# Patient Record
Sex: Male | Born: 1955 | Race: White | Hispanic: No | Marital: Married | Smoking: Former smoker
Health system: Southern US, Community
[De-identification: ages and names within clinical notes are randomized; demographics above are authoritative.]

## PROBLEM LIST (undated history)

## (undated) DIAGNOSIS — E119 Type 2 diabetes mellitus without complications: Secondary | ICD-10-CM

## (undated) DIAGNOSIS — D638 Anemia in other chronic diseases classified elsewhere: Secondary | ICD-10-CM

## (undated) DIAGNOSIS — I482 Chronic atrial fibrillation, unspecified: Secondary | ICD-10-CM

## (undated) DIAGNOSIS — I2510 Atherosclerotic heart disease of native coronary artery without angina pectoris: Secondary | ICD-10-CM

## (undated) DIAGNOSIS — K551 Chronic vascular disorders of intestine: Secondary | ICD-10-CM

## (undated) DIAGNOSIS — G4733 Obstructive sleep apnea (adult) (pediatric): Secondary | ICD-10-CM

## (undated) DIAGNOSIS — E1159 Type 2 diabetes mellitus with other circulatory complications: Secondary | ICD-10-CM

## (undated) DIAGNOSIS — E1169 Type 2 diabetes mellitus with other specified complication: Secondary | ICD-10-CM

## (undated) DIAGNOSIS — I152 Hypertension secondary to endocrine disorders: Secondary | ICD-10-CM

---

## 2010-10-25 ENCOUNTER — Encounter: Payer: Self-pay | Admitting: Family Medicine

## 2010-10-25 ENCOUNTER — Inpatient Hospital Stay (INDEPENDENT_AMBULATORY_CARE_PROVIDER_SITE_OTHER)
Admission: RE | Admit: 2010-10-25 | Discharge: 2010-10-25 | Disposition: A | Discharge status: Home / Self Care | Payer: PRIVATE HEALTH INSURANCE | Source: Ambulatory Visit | Attending: Family Medicine | Admitting: Family Medicine

## 2010-10-25 DIAGNOSIS — Z23 Encounter for immunization: Secondary | ICD-10-CM

## 2010-10-25 DIAGNOSIS — S61209A Unspecified open wound of unspecified finger without damage to nail, initial encounter: Secondary | ICD-10-CM

## 2010-11-04 ENCOUNTER — Encounter: Payer: Self-pay | Admitting: Family Medicine

## 2010-11-04 ENCOUNTER — Inpatient Hospital Stay (INDEPENDENT_AMBULATORY_CARE_PROVIDER_SITE_OTHER)
Admission: RE | Admit: 2010-11-04 | Discharge: 2010-11-04 | Disposition: A | Discharge status: Home / Self Care | Payer: PRIVATE HEALTH INSURANCE | Source: Ambulatory Visit | Attending: Family Medicine | Admitting: Family Medicine

## 2010-11-04 DIAGNOSIS — S61209A Unspecified open wound of unspecified finger without damage to nail, initial encounter: Secondary | ICD-10-CM | POA: Insufficient documentation

## 2010-12-28 NOTE — Progress Notes (Signed)
Summary: SUTURE REMOVAL...WSE   Vital Signs:  Patient Profile:   55 Years Old Male CC:      suture removal Height:     72 inches O2 Sat:      96 % O2 treatment:    Room Air Temp:     98.2 degrees F oral Pulse rate:   84 / minute Resp:     16 per minute BP sitting:   121 / 83  (left arm) Cuff size:   large  Vitals Entered By: Lajean Saver RN (November 04, 2010 5:30 PM)                  Prior Medication List:  * COUMADIN 5MG  daily   Current Allergies: No known allergies History of Present Illness Chief Complaint: suture removal History of Present Illness: Sutuere recheck and removal.  Current Problems: LACERATION, RIGHT THUMB (ICD-883.0) LACERATION, RIGHT THUMB (ICD-883.0)   Current Meds * COUMADIN 5MG  daily  REVIEW OF SYSTEMS Constitutional Symptoms      Denies fever, chills, night sweats, weight loss, weight gain, and fatigue.  Eyes       Denies change in vision, eye pain, eye discharge, glasses, contact lenses, and eye surgery. Ear/Nose/Throat/Mouth       Denies hearing loss/aids, change in hearing, ear pain, ear discharge, dizziness, frequent runny nose, frequent nose bleeds, sinus problems, sore throat, hoarseness, and tooth pain or bleeding.  Respiratory       Denies dry cough, productive cough, wheezing, shortness of breath, asthma, bronchitis, and emphysema/COPD.  Cardiovascular       Denies murmurs, chest pain, and tires easily with exhertion.    Gastrointestinal       Denies stomach pain, nausea/vomiting, diarrhea, constipation, blood in bowel movements, and indigestion. Genitourniary       Denies painful urination, blood or discharge from penis, kidney stones, and loss of urinary control. Neurological       Denies paralysis, seizures, and fainting/blackouts. Musculoskeletal       Denies muscle pain, joint pain, joint stiffness, decreased range of motion, redness, swelling, muscle weakness, and gout.  Skin       Denies bruising, unusual mles/lumps  or sores, and hair/skin or nail changes.  Psych       Denies mood changes, temper/anger issues, anxiety/stress, speech problems, depression, and sleep problems. Other Comments: Sutures removed from right thumb   Past History:  Social History: Last updated: 10/25/2010 Current Smoker Alcohol use-no Drug use-no  Risk Factors: Smoking Status: current (10/25/2010)  Past Medical History: Reviewed history from 10/25/2010 and no changes required. A fib cvhd embolism heart stint (1)  Past Surgical History: Reviewed history from 10/25/2010 and no changes required. leg surgery for embolism  Family History: Reviewed history from 10/25/2010 and no changes required. denies  Social History: Reviewed history from 10/25/2010 and no changes required. Current Smoker Alcohol use-no Drug use-no Physical Exam General appearance: well developed, well nourished, no acute distress Skin: healing sutures in R thumb Assessment Problems:   LACERATION, RIGHT THUMB (ICD-883.0) New Problems: LACERATION, RIGHT THUMB (ICD-883.0)   Plan New Orders: No Charge Patient Arrived (NCPA0) [NCPA0] Follow Up: Follow up on an as needed basis  The patient and/or caregiver has been counseled thoroughly with regard to medications prescribed including dosage, schedule, interactions, rationale for use, and possible side effects and they verbalize understanding.  Diagnoses and expected course of recovery discussed and will return if not improved as expected or if the condition worsens. Patient and/or caregiver verbalized  understanding.   PROCEDURE:  Suture Removal Site: R thumb Sutures removed without difficulty. No drainage from incision.  Patient Instructions: 1)  Please schedule a follow-up appointment as needed. 2)  Please schedule an appointment with your primary doctor in :as needed 3)  Tobacco is very bad for your health and your loved ones! You Should stop smoking!. 4)  Stop Smoking Tips:  Choose a Quit date. Cut down before the Quit date. decide what you will do as a substitute when you feel the urge to smoke(gum,toothpick,exercise).  Orders Added: 1)  No Charge Patient Arrived (NCPA0) [NCPA0]

## 2010-12-28 NOTE — Progress Notes (Signed)
Summary: CUT ON THUMB   Vital Signs:  Patient Profile:   55 Years Old Male CC:      right thumb laceration Height:     72 inches Weight:      335 pounds O2 Sat:      95 % O2 treatment:    Room Air Temp:     97.8 degrees F oral Pulse rate:   88 / minute Resp:     18 per minute BP sitting:   144 / 81  (right arm) Cuff size:   large  Pt. in pain?   yes  Vitals Entered By: Lavell Islam RN (October 25, 2010 6:26 PM)                   Updated Prior Medication List: * COUMADIN 5MG  daily  Current Allergies: No known allergies History of Present Illness Chief Complaint: right thumb laceration History of Present Illness:  Subjective:  Patient complains of cutting his right thumb tip while peeling vegetables this afternoon.  Does not remember his last tetanus shot.  Takes Coumadin for Atrial fib.  REVIEW OF SYSTEMS Constitutional Symptoms      Denies fever, chills, night sweats, weight loss, weight gain, and fatigue.  Eyes       Denies change in vision, eye pain, eye discharge, glasses, contact lenses, and eye surgery. Ear/Nose/Throat/Mouth       Denies hearing loss/aids, change in hearing, ear pain, ear discharge, dizziness, frequent runny nose, frequent nose bleeds, sinus problems, sore throat, hoarseness, and tooth pain or bleeding.  Respiratory       Denies dry cough, productive cough, wheezing, shortness of breath, asthma, bronchitis, and emphysema/COPD.  Cardiovascular       Denies murmurs, chest pain, and tires easily with exhertion.    Gastrointestinal       Denies stomach pain, nausea/vomiting, diarrhea, constipation, blood in bowel movements, and indigestion. Genitourniary       Denies painful urination, kidney stones, and loss of urinary control. Neurological       Denies paralysis, seizures, and fainting/blackouts. Musculoskeletal       Denies muscle pain, joint pain, joint stiffness, decreased range of motion, redness, swelling, muscle weakness, and  gout.  Skin       Denies bruising, unusual mles/lumps or sores, and hair/skin or nail changes.  Psych       Denies mood changes, temper/anger issues, anxiety/stress, speech problems, depression, and sleep problems. Other Comments: lacerated right thumb with potato peeler   Past History:  Past Medical History: A fib cvhd embolism heart stint (1)  Past Surgical History: leg surgery for embolism  Family History: denies  Social History: Current Smoker Alcohol use-no Drug use-no Smoking Status:  current Drug Use:  no   Objective:  No acute distress  Right thumb tip:  1cm superficial skin flap at tip.  Wound clean without debris.  Distal neurovascular intact  Assessment New Problems: LACERATION, RIGHT THUMB (ICD-883.0)   Plan New Orders: Tdap => 67yrs IM [90715] Repair Superficial Wound(s) 2.5cm or <(scalp,neck,axillae,ext gent,trunk,extre) [12001] Admin 1st Vaccine [90471] New Patient Level III [99203] Services provided After hours-Weekends-Holidays [99051] Planning Comments:   Tdap administered Change bandage daily and apply Bacitracin ointment.  Return 10 days for suture removal. Return earlier for any signs of infection.   The patient and/or caregiver has been counseled thoroughly with regard to medications prescribed including dosage, schedule, interactions, rationale for use, and possible side effects and they verbalize understanding.  Diagnoses and  expected course of recovery discussed and will return if not improved as expected or if the condition worsens. Patient and/or caregiver verbalized understanding.   PROCEDURE:  Suture Site: Right thumb Size: 1cm flap Number of Lacerations: One Anesthesia: 0.5% bupivacaine Procedure: Procedure:  Laceration Repair Discussed benefits and risks of procedure and verbal consent obtained. Using sterile technique and digital 0.5% bupivacaine without epinephrine, cleansed wound with Betadine followed by copious lavage  with normal saline.  Wound carefully inspected for debris and foreign bodies; none found.  Wound closed with #5 , 5-0 interrupted nylon sutures.  Bacitracin and non-stick sterile compression dressing applied.  Wound precautions explained to patient.    Disposition: Home  Orders Added: 1)  Tdap => 71yrs IM [90715] 2)  Repair Superficial Wound(s) 2.5cm or <(scalp,neck,axillae,ext gent,trunk,extre) [12001] 3)  Admin 1st Vaccine [90471] 4)  New Patient Level III [40981] 5)  Services provided After hours-Weekends-Holidays [99051]   Immunizations Administered:  Tetanus Vaccine:    Vaccine Type: Tdap    Site: left deltoid    Mfr: boostrix    Dose: 0.5 ml    Route: IM    Given by: Lavell Islam RN    Exp. Date: 12/19/2011    Lot #: [id number]    VIS given: 12/13/07 version given October 25, 2010.   Immunizations Administered:  Tetanus Vaccine:    Vaccine Type: Tdap    Site: left deltoid    Mfr: boostrix    Dose: 0.5 ml    Route: IM    Given by: Lavell Islam RN    Exp. Date: 12/19/2011    Lot #: [id number]    VIS given: 12/13/07 version given October 25, 2010.

## 2011-01-05 DIAGNOSIS — G4730 Sleep apnea, unspecified: Secondary | ICD-10-CM | POA: Insufficient documentation

## 2011-01-05 DIAGNOSIS — G4733 Obstructive sleep apnea (adult) (pediatric): Secondary | ICD-10-CM | POA: Insufficient documentation

## 2011-11-02 DIAGNOSIS — I2510 Atherosclerotic heart disease of native coronary artery without angina pectoris: Secondary | ICD-10-CM | POA: Diagnosis present

## 2011-11-02 DIAGNOSIS — I10 Essential (primary) hypertension: Secondary | ICD-10-CM | POA: Diagnosis present

## 2011-11-02 DIAGNOSIS — I4891 Unspecified atrial fibrillation: Secondary | ICD-10-CM | POA: Diagnosis present

## 2019-04-16 DIAGNOSIS — K8500 Idiopathic acute pancreatitis without necrosis or infection: Secondary | ICD-10-CM | POA: Insufficient documentation

## 2019-06-10 DIAGNOSIS — E785 Hyperlipidemia, unspecified: Secondary | ICD-10-CM | POA: Diagnosis present

## 2019-06-10 DIAGNOSIS — F419 Anxiety disorder, unspecified: Secondary | ICD-10-CM | POA: Diagnosis present

## 2019-06-10 DIAGNOSIS — D638 Anemia in other chronic diseases classified elsewhere: Secondary | ICD-10-CM | POA: Diagnosis present

## 2019-06-10 DIAGNOSIS — E1169 Type 2 diabetes mellitus with other specified complication: Secondary | ICD-10-CM | POA: Diagnosis present

## 2020-03-07 DIAGNOSIS — Z8719 Personal history of other diseases of the digestive system: Secondary | ICD-10-CM | POA: Insufficient documentation

## 2020-04-23 DIAGNOSIS — K551 Chronic vascular disorders of intestine: Secondary | ICD-10-CM | POA: Diagnosis present

## 2020-05-08 DIAGNOSIS — K651 Peritoneal abscess: Secondary | ICD-10-CM | POA: Insufficient documentation

## 2020-06-29 DIAGNOSIS — R269 Unspecified abnormalities of gait and mobility: Secondary | ICD-10-CM | POA: Insufficient documentation

## 2020-07-14 DIAGNOSIS — I728 Aneurysm of other specified arteries: Secondary | ICD-10-CM | POA: Insufficient documentation

## 2020-07-25 ENCOUNTER — Other Ambulatory Visit: Payer: Self-pay | Admitting: Interventional Radiology

## 2020-07-25 ENCOUNTER — Ambulatory Visit
Admission: RE | Admit: 2020-07-25 | Discharge: 2020-07-25 | Disposition: A | Discharge status: Home / Self Care | Payer: PRIVATE HEALTH INSURANCE | Source: Ambulatory Visit | Attending: Anesthesiology | Admitting: Anesthesiology

## 2020-07-25 ENCOUNTER — Other Ambulatory Visit: Payer: Self-pay

## 2020-07-25 ENCOUNTER — Other Ambulatory Visit: Payer: Self-pay | Admitting: Anesthesiology

## 2020-07-25 DIAGNOSIS — I728 Aneurysm of other specified arteries: Secondary | ICD-10-CM

## 2020-07-25 HISTORY — PX: IR RADIOLOGIST EVAL & MGMT: IMG5224

## 2020-07-25 NOTE — Consult Note (Addendum)
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    He presents via telephone visit, accompanied by his daughter, Emogene Morgan.  Referring Physician(s): Sharma,Rachana  Patient Status: [facility] - Out-pt  History of Present Illness: Benjamin Fox is a 65 y.o. male with pertinent past medical history of mesenteric ischemia which prompted celiac and SMA stent placements on April 01, 2020 at [facility] by Vascular Surgery (Dr. Renard Matter).  His pain temporarily improved, then worsened soon after.  Further imaging demonstrated development of acute pancreatitis and thrombosis of both stents.  His pancreatitis was complicated by acute pancreatic fluid collections which evolved into walled-off necrosis.  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  Angiogram was attempted by [facility] from radial and femoral approaches without success identifying supply to the pseudoaneurysm from IMA/Arc of Riolan catheterization nor cannulation of the indwelling occluded celiac and SMA stents.  Given walled off necrosis, Gastroenterology is hesitant to intervene or drain endoscopically due to presence of pseudoaneurysm.  Given history of atrial fibrillation, he has been on anticoagulation for years, formerly coumadin, more recently Xarelto.  This was discontinued at recent hospital discharge due to development of the pseudoaneurysm, and he is now on 325 mg aspirin daily.    He states that over the past week he has been feeling gradually better.  He has persistent upper abdominal pain, but this is significantly less than while hospitalized.  His appetite is returning and he states he's able to tolerate all of the food he's tried thus far.  No nausea, vomiting, chest pain, cough, loss of  consciousness.  Additional past medical history: Atrial fibrillation OSA Leg compartment syndrome, residual drop foot (remote) CAD s/p PCI 2008 HTN - controlled with medications Hx of smoking, stopped several years ago    No past medical history on file.  Allergies: Patient has no allergy information on record.  Medications: Prior to Admission medications   Not on File     No family history on file.  Social History   Socioeconomic History   Marital status: Married    Spouse name: Not on file   Number of children: Not on file   Years of education: Not on file   Highest education level: Not on file  Occupational History   Not on file  Tobacco Use   Smoking status: Not on file   Smokeless tobacco: Not on file  Substance and Sexual Activity   Alcohol use: Not on file   Drug use: Not on file   Sexual activity: Not on file  Other Topics Concern   Not on file  Social History Narrative   Not on file   Social Determinants of Health   Financial Resource Strain: Not on file  Food Insecurity: Not on file  Transportation Needs: Not on file  Physical Activity: Not on file  Stress: Not on file  Social Connections: Not on file     Review of Systems: A 12 point ROS discussed and pertinent positives are indicated in the HPI above.  All other systems are negative.   Vital Signs: There were no vitals taken for this visit.  No physical examination performed in lieu of telephone visit.  Imaging: CTA 07/10/20     Labs:  CBC: No results for input(s): WBC, HGB, HCT, PLT in the last 8760 hours.  COAGS: No  results for input(s): INR, APTT in the last 8760 hours.  BMP: No results for input(s): NA, K, CL, CO2, GLUCOSE, BUN, CALCIUM, CREATININE, GFRNONAA, GFRAA in the last 8760 hours.  Invalid input(s): CMP  LIVER FUNCTION TESTS: No results for input(s): BILITOT, AST, ALT, ALKPHOS, PROT, ALBUMIN in the last 8760 hours.  TUMOR MARKERS: No results for input(s):  AFPTM, CEA, CA199, CHROMGRNA in the last 8760 hours.  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  Prior attempts at recanalizing the occluded stents as well as accessing the pseudoaneurysm via the Arc of Riolan were unsuccessful.    Given clinical improvement, I would like to get another CTA to evaluate any change in the pseudoaneurysm and pancreatic necrosis.  If spontaneous occlusion of the pseudoaneurysm has occurred, no intervention will be necessary.  If the pseudoaneurysm persists or has enlarged, attempt at angiogram and embolization could be pursued.  The patient and his daughter are in agreement with this plan.  Once the CTA is completed, I will review images and contact the patient to discuss the next best steps.    ADDENDUM (07/29/20): CTA done - there is complete resolution of pseudoaneurysm.  Will plan to follow up in clinic in 1 month.  If there is persistent or worsening abdominal pain, will entertain stent recan/relining.  Thank you for this interesting consult.  I greatly enjoyed meeting Benjamin Fox and look forward to participating in their care.  A copy of this report was sent to the requesting provider on this date.  Electronically Signed: Suzette Battiest, MD 07/25/2020, 10:44 AM   I spent a total of  80 Minutes  in telephone clinical consultation, greater than 50% of which was counseling/coordinating care for pancreatic pseudoaneurysm.

## 2020-07-29 ENCOUNTER — Ambulatory Visit (INDEPENDENT_AMBULATORY_CARE_PROVIDER_SITE_OTHER): Payer: BC Managed Care – PPO

## 2020-07-29 ENCOUNTER — Other Ambulatory Visit: Payer: Self-pay

## 2020-07-29 DIAGNOSIS — I728 Aneurysm of other specified arteries: Secondary | ICD-10-CM | POA: Diagnosis not present

## 2020-07-29 MED ORDER — IOHEXOL 350 MG/ML SOLN
100.0000 mL | Freq: Once | INTRAVENOUS | Status: AC | PRN
  Administered 2020-07-29: 100 mL via INTRAVENOUS

## 2020-08-04 ENCOUNTER — Other Ambulatory Visit: Payer: Self-pay | Admitting: Interventional Radiology

## 2020-08-04 DIAGNOSIS — K863 Pseudocyst of pancreas: Secondary | ICD-10-CM

## 2020-08-04 DIAGNOSIS — K551 Chronic vascular disorders of intestine: Secondary | ICD-10-CM

## 2020-08-28 ENCOUNTER — Encounter: Payer: Self-pay | Admitting: *Deleted

## 2020-08-28 ENCOUNTER — Other Ambulatory Visit: Payer: Self-pay

## 2020-08-28 ENCOUNTER — Ambulatory Visit
Admission: RE | Admit: 2020-08-28 | Discharge: 2020-08-28 | Disposition: A | Discharge status: Home / Self Care | Payer: BC Managed Care – PPO | Source: Ambulatory Visit | Attending: Interventional Radiology | Admitting: Interventional Radiology

## 2020-08-28 DIAGNOSIS — K863 Pseudocyst of pancreas: Secondary | ICD-10-CM

## 2020-08-28 DIAGNOSIS — K551 Chronic vascular disorders of intestine: Secondary | ICD-10-CM

## 2020-08-28 HISTORY — PX: IR RADIOLOGIST EVAL & MGMT: IMG5224

## 2020-08-28 NOTE — Progress Notes (Signed)
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  Prior attempts at recanalizing the occluded stents as well as accessing the pseudoaneurysm via the Arc of Riolan were unsuccessful.    Repeat CTA was performed on 07/29/20 which demonstrated resolution of pseudoaneurysm, improvement in walled-off necrosis and pancreatitis, with persistent occlusion of recently placed indwelling celiac and SMA stents.  Decision was made at that time to see how his abdominal pain and food tolerance progressed over the next month to help direct attempt at recanalization of the indwelling stents versus continued watchful waiting.  He presents today via virtual telephone clinic visit accompanied by his daughter, Benjamin Fox.    He endorses persistent stomach pain, not as bad as initial pain, which occurs approximately 80% of the time after eating, and always after eating fried foods.  He rates the pain as a 2-4/10.  He has not had to take any pain medication.  The pain resolves approximately 45 minutes to 1 hour after eating.  He has been able to tolerate food and has gained back about 15 lbs which he is happy with.  He states the pain is bearable, but annoying.  He is wary of it getting worse.  He has not had follow up with Gastroenterology or Vascular Surgery since his discharge from the hospital.  He continues to take aspirin, 81 mg daily.       No past medical history on file.   Allergies: Patient has no allergy information on record.  Medications: Prior to Admission medications   Not on File     No family history on file.  Social History    Socioeconomic History   Marital status: Married    Spouse name: Not on file   Number of children: Not on file   Years of education: Not on file   Highest education level: Not on file  Occupational History   Not on file  Tobacco Use   Smoking status: Not on file   Smokeless tobacco: Not on file  Substance and Sexual Activity   Alcohol use: Not on file   Drug use: Not on file   Sexual activity: Not on file  Other Topics Concern   Not on file  Social History Narrative   Not on file   Social Determinants of Health   Financial Resource Strain: Not on file  Food Insecurity: Not on file  Transportation Needs: Not on file  Physical Activity: Not on file  Stress: Not on file  Social Connections: Not on file     Vital Signs: There were no vitals taken for this visit.  No physical examination performed in lieu of telephone visit.  Imaging:  Celiac  = Express 6x27 mm SMA = Express 7x37 mm   Labs:  CBC: No results for input(s): WBC, HGB, HCT, PLT in the last 8760 hours.  COAGS: No results for input(s): INR, APTT in the last 8760 hours.  BMP: No results for input(s): NA, K, CL, CO2, GLUCOSE, BUN, CALCIUM, CREATININE, GFRNONAA, GFRAA in the last 8760 hours.  Invalid input(s): CMP  LIVER FUNCTION TESTS: No results for input(s): BILITOT, AST, ALT, ALKPHOS, PROT, ALBUMIN in the last 8760 hours.  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  Prior attempts at recanalizing the occluded stents as well as accessing the pseudoaneurysm via the Arc of Riolan were unsuccessful.  Follow up CTA demonstrated spontaneous resolution of the pseudoaneurysm and improvement of pancreatitis complications.    He has had improvement  in abdominal pain his initial presentation, but continues to have consistent post-prandial pain.  Given stenotic put patent IMA supplying the entire viscera, I am concerned his prognosis for sustained patency is grim.  We discussed options including continued watchful waiting in addition to attempt at recanalization of the SMA stent.  He is hopeful to stay ahead of worsening ischemia, and is amenable to stent recanlization.  Given anatomy of patent SMA just beyond distal aspect of stent, and favorable angulation with minimal aortic overhang, I think it is reasonable to attempt stent recanalization.  This may require advanced techniques including laser atherectomy to obtain a patent channel.  If able to recanalize, will then plan to place covered stent.    Plan for mesenteric angiogram with SMA stent recanalization at [facility].  Plan for moderate sedation and overnight observation.  Will need to obtain CTA abdomen and pelvis approximately 1 week prior to procedure.     Electronically Signed: Suzette Fox 08/28/2020, 8:15 AM   I spent a total of 40 Minutes in telephone clinical consultation, greater than 50% of which was counseling/coordinating care for chronic mesenteric ischemia.

## 2020-08-29 ENCOUNTER — Other Ambulatory Visit: Payer: Self-pay | Admitting: Physician Assistant

## 2020-09-03 ENCOUNTER — Other Ambulatory Visit: Payer: Self-pay | Admitting: Interventional Radiology

## 2020-09-03 DIAGNOSIS — K551 Chronic vascular disorders of intestine: Secondary | ICD-10-CM

## 2020-09-08 ENCOUNTER — Other Ambulatory Visit: Payer: Self-pay

## 2020-09-08 ENCOUNTER — Ambulatory Visit (INDEPENDENT_AMBULATORY_CARE_PROVIDER_SITE_OTHER): Payer: BC Managed Care – PPO

## 2020-09-08 DIAGNOSIS — K551 Chronic vascular disorders of intestine: Secondary | ICD-10-CM

## 2020-09-08 LAB — I-STAT CREATININE (MANUAL ENTRY): Creatinine, Ser: 0.7 (ref 0.50–1.10)

## 2020-09-08 MED ORDER — IOHEXOL 300 MG/ML  SOLN
100.0000 mL | Freq: Once | INTRAMUSCULAR | Status: AC | PRN
  Administered 2020-09-08: 100 mL via INTRAVENOUS

## 2020-09-09 ENCOUNTER — Other Ambulatory Visit (HOSPITAL_COMMUNITY): Payer: Self-pay | Admitting: Interventional Radiology

## 2020-09-09 DIAGNOSIS — K559 Vascular disorder of intestine, unspecified: Secondary | ICD-10-CM

## 2020-09-10 ENCOUNTER — Inpatient Hospital Stay (HOSPITAL_COMMUNITY)
Admission: EM | Admit: 2020-09-10 | Discharge: 2020-09-12 | DRG: 372 | Disposition: A | Discharge status: Home / Self Care | Payer: BC Managed Care – PPO | Attending: Internal Medicine | Admitting: Internal Medicine

## 2020-09-10 ENCOUNTER — Encounter (HOSPITAL_COMMUNITY): Payer: Self-pay

## 2020-09-10 ENCOUNTER — Other Ambulatory Visit: Payer: Self-pay

## 2020-09-10 ENCOUNTER — Emergency Department (HOSPITAL_COMMUNITY): Payer: BC Managed Care – PPO

## 2020-09-10 ENCOUNTER — Emergency Department: Payer: Self-pay

## 2020-09-10 ENCOUNTER — Other Ambulatory Visit: Payer: Self-pay | Admitting: Surgery

## 2020-09-10 DIAGNOSIS — I152 Hypertension secondary to endocrine disorders: Secondary | ICD-10-CM | POA: Diagnosis present

## 2020-09-10 DIAGNOSIS — Z7982 Long term (current) use of aspirin: Secondary | ICD-10-CM

## 2020-09-10 DIAGNOSIS — I2510 Atherosclerotic heart disease of native coronary artery without angina pectoris: Secondary | ICD-10-CM | POA: Diagnosis present

## 2020-09-10 DIAGNOSIS — E785 Hyperlipidemia, unspecified: Secondary | ICD-10-CM | POA: Diagnosis present

## 2020-09-10 DIAGNOSIS — G4733 Obstructive sleep apnea (adult) (pediatric): Secondary | ICD-10-CM | POA: Diagnosis present

## 2020-09-10 DIAGNOSIS — E1169 Type 2 diabetes mellitus with other specified complication: Secondary | ICD-10-CM | POA: Diagnosis present

## 2020-09-10 DIAGNOSIS — K863 Pseudocyst of pancreas: Secondary | ICD-10-CM | POA: Diagnosis present

## 2020-09-10 DIAGNOSIS — Z7984 Long term (current) use of oral hypoglycemic drugs: Secondary | ICD-10-CM

## 2020-09-10 DIAGNOSIS — E119 Type 2 diabetes mellitus without complications: Secondary | ICD-10-CM | POA: Diagnosis present

## 2020-09-10 DIAGNOSIS — I10 Essential (primary) hypertension: Secondary | ICD-10-CM | POA: Diagnosis present

## 2020-09-10 DIAGNOSIS — E876 Hypokalemia: Secondary | ICD-10-CM | POA: Diagnosis present

## 2020-09-10 DIAGNOSIS — R7303 Prediabetes: Secondary | ICD-10-CM | POA: Diagnosis present

## 2020-09-10 DIAGNOSIS — K353 Acute appendicitis with localized peritonitis, without perforation or gangrene: Secondary | ICD-10-CM | POA: Diagnosis not present

## 2020-09-10 DIAGNOSIS — Z955 Presence of coronary angioplasty implant and graft: Secondary | ICD-10-CM

## 2020-09-10 DIAGNOSIS — I119 Hypertensive heart disease without heart failure: Secondary | ICD-10-CM | POA: Diagnosis present

## 2020-09-10 DIAGNOSIS — D638 Anemia in other chronic diseases classified elsewhere: Secondary | ICD-10-CM | POA: Diagnosis present

## 2020-09-10 DIAGNOSIS — Z6841 Body Mass Index (BMI) 40.0 and over, adult: Secondary | ICD-10-CM

## 2020-09-10 DIAGNOSIS — F419 Anxiety disorder, unspecified: Secondary | ICD-10-CM | POA: Diagnosis present

## 2020-09-10 DIAGNOSIS — K3580 Unspecified acute appendicitis: Secondary | ICD-10-CM

## 2020-09-10 DIAGNOSIS — I482 Chronic atrial fibrillation, unspecified: Secondary | ICD-10-CM | POA: Diagnosis present

## 2020-09-10 DIAGNOSIS — I771 Stricture of artery: Secondary | ICD-10-CM | POA: Diagnosis present

## 2020-09-10 DIAGNOSIS — Z79899 Other long term (current) drug therapy: Secondary | ICD-10-CM

## 2020-09-10 DIAGNOSIS — I4891 Unspecified atrial fibrillation: Secondary | ICD-10-CM | POA: Diagnosis present

## 2020-09-10 DIAGNOSIS — K3533 Acute appendicitis with perforation and localized peritonitis, with abscess: Secondary | ICD-10-CM

## 2020-09-10 DIAGNOSIS — K551 Chronic vascular disorders of intestine: Secondary | ICD-10-CM | POA: Diagnosis present

## 2020-09-10 DIAGNOSIS — Z20822 Contact with and (suspected) exposure to covid-19: Secondary | ICD-10-CM | POA: Diagnosis present

## 2020-09-10 LAB — COMPREHENSIVE METABOLIC PANEL
ALT: 10 U/L (ref 0–44)
AST: 12 U/L — ABNORMAL LOW (ref 15–41)
Albumin: 2.9 g/dL — ABNORMAL LOW (ref 3.5–5.0)
Alkaline Phosphatase: 175 U/L — ABNORMAL HIGH (ref 38–126)
Anion gap: 10 (ref 5–15)
BUN: 12 mg/dL (ref 8–23)
CO2: 26 mmol/L (ref 22–32)
Calcium: 8.8 mg/dL — ABNORMAL LOW (ref 8.9–10.3)
Chloride: 101 mmol/L (ref 98–111)
Creatinine, Ser: 0.58 mg/dL — ABNORMAL LOW (ref 0.61–1.24)
GFR, Estimated: >60 mL/min (ref >60)
Glucose, Bld: 87 mg/dL (ref 70–99)
Potassium: 3.2 mmol/L — ABNORMAL LOW (ref 3.5–5.1)
Sodium: 137 mmol/L (ref 135–145)
Total Bilirubin: 0.6 mg/dL (ref 0.3–1.2)
Total Protein: 6.5 g/dL (ref 6.5–8.1)

## 2020-09-10 LAB — CBC WITH DIFFERENTIAL/PLATELET
Abs Immature Granulocytes: 0.03 10*3/uL (ref 0.00–0.07)
Basophils Absolute: 0 10*3/uL (ref 0.0–0.1)
Basophils Relative: 0 %
Eosinophils Absolute: 0.1 10*3/uL (ref 0.0–0.5)
Eosinophils Relative: 1 %
HCT: 28.8 % — ABNORMAL LOW (ref 39.0–52.0)
Hemoglobin: 8.8 g/dL — ABNORMAL LOW (ref 13.0–17.0)
Immature Granulocytes: 0 %
Lymphocytes Relative: 24 %
Lymphs Abs: 1.7 10*3/uL (ref 0.7–4.0)
MCH: 28.3 pg (ref 26.0–34.0)
MCHC: 30.6 g/dL (ref 30.0–36.0)
MCV: 92.6 fL (ref 80.0–100.0)
Monocytes Absolute: 0.5 10*3/uL (ref 0.1–1.0)
Monocytes Relative: 8 %
Neutro Abs: 4.5 10*3/uL (ref 1.7–7.7)
Neutrophils Relative %: 67 %
Platelets: 258 10*3/uL (ref 150–400)
RBC: 3.11 MIL/uL — ABNORMAL LOW (ref 4.22–5.81)
RDW: 15.5 % (ref 11.5–15.5)
WBC: 6.8 10*3/uL (ref 4.0–10.5)
nRBC: 0 % (ref 0.0–0.2)

## 2020-09-10 LAB — MAGNESIUM: Magnesium: 1.6 mg/dL — ABNORMAL LOW (ref 1.7–2.4)

## 2020-09-10 LAB — RESP PANEL BY RT-PCR (FLU A&B, COVID) ARPGX2
Influenza A by PCR: NEGATIVE
Influenza B by PCR: NEGATIVE
SARS Coronavirus 2 by RT PCR: NEGATIVE

## 2020-09-10 MED ORDER — ATORVASTATIN CALCIUM 10 MG PO TABS
5.0000 mg | ORAL_TABLET | Freq: Every day | ORAL | Status: DC
  Administered 2020-09-11 – 2020-09-12 (×2): 5 mg via ORAL
  Filled 2020-09-10 (×2): qty 1

## 2020-09-10 MED ORDER — SODIUM CHLORIDE 0.9% FLUSH
3.0000 mL | Freq: Two times a day (BID) | INTRAVENOUS | Status: DC
  Administered 2020-09-10 – 2020-09-11 (×2): 3 mL via INTRAVENOUS

## 2020-09-10 MED ORDER — ADULT MULTIVITAMIN W/MINERALS CH
1.0000 | ORAL_TABLET | Freq: Every day | ORAL | Status: DC
  Administered 2020-09-11 – 2020-09-12 (×2): 1 via ORAL
  Filled 2020-09-10 (×2): qty 1

## 2020-09-10 MED ORDER — ACETAMINOPHEN 650 MG RE SUPP: 650.0000 mg | Freq: Four times a day (QID) | RECTAL | Status: DC | PRN

## 2020-09-10 MED ORDER — ACETAMINOPHEN 325 MG PO TABS: 650.0000 mg | ORAL_TABLET | Freq: Four times a day (QID) | ORAL | Status: DC | PRN

## 2020-09-10 MED ORDER — DILTIAZEM HCL ER COATED BEADS 120 MG PO CP24
240.0000 mg | ORAL_CAPSULE | Freq: Every day | ORAL | Status: DC
  Administered 2020-09-11 – 2020-09-12 (×2): 240 mg via ORAL
  Filled 2020-09-10: qty 1
  Filled 2020-09-10 (×2): qty 2

## 2020-09-10 MED ORDER — FENTANYL CITRATE PF 50 MCG/ML IJ SOSY
50.0000 ug | PREFILLED_SYRINGE | Freq: Once | INTRAMUSCULAR | Status: AC
  Administered 2020-09-10: 50 ug via INTRAVENOUS
  Filled 2020-09-10: qty 1

## 2020-09-10 MED ORDER — CARVEDILOL 12.5 MG PO TABS
12.5000 mg | ORAL_TABLET | Freq: Two times a day (BID) | ORAL | Status: DC
  Administered 2020-09-10 – 2020-09-12 (×4): 12.5 mg via ORAL
  Filled 2020-09-10: qty 4
  Filled 2020-09-10 (×3): qty 1

## 2020-09-10 MED ORDER — SODIUM CHLORIDE 0.9% FLUSH: 3.0000 mL | INTRAVENOUS | Status: DC | PRN

## 2020-09-10 MED ORDER — SODIUM CHLORIDE 0.9 % IV SOLN: 250.0000 mL | INTRAVENOUS | Status: DC | PRN

## 2020-09-10 MED ORDER — SERTRALINE HCL 100 MG PO TABS
100.0000 mg | ORAL_TABLET | Freq: Every day | ORAL | Status: DC
  Administered 2020-09-11 – 2020-09-12 (×2): 100 mg via ORAL
  Filled 2020-09-10 (×2): qty 1

## 2020-09-10 MED ORDER — PANTOPRAZOLE SODIUM 40 MG PO TBEC
40.0000 mg | DELAYED_RELEASE_TABLET | Freq: Two times a day (BID) | ORAL | Status: DC
  Administered 2020-09-10 – 2020-09-12 (×4): 40 mg via ORAL
  Filled 2020-09-10 (×4): qty 1

## 2020-09-10 MED ORDER — POTASSIUM CHLORIDE CRYS ER 20 MEQ PO TBCR
20.0000 meq | EXTENDED_RELEASE_TABLET | Freq: Once | ORAL | Status: AC
  Administered 2020-09-10: 20 meq via ORAL
  Filled 2020-09-10: qty 1

## 2020-09-10 MED ORDER — ASPIRIN EC 81 MG PO TBEC
81.0000 mg | DELAYED_RELEASE_TABLET | Freq: Every day | ORAL | Status: DC
  Administered 2020-09-11 – 2020-09-12 (×2): 81 mg via ORAL
  Filled 2020-09-10 (×2): qty 1

## 2020-09-10 MED ORDER — OXYCODONE HCL 5 MG PO TABS
5.0000 mg | ORAL_TABLET | ORAL | Status: DC | PRN
  Administered 2020-09-10: 10 mg via ORAL
  Administered 2020-09-10: 5 mg via ORAL
  Administered 2020-09-12: 10 mg via ORAL
  Filled 2020-09-10: qty 1
  Filled 2020-09-10 (×2): qty 2

## 2020-09-10 MED ORDER — MORPHINE SULFATE (PF) 2 MG/ML IV SOLN
1.0000 mg | INTRAVENOUS | Status: DC | PRN
Start: 2020-09-10 — End: 2020-09-12
  Administered 2020-09-10 – 2020-09-11 (×3): 1 mg via INTRAVENOUS
  Filled 2020-09-10 (×3): qty 1

## 2020-09-10 MED ORDER — PIPERACILLIN-TAZOBACTAM 3.375 G IVPB 30 MIN
3.3750 g | Freq: Once | INTRAVENOUS | Status: AC
  Administered 2020-09-10: 3.375 g via INTRAVENOUS
  Filled 2020-09-10: qty 50

## 2020-09-10 MED ORDER — MULTI-VITAMIN/MINERALS PO TABS: 1.0000 | ORAL_TABLET | Freq: Every day | ORAL | Status: DC

## 2020-09-10 NOTE — ED Provider Notes (Signed)
[facility] EMERGENCY DEPARTMENT Provider Note   CSN: [id number] Arrival date & time: 09/10/20  1256     History Chief Complaint  Patient presents with   Abdominal Pain    Benjamin Fox is a 65 y.o. male.  HPI 65 year old male presents today with reports of appendicitis.  Patient had CT scan done on Monday after fall which showed appendicitis.  Patient states he has been having abdominal trouble since the end of February.  At that time was found to have pancreatitis and developed a pseudocyst.  He also had a pancreatic artery aneurysm.  He reports multiple surgeries.  He has not noted any new or increasing pain over the past days to weeks.  He states that he had a reversal of a lap band surgery and has been gaining weight.  He reports being seen by Dr. Thermon Leyland today. I reviewed Dr. Robby Sermon note.  He advises that the patient is to be admitted to their service.  He is Discussed care with Dr. Rosendo Gros.  He advises IR consultation. Patient has a history of atrial fibrillation and reports that he was taken off of his anticoagulation medications over a month ago and has not been restarted. He denies any headache, head injury, neck pain, chest pain, dyspnea, cough, vomiting, or diarrhea Patient was seen initially at triage and had CBC, c-Met, COVID test, and Zosyn ordered.    History reviewed. No pertinent past medical history.  Patient Active Problem List   Diagnosis Date Noted   LACERATION, RIGHT THUMB 11/04/2010    Past Surgical History:  Procedure Laterality Date   IR RADIOLOGIST EVAL & MGMT  07/25/2020   IR RADIOLOGIST EVAL & MGMT  08/28/2020       No family history on file.     Home Medications Prior to Admission medications   Medication Sig Start Date End Date Taking? Authorizing Provider  aspirin EC 81 MG tablet Take 81 mg by mouth daily. Swallow whole.    [provider]  atorvastatin (LIPITOR) 20 MG tablet Take 5 mg by mouth  daily. 06/24/20   [provider]  carvedilol (COREG) 12.5 MG tablet Take 12.5 mg by mouth 2 (two) times daily. 06/09/20   [provider]  Cyanocobalamin 5000 MCG SUBL Place 5,000 mcg under the tongue daily. 10/28/15   [provider]  diltiazem (CARDIZEM CD) 240 MG 24 hr capsule Take 240 mg by mouth daily. 07/20/20   [provider]  furosemide (LASIX) 20 MG tablet Take 10 mg by mouth daily. 08/18/15   [provider]  lisinopril (ZESTRIL) 30 MG tablet Take 30 mg by mouth 2 (two) times daily. 07/16/20   [provider]  metFORMIN (GLUCOPHAGE) 1000 MG tablet Take 250 mg by mouth 2 (two) times daily. 06/09/20   [provider]  Multiple Vitamins-Minerals (MULTIVITAMIN WITH MINERALS) tablet Take 1 tablet by mouth daily.    [provider]  pantoprazole (PROTONIX) 40 MG tablet Take 40 mg by mouth 2 (two) times daily. 04/12/20   [provider]  sertraline (ZOLOFT) 100 MG tablet Take 100 mg by mouth daily. 08/05/20   [provider]    Allergies    Patient has no known allergies.  Review of Systems   Review of Systems  All other systems reviewed and are negative.  Physical Exam Updated Vital Signs BP (!) 136/98 (BP Location: Right Arm)   Pulse 86   Temp 98.5 F (36.9 C) (Oral)   Resp 18  SpO2 99%   Physical Exam Vitals reviewed.  Constitutional:      Appearance: He is well-developed.  HENT:     Head: Normocephalic.     Mouth/Throat:     Mouth: Mucous membranes are moist.  Eyes:     Extraocular Movements: Extraocular movements intact.  Cardiovascular:     Rate and Rhythm: Normal rate. Rhythm irregular.  Pulmonary:     Effort: Pulmonary effort is normal.     Breath sounds: Normal breath sounds.  Abdominal:     General: Abdomen is flat. Bowel sounds are normal.     Palpations: Abdomen is soft.     Tenderness: There is abdominal tenderness in the right lower quadrant.  Skin:    General: Skin is  warm and dry.     Capillary Refill: Capillary refill takes less than 2 seconds.  Neurological:     General: No focal deficit present.     Mental Status: He is alert.  Psychiatric:        Mood and Affect: Mood normal.        Behavior: Behavior normal.    ED Results / Procedures / Treatments   Labs (all labs ordered are listed, but only abnormal results are displayed) Labs Reviewed  RESP PANEL BY RT-PCR (FLU A&B, COVID) [id number]  COMPREHENSIVE METABOLIC PANEL  CBC WITH DIFFERENTIAL/PLATELET    EKG EKG Interpretation  Date/Time:  Wednesday September 10 2020 14:36:01 EDT Ventricular Rate:  85 PR Interval:    QRS Duration: 104 QT Interval:  423 QTC Calculation: 503 R Axis:   -41 Text Interpretation: Atrial fibrillation Left axis deviation Prolonged QT interval Confirmed by [name] [phone]) on 09/10/2020 2:38:32 PM  Radiology CT Angio Abd/Pel w/ and/or w/o  Result Date: 09/08/2020 CLINICAL DATA:  Chronic mesenteric ischemia, left-sided abdominal pain EXAM: CTA ABDOMEN AND PELVIS WITH CONTRAST TECHNIQUE: Multidetector CT imaging of the abdomen and pelvis was performed using the standard protocol during bolus administration of intravenous contrast. Multiplanar reconstructed images and MIPs were obtained and reviewed to evaluate the vascular anatomy. CONTRAST:  198m OMNIPAQUE IOHEXOL 300 MG/ML  SOLN COMPARISON:  07/29/2020 FINDINGS: VASCULAR Aorta: Moderate calcified plaque. No aneurysm, dissection, or stenosis. Celiac: Occluded stent across the origin, protrudes approximately 1 cm into the lumen of the aorta. Collateral reconstitution of the trifurcation vessels. SMA: Occluded stent across the origin, protrudes approximately 0.5 cm into the lumen of the aorta. There is angulation of the native vessel just distal to the stent with short-segment occlusion. Collateral reconstitution of the distal SMA which is moderately atheromatous. Renals: Single left, with calcified ostial plaque, no  significant stenosis. Duplicated right, superior dominant, both with proximal short-segment stenoses of at least mild hemodynamic significance, patent distally. IMA: Mild short-segment origin stenosis related to aortic wall plaque, dilated distally with prominent arc of Riolan collateral supply to SMA distribution. Inflow: Moderate calcified plaque, ectatic. No dissection or stenosis. Proximal Outflow: Ectatic, atheromatous, patent. Veins: Tapered narrowing of the splenic vein to the level of the pancreatic tail. Patent IMV, SMV, and portal vein. Patent bilateral renal veins. Review of the MIP images confirms the above findings. NON-VASCULAR Lower chest: No pleural or pericardial effusion. Coronary calcifications. Calcified granuloma in the posterior basal segment right lower lobe. Hepatobiliary: No focal liver abnormality is seen. No gallstones, gallbladder wall thickening, or biliary dilatation. Pancreas: Scattered coarse calcifications in the pancreatic body and head. No discrete mass or ductal dilatation. Spleen: Normal in size.  Small calcified granulomas. Adrenals/Urinary Tract: Adrenal glands unremarkable.  Stable bilateral renal cysts. No hydronephrosis. Urinary bladder incompletely distended. Stomach/Bowel: Stomach is decompressed. Small bowel is nondistended. 3.5 cm poorly marginated phlegmonous process adjacent to the appendix, with mild regional inflammatory/edematous change, new since previous. No discrete abscess. The colon is nondilated, unremarkable. Lymphatic: No abdominal or pelvic adenopathy. Reproductive: Mild prostate enlargement. Other: Bilateral pelvic phleboliths.  No ascites.  No free air. Musculoskeletal: Mild multilevel lumbar spondylitic change. No fracture or worrisome bone lesion. IMPRESSION: 1. Periappendiceal 3.5 cm phlegmonous process with surrounding inflammatory change, new since 07/29/2020, suggesting smoldering appendicitis. No abscess. 2. 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 3. Coronary and Aortic Atherosclerosis (ICD10-170.0). Electronically Signed   By: Lucrezia Europe M.D.   On: 09/08/2020 16:41    Procedures Procedures   Medications Ordered in ED Medications  piperacillin-tazobactam (ZOSYN) IVPB 3.375 g (has no administration in time range)    ED Course  I have reviewed the triage vital signs and the nursing notes.  Pertinent labs & imaging results that were available during my care of the patient were reviewed by me and considered in my medical decision making (see chart for details).  65 year old man who was found to have an appendicitis on CT scan obtained 2 days ago.  He was seen by Dr. Thermon Leyland, advised patient come to the ED for admission.  Here he has a anemia that has been chronic.  Hemoglobin here is 8.  He is in A. fib which again is chronic.  Care has been discussed with general surgery.  They will see in consultation.  His images are being imported.  They have consulted IR who will also see the patient in consultation.  They request that medicine be consulted for admission.  Patient has received his previous care at [facility] and [place] [facility]. Care discussed with general surgery and request that medicine be consulted to admit and manage medial problems   MDM Rules/Calculators/A&P                            Final Clinical Impression(s) / ED Diagnoses Final diagnoses:  Acute appendicitis, unspecified acute appendicitis type    Rx / DC Orders ED Discharge Orders     None        [name], MD 09/15/20 1257

## 2020-09-10 NOTE — Progress Notes (Signed)
Patient evaluated in our clinic by Dr. Thermon Leyland today for Acute appendicitis with localized peritonitis, without perforation or abscess, unspecified whether gangrene present - admit for IV abx and possible IR percutaneous drainage of abscess - radiology read from [facility] with "new complex 4 x 2.5 x 2.5 cm heterogeneous fluid collection with surrounding soft tissue induration and tiny pockets of gas adjacent to the tip of the appendix. This is concerning for a ruptured appendix with abscess formation" - Patient discussed with IR who will evaluate. Have reached out to [facility] and they will powershare patient's CT images from his ED visit there yesterday  FEN: NPO ID: [email or web address] given in the ED VTE: recommend hold chemical prophylaxis for now while awaiting IR evaluation  Recommend admission to medicine service and we will follow

## 2020-09-10 NOTE — ED Triage Notes (Signed)
Pt sent by surgeon for admission for a perforated appendix. Pt seen outpt for appendicitis earlier this week but pt then had a fall yesterday, was seen at [facility] and Ct scans showed a perforated appendix. Pt c.o RLQ pain and right ribcage pain.

## 2020-09-10 NOTE — ED Provider Notes (Signed)
Emergency Medicine Provider Triage Evaluation Note  Benjamin Fox , a 66 y.o. male  was evaluated in triage.  Pt complains of abdominal pain.  Right lower quadrant pain.  Had a fall yesterday, evaluated with CT scans and was told he had acute appendicitis with perforation.  Saw surgery this morning and was sent to the ER to be admitted, IV antibiotics ordered and possible IR drainage.  Last meal was 8 AM this morning and has had water since then.  Review of Systems  Positive: Abdominal pain Negative: Vomiting  Physical Exam  BP (!) 136/98 (BP Location: Right Arm)   Pulse 86   Temp 98.5 F (36.9 C) (Oral)   Resp 18   SpO2 99%  Gen:   Awake, no distress   Resp:  Normal effort  MSK:   Moves extremities without difficulty  Other:  Right lower quadrant tenderness  Medical Decision Making  Medically screening exam initiated at 1:24 PM.  Appropriate orders placed.  Benjamin Fox was informed that the remainder of the evaluation will be completed by another provider, this initial triage assessment does not replace that evaluation, and the importance of remaining in the ED until their evaluation is complete.  Lab work and COVID screen ordered.  Antibiotics ordered. Rooming asap   Benjamin Fox, Benjamin Fox 09/10/20 1328    Benjamin Boss, MD 09/10/20 1348

## 2020-09-10 NOTE — ED Notes (Signed)
Pt AxO x4. Introduced myself to patient. Pt pain 5/10. GCS 15. Pt said he'll call if he needs pain medication. Denies further needs.

## 2020-09-10 NOTE — Progress Notes (Signed)
Request to IR for possible intra-abdominal abscess drain placement.  Patient underwent CTA on 09/08/20 as part of work up for possible recanalization of SMA and celiac stents by Dr. Serafina Royals in IR. CTA incidentally noted a periappendiceal 3.5 cm phlegmonous process with surrounding inflammatory changes suggesting smoldering appendicitis without definite abscess. He was referred to general surgery for further evaluation and seen by their service today. He reported to Dr. Thermon Leyland today that he presented to [facility] health yesterday following a fall. Review of the CT abd/pelvis performed at that facility noted 4 x 2.5 x 2.5 cm heterogenous fluid collection consistent with perforated appendicitis. He was directed to the ED for further evaluation.  CT cervical/thoracic/lumbar spine w/o IV contrast as well as CT chest/abd/pelvis w/IV contrast uploaded today and reviewed by interventional radiologist, Dr. Vernard Gambles, who notes the collection to be mostly phlegmon. He recommends continuing IV antibiotics and holding on percutaneous drain placement at this time. Should the patient clinically worsen percutaneous drain placement can be revisited.  Above was discussed with CCS by Dr. Vernard Gambles. No procedure planned in IR at this time, please place a new consult order if this patient would benefit from further review.  Candiss Norse, PA-C

## 2020-09-10 NOTE — H&P (Signed)
History and Physical    Benjamin Fox [id number] DOB: 05/28/1955 DOA: 09/10/2020  PCP: Martinique, Julie M, NP Consultants:  Dr. Serafina Royals: IR, cardiologist: at [facility], Dr. Metta Clines.  Patient coming from:  Home - lives with wife   Chief Complaint: left sided abdominal pain   HPI: Benjamin Fox is a 65 y.o. 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 He had a pre-op CT on Monday which prompted the appointment with the general surgeon today.  He then fell yesterday afternoon. He was walking on an uneven sidewalk and tripped. He had another CT at [facility] yesterday. He denies any fever, chills, N/V/D.  He has had some abdominal pain that started last Thursday. Pain started on left lower side and then over the weekend migrated over to the right lower quadrant. Pain has been constant at a 2/10, but intensity varies. When it gets bad, pain is rated as a 5/10 and is sharp in nature. No radiation. Movement seems to make it worse.   He was scheduled for a stent replacement for his mesenteric ischemia on Monday.     ED Course: vitals: afebrile, bp: 136/98, HR: 86, RR: 18 and oxygen: 99% on room air.  Pertinent labs: no elevated WBC, hgb: 8.8, potassium: 3.2, CXR with cardiomegaly. Ctangio pelvis from yesterday: periappendiceal 3.5cm phlegmonous process with surrounding inflammatory changes. No abscess. Given zosyn and made NPO. General surgery and IR consulted. WE were asked to admit.   Review of Systems: As per HPI; otherwise review of systems reviewed and negative.   Ambulatory Status:  Ambulates without assistance   History reviewed. No pertinent past medical history.  Past Surgical History:  Procedure Laterality Date   IR RADIOLOGIST EVAL & MGMT  07/25/2020   IR RADIOLOGIST EVAL & MGMT  08/28/2020     Social History   Socioeconomic History   Marital status: Married    Spouse name: Not on file   Number of children: Not on file   Years of education: Not on file   Highest education level: Not on file  Occupational History   Not on file  Tobacco Use   Smoking status: Not on file   Smokeless tobacco: Not on file  Substance and Sexual Activity   Alcohol use: Not on file   Drug use: Not on file   Sexual activity: Not on file  Other Topics Concern   Not on file  Social History Narrative   Not on file   Social Determinants of Health   Financial Resource Strain: Not on file  Food Insecurity: Not on file  Transportation Needs: Not on file  Physical Activity: Not on file  Stress: Not on file  Social Connections: Not on file  Intimate Partner Violence: Not on file    No Known Allergies  Family history  Mother: cancer Father: heart issues, unsure what he had  Prior to Admission medications   Medication Sig Start Date End Date Taking? Authorizing Provider  aspirin EC 81 MG tablet Take 81 mg by mouth daily. Swallow whole.    [provider]  atorvastatin (LIPITOR) 20 MG tablet Take 5 mg by mouth daily. 06/24/20   [provider]  carvedilol (COREG) 12.5 MG tablet Take 12.5 mg by mouth 2 (two) times daily. 06/09/20   [provider]  Cyanocobalamin 5000 MCG SUBL Place 5,000  mcg under the tongue daily. 10/28/15   [provider]  diltiazem (CARDIZEM CD) 240 MG 24 hr capsule Take 240 mg by mouth daily. 07/20/20   [provider]  furosemide (LASIX) 20 MG tablet Take 10 mg by mouth daily. 08/18/15   [provider]  lisinopril (ZESTRIL) 30 MG tablet Take 30 mg by mouth 2 (two) times daily. 07/16/20   [provider]  metFORMIN (GLUCOPHAGE) 1000 MG tablet Take 250 mg by mouth 2 (two) times daily. 06/09/20   [provider]  Multiple Vitamins-Minerals (MULTIVITAMIN WITH MINERALS) tablet Take 1 tablet by mouth daily.     [provider]  pantoprazole (PROTONIX) 40 MG tablet Take 40 mg by mouth 2 (two) times daily. 04/12/20   [provider]  sertraline (ZOLOFT) 100 MG tablet Take 100 mg by mouth daily. 08/05/20   [provider]    Physical Exam: Vitals:   09/10/20 1306 09/10/20 1500 09/10/20 1550  BP: (!) 136/98 (!) 166/94   Pulse: 86 96   Resp: 18 20   Temp: 98.5 F (36.9 C)    TempSrc: Oral    SpO2: 99% 99%   Weight:   (!) 152 kg  Height:   6' (1.829 m)     General:  Appears calm and comfortable and is in NAD Eyes:  PERRL, EOMI, normal lids, iris ENT:  grossly normal hearing, lips & tongue, mmm; appropriate dentition Neck:  no LAD, masses or thyromegaly; no carotid bruits Cardiovascular:  irregularly irregular, no m/r/g. No LE edema.  Respiratory:   CTA bilaterally with no wheezes/rales/rhonchi.  Normal respiratory effort. Abdomen:  soft, TTP in RLQ. +rvosing sign, +psoas sign. +heel tap. No rebound or guarding   Back:   normal alignment, no CVAT Skin:  no rash or induration seen on limited exam. Large scar on anterior left lower leg  Musculoskeletal:  grossly normal tone BUE/BLE, good ROM, no bony abnormality Lower extremity: Limited foot exam with no ulcerations.  2+ distal pulses. Pectus caveteum. TTP over right lateral rib cage  Psychiatric:  grossly normal mood and affect, speech fluent and appropriate, AOx3 Neurologic:  CN 2-12 grossly intact, moves all extremities in coordinated fashion, sensation intact    Radiological Exams on Admission: Independently reviewed - see discussion in A/P where applicable  DG Chest Port 1 View  Result Date: 09/10/2020 CLINICAL DATA:  Preop EXAM: PORTABLE CHEST 1 VIEW COMPARISON:  None. FINDINGS: The heart is enlarged. The mediastinal contours are within normal limits for there is calcified atherosclerotic plaque of the aortic arch. There is no focal consolidation or pulmonary edema. There is no pleural effusion or  pneumothorax. The bones are unremarkable. IMPRESSION: Cardiomegaly. Otherwise, no radiographic evidence of acute cardiopulmonary process. Electronically Signed   By: Valetta Mole M.D.   On: 09/10/2020 14:46    EKG: Independently reviewed.  Atrial fibrillation with rate 85; nonspecific ST changes with no evidence of acute ischemia. Prolonged QT.    Labs on Admission: I have personally reviewed the available labs and imaging studies at the time of the admission.  Pertinent labs:  NO elevated WBC,  hgb: 8.8,  potassium: 3.2,   Assessment/Plan Principal Problem:   Acute appendicitis with localized peritonitis -General surgery following -IR also consulted and reviewed films who noted the collection to be mostly phlegmon. -Recommended continuing IV antibiotics and holding on percutaneous drain placement at this time, but to consult them again if clinically warranted -We will continue IV Zosyn at this time.  -  Continue to follow general surgery recommendation -pain control tylenol, oxycodone and morphine for severe pain.   Active Problems: Hypokalemia -check mag -replete x 1 with 73mq -follow in AM    Atrial fibrillation ([place] Rate controlled. Continue cardizem and coreg  Telemetry Continue home medications Anticoagulation has been held due to aneurysm above pancreas, however, this has resolved. Interested in starting [place] can post procedures  Prolonged QT -replacing potassium, checking magnesium -on telemetry -avoid QT prolonging drugs    CAD (coronary artery disease) Stable. Heart cath in 2008 s/p stent  Continue lipitor    Mesenteric ischemia due to arterial insufficiency ([place] Followed by Dr. SSerafina Royalswith plans for surgery on Monday for a stent replacement -May need to postpone this depending on his hospital stay and course    Hypertension Continue home medication cardizem '240mg'$  and coreg: 12.'5mg'$  BID, also on lisinopril but will need to verify dosage.      Anemia of chronic disease Hgb of 8.8 Has ranged from 7.3-9.0.  Continue to monitor     Anxiety Stable, continue zoloft     Hyperlipidemia Last lipid panel 6/22 with LDL of 33 Continue home statin   Prediabetes A1c of 5.8 in 06/2020 Hold metformin while in hospital   Body mass index is 45.45 kg/m.    Level of care: Telemetry Medical DVT prophylaxis:  SCDs with possible surgery  Code Status:  Full - confirmed with patient Family Communication: wife at bedside: KNoberto Retortand daughter by phone: Amy Craver:[phone] Plan:  The patient is from: home  Anticipated d/c is to: home  Requires inpatient hospitalization and is at significant risk of worsening, requires constant monitoring, assessment and MDM with specialists.   Patient is currently: acutely ill Consults called: general surgery and IR   Admission status:  observation    AOrma FlamingMD [facility]   How to contact the TSouth Florida Baptist HospitalAttending or Consulting provider 7[place] covering provider during after hours [place] for this patient?  Check the care team in CP[facility] Centerand look for a) attending/consulting TRH provider listed and b) the TNortheast Baptist Hospitalteam listed Log into [email or web address] and use [place]'s universal password to access. If you do not have the password, please contact the hospital operator. Locate the TCoordinated Health Orthopedic Hospitalprovider you are looking for under [facility] and page to a number that you can be directly reached. If you still have difficulty reaching the provider, please page the D[facility](Director on Call) for the Hospitalists listed on amion for assistance.   09/10/2020, 5:17 PM

## 2020-09-10 NOTE — ED Notes (Signed)
Dr. Rosendo Gros at bedside

## 2020-09-10 NOTE — ED Notes (Signed)
Pt given a urinal.

## 2020-09-10 NOTE — ED Notes (Signed)
Pt ambulatory to restroom. Says his pain is worse after getting up. I offered him morphine and he said he doesn't want it now. I told him if he gets comfortable in the bed and is still hurting to let me know. Denies other needs.

## 2020-09-10 NOTE — Progress Notes (Signed)
Progress Note     Subjective: CC: right lower quadrant abdominal pain He states pain began Thursday and has been intermittently severe with acute worsening after falling yesterday. He has been eating and drinking without issues and denies nausea or emesis.  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  Objective: Vital signs in last 24 hours: Temp:  [98.5 F (36.9 C)] 98.5 F (36.9 C) (08/17 1306) Pulse Rate:  [86-96] 96 (08/17 1500) Resp:  [18-20] 20 (08/17 1500) BP: (136-166)/(94-98) 166/94 (08/17 1500) SpO2:  [99 %] 99 % (08/17 1500) Weight:  [152 kg] 152 kg (08/17 1550)    Intake/Output from previous day: No intake/output data recorded. Intake/Output this shift: Total I/O In: 34 [IV Piggyback:50] Out: -   PE: General: pleasant, WD, male who is laying in bed in NAD HEENT: head is normocephalic, atraumatic.  Sclera are noninjected. Pupils equal and round. Ears and nose without any masses or lesions.  Mouth is pink and moist Heart: irregular rhythm. Palpable radial and pedal pulses bilaterally Lungs: Respiratory effort nonlabored on room air Abd: soft, ND, abdominal TTP focally in RLQ. No rebound or guarding. Several well healed surgical abdominal scar MSK: all 4 extremities are symmetrical with no cyanosis, clubbing, or edema. Skin: warm and dry with no masses, lesions, or rashes Psych: A&Ox3 with an appropriate affect.    Lab Results:  Recent Labs    09/10/20 1324  WBC 6.8  HGB 8.8*  HCT 28.8*  PLT 258   BMET Recent Labs    09/08/20 1541 09/10/20 1324  NA  --  137  K  --  3.2*  CL  --  101  CO2  --  26  GLUCOSE  --  87  BUN  --  12  CREATININE 0.70 0.58*  CALCIUM  --  8.8*   PT/INR No results for input(s): LABPROT, INR in the last 72 hours. CMP     Component Value Date/Time   NA 137 09/10/2020 1324   K 3.2 (L) 09/10/2020 1324   CL 101 09/10/2020  1324   CO2 26 09/10/2020 1324   GLUCOSE 87 09/10/2020 1324   BUN 12 09/10/2020 1324   CREATININE 0.58 (L) 09/10/2020 1324   CALCIUM 8.8 (L) 09/10/2020 1324   PROT 6.5 09/10/2020 1324   ALBUMIN 2.9 (L) 09/10/2020 1324   AST 12 (L) 09/10/2020 1324   ALT 10 09/10/2020 1324   ALKPHOS 175 (H) 09/10/2020 1324   BILITOT 0.6 09/10/2020 1324   GFRNONAA >60 09/10/2020 1324   Lipase  No results found for: LIPASE     Studies/Results: DG Chest Port 1 View  Result Date: 09/10/2020 CLINICAL DATA:  Preop EXAM: PORTABLE CHEST 1 VIEW COMPARISON:  None. FINDINGS: The heart is enlarged. The mediastinal contours are within normal limits for there is calcified atherosclerotic plaque of the aortic arch. There is no focal consolidation or pulmonary edema. There is no pleural effusion or pneumothorax. The bones are unremarkable. IMPRESSION: Cardiomegaly. Otherwise, no radiographic evidence of acute cardiopulmonary process. Electronically Signed   By: Valetta Mole M.D.   On: 09/10/2020 14:46    Anti-infectives: Anti-infectives (From admission, onward)    Start     Dose/Rate Route Frequency Ordered Stop   09/10/20 1330  piperacillin-tazobactam (ZOSYN) IVPB 3.375 g        3.375 g 100 mL/hr over 30 Minutes Intravenous  Once 09/10/20 1323 09/10/20 1529  Assessment/Plan  Patient evaluated in our clinic by Dr. Thermon Leyland today for Acute appendicitis with localized peritonitis, without perforation or abscess, unspecified whether gangrene present - admit for IV abx - discussed with IR who do not think a drain would be beneficial at this time as fluid collection consistent with phlegmon - will continue to monitor and consider repeat imaging, possible drain, possible surgical management should he fail to improve clinically - He had been planned for surgery with Dr. Serafina Royals on Monday and this will likely need to be postponed   FEN: clear liquids ID: continue zosyn VTE: okay for chemical prophylaxis  from our standpoint   Chronic mesenteric ischemia (CMS-HCC) Pancreatic pseudocyst HLD HTN Chronic atrial fibrillation - not on anticoagulation currently Depression/anxiety DM   LOS: 0 days    Winferd Humphrey, [facility] 09/10/2020, 4:36 PM Please see Amion for pager number during day hours 7:00am-4:30pm

## 2020-09-10 NOTE — ED Notes (Signed)
Dr. Wolfe at bedside.

## 2020-09-10 NOTE — Consult Note (Signed)
REFERRING PHYSICIAN:  Verner Mould, MD  PROVIDER:  Vonita Moss Amylah Will, MD  MRN: [id number] DOB: [birth year] DATE OF ENCOUNTER: 09/10/2020  Subjective   Chief Complaint: Abdominal pain     History of Present Illness: Benjamin Fox is a 65 y.o. male who is seen today as an office consultation at the request of Benjamin Fox for evaluation of abdominal pain.    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  He has had stents placed in his SMA and celiac which have occluded.  Benjamin Fox was evaluating for recannulization of these stents with a preoperative CT scan which demonstrated appendicitis so he was sent to the surgery office for evaluation.  The patient states he has been having some diffuse abdominal pain which started last Thursday.  It originally was vague and around the umbilicus and then started to focus on the right side.  He has been eating and drinking fine with normal appetite and having normal bowel function.  He has been gaining weight and having less postprandial abdominal pain recently which was encouraging as far as the mesenteric stenosis goes.  This preoperative CT scan happened to be scheduled for 09/08/20, and demonstrated findings consistent with appendicitis.    To complicate things, he also had a fall yesterday.  He was seen at the [facility] emergency department and underwent CT chest abdomen pelvis, thoracic and lumbar spine, head, and C-spine as well as x-rays of the right knee right wrist left knee and chest.  These radiology reports are available and reviewed in care everywhere.  Notably the CT of the abdomen pelvis demonstrated a 4 x 2.5 x 2.5 cm heterogenous fluid collection consistent with perforated appendicitis.     Review of Systems: A complete review of systems was obtained from the patient.  I have reviewed this information and discussed as appropriate with the patient.  See HPI as well for other ROS.  ROS     Medical History: Past Medical History:  Diagnosis Date   Aneurysm (CMS-HCC)    Anxiety    Arrhythmia    DVT (deep venous thrombosis) (CMS-HCC)    Hyperlipidemia    Hypertension     There is no problem list on file for this patient.   History reviewed. No pertinent surgical history.   Not on File  Current Outpatient Medications on File Prior to Visit  Medication Sig Dispense Refill   atorvastatin (LIPITOR) 20 MG tablet Take 1 tablet by mouth once daily     carvediloL (COREG) 12.5 MG tablet Take by mouth     diltiazem (TIAZAC) 240 MG ER capsule Take 1 capsule by mouth once daily     fenofibrate nanocrystallized (TRICOR) 145 MG tablet Take 1 tablet by mouth once daily     FUROsemide (LASIX) 20 MG tablet Take by mouth     HYDROcodone-acetaminophen (NORCO) 5-325 mg tablet Take by mouth every 6 (six) hours as needed     lisinopriL (ZESTRIL) 30 MG tablet Take 1.5 tablets by mouth once daily     metFORMIN (GLUCOPHAGE) 1000 MG tablet Take 1 tablet by mouth 2 (two) times daily     ondansetron (ZOFRAN-ODT) 4 MG disintegrating tablet Take by mouth     pancrelipase (CREON) 36,000-114,000-180,000 unit DR capsule Take 2 with each meal and then 1 with a snack     pantoprazole (PROTONIX) 40 MG DR tablet Take by mouth     sertraline (ZOLOFT) 100 MG tablet Take 1 tablet by mouth once  daily     co-enzyme Q-10, ubiquinone, 100 mg capsule Take 100 mg by mouth once daily     famotidine (PEPCID) 10 MG tablet Take by mouth at bedtime as needed     No current facility-administered medications on file prior to visit.    Family History  Problem Relation Age of Onset   Breast cancer Mother    High blood pressure (Hypertension) Father      Social History   Tobacco Use  Smoking Status Never Smoker  Smokeless Tobacco Never Used     Social History   Socioeconomic History   Marital status: Married  Tobacco Use   Smoking status: Never Smoker   Smokeless tobacco: Never Used  Brewing technologist Use: Never used  Substance and Sexual Activity   Alcohol use: Never   Drug use: Never    Objective:    There were no vitals filed for this visit.  There is no height or weight on file to calculate BMI.  Physical Exam   General appearance - Consistent with stated age. Normal posture. Voice Normal. Mental status - alert and oriented Integumentary - No rash or lesion on limited exam Head - Normocephalic, atraumatic Face - Strength and tone intact Eyes - extraocular movement intact, sclera anicteric Chest - quiet, even and easy respiratory effort with no use of accessory muscles Neurological - able to articulate well with normal speech/language, rate, volume and coherence. Mood/affect - normal Judgement and insight -  insight is appropriate concerning matters relevant to self and the patient displays appropriate judgment regarding every day activities. Thought Processes/Cognitive Function - aware of current events. Musculoskeletal - strength symmetrical throughout, no deformity Abdomen -soft, severe tenderness with guarding of the right abdomen up onto the right rib cage.   Labs, Imaging and Diagnostic Testing: CT Scans 09/09/20 at [facility] (radiology report reviewed, not able to view images) CT CHEST  1.  Negative for acute injury identified.  2.  No acute pulmonary infiltrate or pleural effusion identified.   CT ABDOMEN PELVIS  1.  New complex 4 x 2.5 x 2.5 cm heterogeneous fluid collection with surrounding soft tissue induration and tiny pockets of gas adjacent to the tip of the appendix.  This is concerning for a ruptured appendix with abscess formation.  This was not thought to represent a mesenteric hematoma.  2.  No definite evidence for acute injury.   CT THORACIC LUMBAR SPINE  No acute fracture, traumatic malalignment, or paraspinal hematoma identified.   CT HEAD  1.  No acute intracranial abnormality identified.  2.  No calvarial fracture identified.   CT  CERVICAL SPINE  1.  No acute fracture or acute traumatic malalignment identified.  2.  No paraspinal hematoma identified.   CT angio abdomen/pelvis 09/08/20 1. Periappendiceal 3.5 cm phlegmonous process with surrounding inflammatory change, new since 07/29/2020, suggesting smoldering appendicitis. No abscess. 2. Occluded celiac and superior mesenteric artery ostial stents, with collateral arterial supply from patent inferior mesenteric artery. 3. Coronary and Aortic Atherosclerosis (ICD10-170.0).   Assessment and Plan:  Diagnoses and all orders for this visit:  Acute appendicitis with localized peritonitis, without perforation or abscess, unspecified whether gangrene present  Chronic mesenteric ischemia (CMS-HCC)  Pancreatic pseudocyst    Mr. Lasota has acute perforated appendicitis with an abscess according to CT scan.  Unfortunately, I believe his symptoms have been confused with his chronic mesenteric ischemia and pancreatic pseudocyst symptoms.  His symptoms also were confused with a fall at work  yesterday.  I recommend he present to the [facility] emergency department where he can be evaluated and treated in the hospital.  I discussed the case with my partner Dr. Rosendo Gros who is running the emergency general surgery service today.  I would recommend starting antibiotics, and discussing percutaneous drainage with interventional radiology.  We Fox need to obtain the CT from [facility] for the radiologist to review the abscess seen on that CT which was not seen on the CT in the [facility] system a few days prior.  Due to his medical comorbidities, he should be admitted to the medical service.  The surgery team Fox continue to follow the patient closely as he is admitted.  Gregg Holster JEFFREY Evangelos Paulino, MD

## 2020-09-11 ENCOUNTER — Other Ambulatory Visit: Payer: Self-pay | Admitting: Radiology

## 2020-09-11 ENCOUNTER — Observation Stay (HOSPITAL_COMMUNITY): Payer: BC Managed Care – PPO

## 2020-09-11 DIAGNOSIS — K353 Acute appendicitis with localized peritonitis, without perforation or gangrene: Secondary | ICD-10-CM | POA: Diagnosis present

## 2020-09-11 DIAGNOSIS — K863 Pseudocyst of pancreas: Secondary | ICD-10-CM | POA: Diagnosis present

## 2020-09-11 DIAGNOSIS — Z6841 Body Mass Index (BMI) 40.0 and over, adult: Secondary | ICD-10-CM | POA: Diagnosis not present

## 2020-09-11 DIAGNOSIS — K3533 Acute appendicitis with perforation and localized peritonitis, with abscess: Secondary | ICD-10-CM | POA: Diagnosis present

## 2020-09-11 DIAGNOSIS — Z79899 Other long term (current) drug therapy: Secondary | ICD-10-CM | POA: Diagnosis not present

## 2020-09-11 DIAGNOSIS — K551 Chronic vascular disorders of intestine: Secondary | ICD-10-CM | POA: Diagnosis present

## 2020-09-11 DIAGNOSIS — I771 Stricture of artery: Secondary | ICD-10-CM | POA: Diagnosis present

## 2020-09-11 DIAGNOSIS — E785 Hyperlipidemia, unspecified: Secondary | ICD-10-CM | POA: Diagnosis present

## 2020-09-11 DIAGNOSIS — I2510 Atherosclerotic heart disease of native coronary artery without angina pectoris: Secondary | ICD-10-CM | POA: Diagnosis present

## 2020-09-11 DIAGNOSIS — Z955 Presence of coronary angioplasty implant and graft: Secondary | ICD-10-CM | POA: Diagnosis not present

## 2020-09-11 DIAGNOSIS — K3532 Acute appendicitis with perforation and localized peritonitis, without abscess: Secondary | ICD-10-CM | POA: Diagnosis not present

## 2020-09-11 DIAGNOSIS — Z20822 Contact with and (suspected) exposure to covid-19: Secondary | ICD-10-CM | POA: Diagnosis present

## 2020-09-11 DIAGNOSIS — F419 Anxiety disorder, unspecified: Secondary | ICD-10-CM | POA: Diagnosis present

## 2020-09-11 DIAGNOSIS — I119 Hypertensive heart disease without heart failure: Secondary | ICD-10-CM | POA: Diagnosis present

## 2020-09-11 DIAGNOSIS — E119 Type 2 diabetes mellitus without complications: Secondary | ICD-10-CM | POA: Diagnosis present

## 2020-09-11 DIAGNOSIS — G4733 Obstructive sleep apnea (adult) (pediatric): Secondary | ICD-10-CM | POA: Diagnosis present

## 2020-09-11 DIAGNOSIS — E876 Hypokalemia: Secondary | ICD-10-CM | POA: Diagnosis present

## 2020-09-11 DIAGNOSIS — K3580 Unspecified acute appendicitis: Secondary | ICD-10-CM | POA: Diagnosis present

## 2020-09-11 DIAGNOSIS — I482 Chronic atrial fibrillation, unspecified: Secondary | ICD-10-CM | POA: Diagnosis present

## 2020-09-11 DIAGNOSIS — Z7984 Long term (current) use of oral hypoglycemic drugs: Secondary | ICD-10-CM | POA: Diagnosis not present

## 2020-09-11 DIAGNOSIS — D638 Anemia in other chronic diseases classified elsewhere: Secondary | ICD-10-CM | POA: Diagnosis present

## 2020-09-11 DIAGNOSIS — Z7982 Long term (current) use of aspirin: Secondary | ICD-10-CM | POA: Diagnosis not present

## 2020-09-11 LAB — BASIC METABOLIC PANEL
Anion gap: 10 (ref 5–15)
BUN: 9 mg/dL (ref 8–23)
CO2: 26 mmol/L (ref 22–32)
Calcium: 9 mg/dL (ref 8.9–10.3)
Chloride: 102 mmol/L (ref 98–111)
Creatinine, Ser: 0.6 mg/dL — ABNORMAL LOW (ref 0.61–1.24)
GFR, Estimated: >60 mL/min (ref >60)
Glucose, Bld: 81 mg/dL (ref 70–99)
Potassium: 3.5 mmol/L (ref 3.5–5.1)
Sodium: 138 mmol/L (ref 135–145)

## 2020-09-11 LAB — CBC
HCT: 27.1 % — ABNORMAL LOW (ref 39.0–52.0)
Hemoglobin: 8.4 g/dL — ABNORMAL LOW (ref 13.0–17.0)
MCH: 28.5 pg (ref 26.0–34.0)
MCHC: 31 g/dL (ref 30.0–36.0)
MCV: 91.9 fL (ref 80.0–100.0)
Platelets: 231 10*3/uL (ref 150–400)
RBC: 2.95 MIL/uL — ABNORMAL LOW (ref 4.22–5.81)
RDW: 15.4 % (ref 11.5–15.5)
WBC: 6.3 10*3/uL (ref 4.0–10.5)
nRBC: 0 % (ref 0.0–0.2)

## 2020-09-11 LAB — HIV ANTIBODY (ROUTINE TESTING W REFLEX): HIV Screen 4th Generation wRfx: NONREACTIVE

## 2020-09-11 MED ORDER — PIPERACILLIN-TAZOBACTAM 3.375 G IVPB
3.3750 g | Freq: Three times a day (TID) | INTRAVENOUS | Status: DC
  Administered 2020-09-11 – 2020-09-12 (×4): 3.375 g via INTRAVENOUS
  Filled 2020-09-11 (×4): qty 50

## 2020-09-11 NOTE — Progress Notes (Signed)
Pharmacy Antibiotic Note  Benjamin Fox is a 65 y.o. male admitted on 09/10/2020 with intra-abdominal infection.  Pharmacy has been consulted for Zosyn dosing. SCr stable WNL.   Plan: Zosyn 3.375g IV q8h (4hr infusion) Monitor clinical progress, c/s, renal function F/u de-escalation plan   Height: 6' (182.9 cm) Weight: (!) 152 kg (335 lb 1.6 oz) IBW/kg (Calculated) : 77.6  Temp (24hrs), Avg:98.1 F (36.7 C), Min:97.9 F (36.6 C), Max:98.5 F (36.9 C)  Recent Labs  Lab 09/08/20 1541 09/10/20 1324 09/11/20 0336  WBC  --  6.8 6.3  CREATININE 0.70 0.58* 0.60*    Estimated Creatinine Clearance: 136.1 mL/min (A) (by C-G formula based on SCr of 0.6 mg/dL (L)).    No Known Allergies   Arturo Morton, PharmD, BCPS Please check AMION for all [place] contact numbers Clinical Pharmacist 09/11/2020 7:43 AM

## 2020-09-11 NOTE — Plan of Care (Signed)

## 2020-09-11 NOTE — Progress Notes (Signed)
Patient planned for SMA stent recanalization in IR with Dr. Serafina Royals 09/15/20 at 8:30 am - patient admitted 8/17 for perforated appendicitis. He is currently receiving IV antibiotics with improved pain control per CCS.  Discussed patient with Dr. Serafina Royals today, he plans to proceed with SMA stent recanalization on Monday 8/22 as planned. If he is to be discharged before that time he should return to IR on Monday at the assigned time for pre-procedure workup/consult/consent. If he remains admitted on Monday we will plan to bring patient down at 0800 for procedure.   Above was discussed with CCS who is in agreement.  Candiss Norse, PA-C

## 2020-09-11 NOTE — Progress Notes (Signed)
PROGRESS NOTE    Benjamin Fox  [id number] DOB: Mar 29, 1955 DOA: 09/10/2020 PCP: [place], Julie M, NP   Chief Complaint  Patient presents with   Abdominal Pain    Brief Narrative: 65 year old male with history of A. 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  Patient had preop CT scan done in preparation for his mesenteric stent on Monday that showed 3.5 centimeters phlegmonous process surrounding the inflammatory changes no abscess and sent to the ED.  He had abdominal pain last Thursday on left lower side and then over the weekend migrated to the right lower quadrant. Patient was seen by surgery, IR and admitted  Subjective: C/O LLQ pain now moving to RLQ, no nausea.or vomiting On Hissop during night -uses cpap at home   No new complaints  Assessment & Plan:  Acute appendicitis with localized peritonitis, w/o abscess or gangrene likely perforated with phlegmon: Seen by IR, general surgery advised to continue IV antibiotics, now immediate indication for drain is, once improves likely will need interval appendicectomy.  Pain is more localized in the right lower quadrant no fever no leukocytosis.  Continue to monitor clinically, serial CBC.  I will resume Zosyn.  On clear liquid diet, continue plan of care as per surgery who are on board.    Hypokalemia/hypomagnesemia: Repleted.  Repeat labs  Chronic A. Fib: rate controlled, cont on Coreg 12.5 mg, Cardizem 240 mg, patient reports he had lto going on with pancreatic pesudoanyerusym and cyst - and his GI and surgeon at [facility]  felt too high risk for xarelto- and has been held since.Patient was seen by Dr  Delrae Alfred and Dr Carlis Abbott at [facility] and IR Dr Serafina Royals. Hopefully can resume anticoagulation post procedure.    Prolonged QTC on EKG avoid QT prolonging medication.  Monitor in telemetry monitor electrolytes Recent pancreatic pseudoaneurysm CT abd now improved.  HLD cont Lipitor  HTN stable on coreg, Cardizem  lisinopril  Anxiety on Zoloft  CAD stent in 2008: No chest pain.  Continue aspirin statin beta-blocker  Chronic mesenteric ischemia followed by Dr. Serafina Royals with plans for surgery on Monday for stent replacement may need to postpone, he will follow-up with his surgery.  CT Angion OP 8/15: occluded celiac and superior mesenteric artery ostial stents,with collateral arterial supply from patent inferior mesenteric artery.  Type 2 diabetes, hold his metformin continue sliding scale insulin. No results found for: HGBA1C  No results for input(s): GLUCAP in the last 168 hours.   Anemia of chronic disease Baseline 7.3-9. Monitor Recent Labs  Lab 09/10/20 1324 09/11/20 0336  HGB 8.8* 8.4*  HCT 28.8* 27.1*    Morbid obesity with BMI 45: Benefit with weight loss, PCP follow-up  OSA resume CPAP at bedtime  Diet Order             Diet clear liquid Room service appropriate? Yes; Fluid consistency: Thin  Diet effective now                   Patient's Body mass index is 45.45 kg/m. DVT prophylaxis: SCDs Start: 09/10/20 1720 Code Status:   Code Status: Full Code  Family Communication: plan of care discussed with patient at bedside.  Status is: admitted as Observation Remains hospitalized for ongoing IV antibiotics for management of acute appendicitis will  need at least 2 midnight stay Dispo: The patient is from: Home              Anticipated d/c is to: Home              Patient currently is not medically stable to d/c.   Difficult to place patient No Unresulted Labs (From admission, onward)    None       Medications reviewed:  Scheduled Meds:  aspirin EC  81 mg Oral Daily   atorvastatin  5 mg Oral Daily   carvedilol  12.5 mg Oral BID   diltiazem  240 mg Oral Daily    multivitamin with minerals  1 tablet Oral Daily   pantoprazole  40 mg Oral BID   sertraline  100 mg Oral Daily   sodium chloride flush  3 mL Intravenous Q12H   Continuous Infusions:  sodium chloride     Consultants:see note  Procedures:see note Antimicrobials: Anti-infectives (From admission, onward)    Start     Dose/Rate Route Frequency Ordered Stop   09/10/20 1330  piperacillin-tazobactam (ZOSYN) IVPB 3.375 g        3.375 g 100 mL/hr over 30 Minutes Intravenous  Once 09/10/20 1323 09/10/20 1529      Culture/Microbiology No results found for: SDES, SPECREQUEST, CULT, REPTSTATUS  Other culture-see note  Objective: Vitals: Today's Vitals   09/11/20 0345 09/11/20 0440 09/11/20 0448 09/11/20 0518  BP:  (!) 144/98    Pulse: 69 85    Resp: 19 18    Temp:  97.9 F (36.6 C)    TempSrc:  Oral    SpO2: 98% 91%    Weight:      Height:      PainSc:   8  Asleep    Intake/Output Summary (Last 24 hours) at 09/11/2020 0718 Last data filed at 09/10/2020 1529 Gross per 24 hour  Intake 50 ml  Output --  Net 50 ml   Filed Weights   09/10/20 1550  Weight: (!) 152 kg   Weight change:   Intake/Output from previous day: 08/17 0701 - 08/18 0700 In: 50 [IV Piggyback:50] Out: -  Intake/Output this shift: No intake/output data recorded. Filed Weights   09/10/20 1550  Weight: (!) 152 kg   Examination: General exam: AAO x3, obese, pleasant HEENT:Oral mucosa moist, Ear/Nose WNL grossly,dentition normal. Respiratory system: bilaterally diminished, no use of accessory muscle, non tender. Cardiovascular system: S1 & S2 +,No JVD. Gastrointestinal system: Abdomen soft, generalized tenderness present but more localized in the right lower quadrant ,ND, BS+. Nervous System:Alert, awake, moving extremities Extremities: No edema, distal peripheral pulses palpable.  Skin: No rashes,no icterus. MSK: Normal muscle bulk,tone, power Data Reviewed: I have personally reviewed following labs  and imaging studies CBC: Recent Labs  Lab 09/10/20 1324 09/11/20 0336  WBC 6.8 6.3  NEUTROABS 4.5  --   HGB 8.8* 8.4*  HCT 28.8* 27.1*  MCV 92.6 91.9  PLT 258 [id number]   Basic Metabolic Panel: Recent Labs  Lab 09/08/20 1541 09/10/20 1324 09/10/20 2219 09/11/20 0336  NA  --  137  --  138  K  --  3.2*  --  3.5  CL  --  101  --  102  CO2  --  26  --  26  GLUCOSE  --  87  --  81  BUN  --  12  --  9  CREATININE 0.70 0.58*  --  0.60*  CALCIUM  --  8.8*  --  9.0  MG  --   --  1.6*  --    GFR: Estimated Creatinine Clearance: 136.1 mL/min (A) (by C-G formula based on SCr of 0.6 mg/dL (L)). Liver Function Tests: Recent Labs  Lab 09/10/20 1324  AST 12*  ALT 10  ALKPHOS 175*  BILITOT 0.6  PROT 6.5  ALBUMIN 2.9*   No results for input(s): LIPASE, AMYLASE in the last 168 hours. No results for input(s): AMMONIA in the last 168 hours. Coagulation Profile: No results for input(s): INR, PROTIME in the last 168 hours. Cardiac Enzymes: No results for input(s): CKTOTAL, CKMB, CKMBINDEX, TROPONINI in the last 168 hours. BNP (last 3 results) No results for input(s): PROBNP in the last 8760 hours. HbA1C: No results for input(s): HGBA1C in the last 72 hours. CBG: No results for input(s): GLUCAP in the last 168 hours. Lipid Profile: No results for input(s): CHOL, HDL, LDLCALC, TRIG, CHOLHDL, LDLDIRECT in the last 72 hours. Thyroid Function Tests: No results for input(s): TSH, T4TOTAL, FREET4, T3FREE, THYROIDAB in the last 72 hours. Anemia Panel: No results for input(s): VITAMINB12, FOLATE, FERRITIN, TIBC, IRON, RETICCTPCT in the last 72 hours. Sepsis Labs: No results for input(s): PROCALCITON, LATICACIDVEN in the last 168 hours.  Recent Results (from the past 240 hour(s))  Resp Panel by RT-PCR (Flu A&B, Covid) Nasopharyngeal Swab     Status: None   Collection Time: 09/10/20  2:45 PM   Specimen: Nasopharyngeal Swab; Nasopharyngeal(NP) swabs in vial transport medium  Result Value  Ref Range Status   SARS Coronavirus 2 by RT PCR NEGATIVE NEGATIVE Final    Comment: (NOTE) SARS-CoV-2 target nucleic acids are NOT DETECTED.  The SARS-CoV-2 RNA is generally detectable in upper respiratory specimens during the acute phase of infection. The lowest concentration of SARS-CoV-2 viral copies this assay can detect is 138 copies/mL. A negative result does not preclude SARS-Cov-2 infection and should not be used as the sole basis for treatment or other patient management decisions. A negative result may occur with  improper specimen collection/handling, submission of specimen other than nasopharyngeal swab, presence of viral mutation(s) within the areas targeted by this assay, and inadequate number of viral copies(<138 copies/mL). A negative result must be combined with clinical observations, patient history, and epidemiological information. The expected result is Negative.  Fact Sheet for Patients:  EntrepreneurPulse.com.au  Fact Sheet for Healthcare Providers:  IncredibleEmployment.be  This test is no t yet approved or cleared by the [place] FDA and  has been authorized for detection and/or diagnosis of SARS-CoV-2 by FDA under an Emergency Use Authorization (EUA). This EUA will remain  in effect (meaning this test can be used) for the duration of the COVID-19 declaration under Section 564(b)(1) of the Act, 21 U.S.C.section 360bbb-3(b)(1), unless the authorization is terminated  or revoked sooner.       Influenza A by PCR NEGATIVE NEGATIVE Final   Influenza B by PCR NEGATIVE NEGATIVE Final    Comment: (NOTE) The Xpert Xpress SARS-CoV-2/FLU/RSV plus assay is intended as an aid in the diagnosis of influenza from Nasopharyngeal swab specimens and should not be used as a sole basis for treatment. Nasal washings and aspirates are unacceptable for Xpert Xpress SARS-CoV-2/FLU/RSV testing.  Fact Sheet for  Patients: EntrepreneurPulse.com.au  Fact Sheet for Healthcare Providers: IncredibleEmployment.be  This test is not yet approved or cleared by the [place] FDA and has been authorized for detection and/or diagnosis of SARS-CoV-2 by FDA under an Emergency Use Authorization (EUA). This EUA will remain in effect (  meaning this test can be used) for the duration of the COVID-19 declaration under Section 564(b)(1) of the Act, 21 U.S.C. section 360bbb-3(b)(1), unless the authorization is terminated or revoked.  Performed at [facility], [place] [address], [place], [place] [address]      Radiology Studies: DG Chest Port 1 View  Result Date: 09/10/2020 CLINICAL DATA:  Preop EXAM: PORTABLE CHEST 1 VIEW COMPARISON:  None. FINDINGS: The heart is enlarged. The mediastinal contours are within normal limits for there is calcified atherosclerotic plaque of the aortic arch. There is no focal consolidation or pulmonary edema. There is no pleural effusion or pneumothorax. The bones are unremarkable. IMPRESSION: Cardiomegaly. Otherwise, no radiographic evidence of acute cardiopulmonary process. Electronically Signed   By: Valetta Mole M.D.   On: 09/10/2020 14:46     LOS: 0 days   Antonieta Pert, MD [facility]  09/11/2020, 7:18 AM

## 2020-09-11 NOTE — ED Notes (Signed)
Pt appears to be asleep. NAD noted. 

## 2020-09-11 NOTE — Progress Notes (Signed)
Subjective: CC: Reports that last Friday started having LLQ abdominal pain that radiated to the RLQ. Now having 2/10 RLQ abdominal pain that is slightly improved from yesterday. No n/v and tolerating cld. Last BM yesterday.   Objective: Vital signs in last 24 hours: Temp:  [97.7 F (36.5 C)-98.5 F (36.9 C)] 97.7 F (36.5 C) (08/18 0900) Pulse Rate:  [69-110] 85 (08/18 0440) Resp:  [16-21] 18 (08/18 0440) BP: (123-166)/(79-128) 144/98 (08/18 0440) SpO2:  [88 %-100 %] 91 % (08/18 0440) Weight:  [152 kg] 152 kg (08/17 1550)    Intake/Output from previous day: 08/17 0701 - 08/18 0700 In: 70 [IV Piggyback:50] Out: -  Intake/Output this shift: Total I/O In: -  Out: 350 [Urine:350]  PE: Gen:  Alert, NAD, pleasant Pulm: Normal rate and effort  Abd: Soft, mild distension, tenderness of the lower abdomen greatest in the RLQ with deep palpation. No peritonitis. +BS.  Psych: A&Ox3  Skin: no rashes noted, warm and dry  Lab Results:  Recent Labs    09/10/20 1324 09/11/20 0336  WBC 6.8 6.3  HGB 8.8* 8.4*  HCT 28.8* 27.1*  PLT 258 231   BMET Recent Labs    09/10/20 1324 09/11/20 0336  NA 137 138  K 3.2* 3.5  CL 101 102  CO2 26 26  GLUCOSE 87 81  BUN 12 9  CREATININE 0.58* 0.60*  CALCIUM 8.8* 9.0   PT/INR No results for input(s): LABPROT, INR in the last 72 hours. CMP     Component Value Date/Time   NA 138 09/11/2020 0336   K 3.5 09/11/2020 0336   CL 102 09/11/2020 0336   CO2 26 09/11/2020 0336   GLUCOSE 81 09/11/2020 0336   BUN 9 09/11/2020 0336   CREATININE 0.60 (L) 09/11/2020 0336   CALCIUM 9.0 09/11/2020 0336   PROT 6.5 09/10/2020 1324   ALBUMIN 2.9 (L) 09/10/2020 1324   AST 12 (L) 09/10/2020 1324   ALT 10 09/10/2020 1324   ALKPHOS 175 (H) 09/10/2020 1324   BILITOT 0.6 09/10/2020 1324   GFRNONAA >60 09/11/2020 0336   Lipase  No results found for: LIPASE  Studies/Results: DG Chest Port 1 View  Result Date: 09/10/2020 CLINICAL DATA:   Preop EXAM: PORTABLE CHEST 1 VIEW COMPARISON:  None. FINDINGS: The heart is enlarged. The mediastinal contours are within normal limits for there is calcified atherosclerotic plaque of the aortic arch. There is no focal consolidation or pulmonary edema. There is no pleural effusion or pneumothorax. The bones are unremarkable. IMPRESSION: Cardiomegaly. Otherwise, no radiographic evidence of acute cardiopulmonary process. Electronically Signed   By: Valetta Mole M.D.   On: 09/10/2020 14:46    Anti-infectives: Anti-infectives (From admission, onward)    Start     Dose/Rate Route Frequency Ordered Stop   09/11/20 0830  piperacillin-tazobactam (ZOSYN) IVPB 3.375 g        3.375 g 12.5 mL/hr over 240 Minutes Intravenous Every 8 hours 09/11/20 0741     09/10/20 1330  piperacillin-tazobactam (ZOSYN) IVPB 3.375 g        3.375 g 100 mL/hr over 30 Minutes Intravenous  Once 09/10/20 1323 09/10/20 1529        Assessment/Plan Perforated appendicitis w/ phlegmon vs abscess - Our team discussed with IR yesterday who do not think a drain would be beneficial at this time as fluid collection consistent with phlegmon - WBC wnl. Cont abx - Adv to FLD - Will continue to monitor and consider repeat  imaging, possible drain, possible surgical management should he fail to improve clinically - He had been planned for surgery with Dr. Serafina Royals on Monday and this will likely need to be postponed   FEN: FLD ID: continue zosyn VTE: okay for chemical prophylaxis from our standpoint   Chronic mesenteric ischemia (CMS-HCC) - Hx of prior SMA and Celiac stent 5/22. CTA 8/15 showed Occluded celiac and superior mesenteric artery ostial stents, with collateral arterial supply from patent inferior mesenteric artery. Was planned for surgery with Dr. Serafina Royals on Monday. Will reach out to IR to ensure that he is aware patient is in the hospital.   Pancreatic pseudocyst HLD HTN Chronic atrial fibrillation - not on anticoagulation  currently. Okay for heparin gtt if needed Depression/anxiety DM   LOS: 0 days    Jillyn Ledger , [facility] Surgery 09/11/2020, 9:36 AM Please see Amion for pager number during day hours 7:00am-4:30pm

## 2020-09-11 NOTE — Progress Notes (Addendum)
Agree with assessments/charting this shift as documented by orientee, Victorino Dike, RN.

## 2020-09-11 NOTE — ED Notes (Signed)
Pt fell asleep after morphine and oxygen sats started to drop into the 70s. I placed pt on 2L [place] and O2 now 96%. Pt said morphine helped his pain a lot.

## 2020-09-12 ENCOUNTER — Other Ambulatory Visit: Payer: Self-pay | Admitting: Radiology

## 2020-09-12 ENCOUNTER — Other Ambulatory Visit (HOSPITAL_COMMUNITY): Payer: Self-pay | Admitting: *Deleted

## 2020-09-12 DIAGNOSIS — K551 Chronic vascular disorders of intestine: Secondary | ICD-10-CM

## 2020-09-12 DIAGNOSIS — K3532 Acute appendicitis with perforation and localized peritonitis, without abscess: Secondary | ICD-10-CM

## 2020-09-12 MED ORDER — OXYCODONE HCL 5 MG PO TABS: 5.0000 mg | ORAL_TABLET | ORAL | 0 refills | Status: DC | PRN

## 2020-09-12 MED ORDER — MAGNESIUM OXIDE -MG SUPPLEMENT 400 (240 MG) MG PO TABS
400.0000 mg | ORAL_TABLET | Freq: Two times a day (BID) | ORAL | Status: DC
  Administered 2020-09-12: 400 mg via ORAL
  Filled 2020-09-12: qty 1

## 2020-09-12 MED ORDER — AMOXICILLIN-POT CLAVULANATE 875-125 MG PO TABS: 1.0000 | ORAL_TABLET | Freq: Two times a day (BID) | ORAL | 0 refills | Status: AC

## 2020-09-12 MED ORDER — ACETAMINOPHEN 500 MG PO TABS: 1000.0000 mg | ORAL_TABLET | Freq: Four times a day (QID) | ORAL | Status: AC | PRN

## 2020-09-12 NOTE — Discharge Summary (Signed)
Physician Discharge Summary  Benjamin Fox [id number] DOB: Oct 29, 1955 DOA: 09/10/2020  PCP: Martin[place], Julie M, NP  Admit date: 09/10/2020 Discharge date: 09/12/2020  Admitted From: home Disposition:  home  Recommendations for Outpatient Follow-up:  Follow up with PCP in 1-2 weeks IR Dr. Danny Lawless for planned procedure Monday and with surgery in 3 weeks [place] with your cardiology and your primary team regarding Eliquis   Home Health:no  Equipment/Devices: none  Discharge Condition: Stable Code Status:   Code Status: Full Code Diet recommendation:  Diet Order             DIET SOFT Room service appropriate? Yes; Fluid consistency: Thin  Diet effective now                   Brief/Interim Summary: 65 year old male with history of A. 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  Patient had preop CT scan done in preparation for his mesenteric stent on Monday that showed 3.5 centimeters phlegmonous process surrounding the inflammatory changes no abscess and sent to the ED.  He had abdominal pain last Thursday on left lower side and then over the weekend migrated to the right lower quadrant. Patient was seen by surgery, IR and admitted. Patient is being managed conservatively with IV antibiotics Is tolerating diet no abdominal pain, diet advance.  Surgery okay to discharge home with follow-up with IR for stent Monday and with Dr. Rosendo Gros in 3 wks for appendicectomy in the prescribed Augmentin sent to pharmacy.  Can go home if tolerates diet today.  Discharge Diagnoses:  Acute appendicitis with localized peritonitis, w/o abscess or gangrene likely perforated with phlegmon: Seen by IR, general surgery- managed w/ IV Zosyn,IV fluids.conservative management  with pain control and interval appendicectomy.  Appreciate surgery IR input-on full liquid diet, advance as tolerated per surgery.   Hypokalemia/hypomagnesemia: Resolved.  Chronic A. Fib: rate controlled, continues Coreg 12.5 mg, Cardizem 240 mg.  Patient reports his anticoagulation has been held as he was felt to be high risk given recent issues with his pancreas, stents plan , falls. Patient was seen by Dr  Delrae Alfred and Dr Carlis Abbott at [facility] and IR Dr Serafina Royals. Hopefully can resume anticoagulation post procedure.  I have instructed him to touch base with his cardiology team as well as the Mds who has held his eliquis.   Prolonged QTC on EKG avoid QT prolonging medication.  Monitor in telemetry monitor electrolytes Recent pancreatic pseudoaneurysm CT abd now improved. It seems there was plan for EUS/ERCP at [facility] and he has not scheduled it.  HLD cont Lipitor  Essential HTN well-controlled on coreg, Cardizem  lisinopril  Anxiety : Mood is stable on Zoloft  CAD stent in 2008: No chest pain.  Continue aspirin statin and coreg  Chronic mesenteric ischemia followed by Dr. Serafina Royals with plans for stent replacement Monday.CT Angion OP 8/15: occluded celiac and superior mesenteric artery ostial stents,with collateral arterial supply from patent inferior mesenteric artery.  Type 2 diabetes, hold his metformin.  Blood sugar this morning 81.  Monitor  Anemia of chronic disease Baseline 7.3-9.  Appears stable monitor Recent Labs  Lab 09/10/20 1324 09/11/20 0336  HGB 8.8* 8.4*  HCT 28.8* 27.1*    Morbid obesity with BMI 45: he will benefit with weight loss, PCP follow-up  OSA: uses CPAP at bedtime Consults: General surgery, IR  Subjective: Alert awake oriented, resting comfortably.  No complaints.  Discharge Exam: Vitals:   09/12/20 0315 09/12/20 0924  BP: 121/71 123/78  Pulse: 65 76  Resp: 18 18  Temp: 98 F (36.7 C)   SpO2: 94% 92%   General: Pt is alert, awake, not in acute  distress Cardiovascular: RRR, S1/S2 +, no rubs, no gallops Respiratory: CTA bilaterally, no wheezing, no rhonchi Abdominal: Soft, NT, ND, bowel sounds + Extremities: no edema, no cyanosis  Discharge Instructions  Discharge Instructions     Discharge instructions   Complete by: As directed    Please discuss with your MD regarding your Eliquis resumption.  Follow-up with IR for a stent placed on Monday.  Please discuss with your and follow-up with their GI doctor at [facility]  Please call call MD or return to ER for similar or worsening recurring problem that brought you to hospital or if any fever,nausea/vomiting,abdominal pain, uncontrolled pain, chest pain,  shortness of breath or any other alarming symptoms.  Please follow-up your doctor as instructed in a week time and call the office for appointment.  Please avoid alcohol, smoking, or any other illicit substance and maintain healthy habits including taking your regular medications as prescribed.  You were cared for by a hospitalist during your hospital stay. If you have any questions about your discharge medications or the care you received while you were in the hospital after you are discharged, you can call the unit and ask to speak with the hospitalist on call if the hospitalist that took care of you is not available.  Once you are discharged, your primary care physician will handle any further medical issues. Please note that NO REFILLS for any discharge medications will be authorized once you are discharged, as it is imperative that you return to your primary care physician (or establish a relationship with a primary care physician if you do not have one) for your aftercare needs so that they can reassess your need for medications and monitor your lab values   Increase activity slowly   Complete by: As directed       Allergies as of 09/12/2020   No Known Allergies      Medication List     STOP taking these medications     acetaminophen 650 MG CR tablet Commonly known as: TYLENOL Replaced by: acetaminophen 500 MG tablet       TAKE these medications    acetaminophen 500 MG tablet Commonly known as: TYLENOL Take 2 tablets (1,000 mg total) by mouth every 6 (six) hours as needed for mild pain (or Fever >/= 101). Replaces: acetaminophen 650 MG CR tablet   amoxicillin-clavulanate 875-125 MG tablet Commonly known as: Augmentin Take 1 tablet by mouth 2 (two) times daily for 9 days.   aspirin EC 81 MG tablet Take 81 mg by mouth daily. Swallow whole.   atorvastatin 20 MG tablet Commonly known as: LIPITOR Take 10 mg by mouth daily.   carvedilol 12.5 MG tablet Commonly known as: COREG Take 12.5 mg by mouth 2 (two) times daily.   Coenzyme Q10 100 MG capsule Take 100 mg by mouth daily.   Cyanocobalamin 5000 MCG Subl Place 5,000 mcg under the tongue daily.   diltiazem 240 MG 24 hr capsule Commonly known as: CARDIZEM CD Take 240 mg by mouth daily.   furosemide 20 MG tablet Commonly known as: LASIX Take 10 mg by mouth daily.   HYDROcodone-acetaminophen  5-325 MG tablet Commonly known as: NORCO/VICODIN Take 1 tablet by mouth every 6 (six) hours as needed for severe pain.   lisinopril 30 MG tablet Commonly known as: ZESTRIL Take 30 mg by mouth 2 (two) times daily.   metFORMIN 1000 MG tablet Commonly known as: GLUCOPHAGE Take 500 mg by mouth 2 (two) times daily.   multivitamin with minerals tablet Take 1 tablet by mouth daily.   oxyCODONE 5 MG immediate release tablet Commonly known as: Oxy IR/ROXICODONE Take 1 tablet (5 mg total) by mouth every 4 (four) hours as needed for moderate pain. What changed: reasons to take this   pantoprazole 40 MG tablet Commonly known as: PROTONIX Take 40 mg by mouth 2 (two) times daily.   sertraline 100 MG tablet Commonly known as: ZOLOFT Take 100 mg by mouth daily.        Follow-up Information     Ralene Ok, MD Follow up on 10/03/2020.    Specialty: General Surgery Why: 10:00am arrive at 9:30am for paperwork and check in process Contact information: [place] [address] [phone]                No Known Allergies  The results of significant diagnostics from this hospitalization (including imaging, microbiology, ancillary and laboratory) are listed below for reference.    Microbiology: Recent Results (from the past 240 hour(s))  Resp Panel by RT-PCR (Flu A&B, Covid) Nasopharyngeal Swab     Status: None   Collection Time: 09/10/20  2:45 PM   Specimen: Nasopharyngeal Swab; Nasopharyngeal(NP) swabs in vial transport medium  Result Value Ref Range Status   SARS Coronavirus 2 by RT PCR NEGATIVE NEGATIVE Final    Comment: (NOTE) SARS-CoV-2 target nucleic acids are NOT DETECTED.  The SARS-CoV-2 RNA is generally detectable in upper respiratory specimens during the acute phase of infection. The lowest concentration of SARS-CoV-2 viral copies this assay can detect is 138 copies/mL. A negative result does not preclude SARS-Cov-2 infection and should not be used as the sole basis for treatment or other patient management decisions. A negative result may occur with  improper specimen collection/handling, submission of specimen other than nasopharyngeal swab, presence of viral mutation(s) within the areas targeted by this assay, and inadequate number of viral copies(<138 copies/mL). A negative result must be combined with clinical observations, patient history, and epidemiological information. The expected result is Negative.  Fact Sheet for Patients:  EntrepreneurPulse.com.au  Fact Sheet for Healthcare Providers:  IncredibleEmployment.be  This test is no t yet approved or cleared by the [place] FDA and  has been authorized for detection and/or diagnosis of SARS-CoV-2 by FDA under an Emergency Use Authorization (EUA). This EUA will remain  in  effect (meaning this test can be used) for the duration of the COVID-19 declaration under Section 564(b)(1) of the Act, 21 U.S.C.section 360bbb-3(b)(1), unless the authorization is terminated  or revoked sooner.       Influenza A by PCR NEGATIVE NEGATIVE Final   Influenza B by PCR NEGATIVE NEGATIVE Final    Comment: (NOTE) The Xpert Xpress SARS-CoV-2/FLU/RSV plus assay is intended as an aid in the diagnosis of influenza from Nasopharyngeal swab specimens and should not be used as a sole basis for treatment. Nasal washings and aspirates are unacceptable for Xpert Xpress SARS-CoV-2/FLU/RSV testing.  Fact Sheet for Patients: EntrepreneurPulse.com.au  Fact Sheet for Healthcare Providers: IncredibleEmployment.be  This test is not yet approved or cleared by the [place] FDA and has been  authorized for detection and/or diagnosis of SARS-CoV-2 by FDA under an Emergency Use Authorization (EUA). This EUA will remain in effect (meaning this test can be used) for the duration of the COVID-19 declaration under Section 564(b)(1) of the Act, 21 U.S.C. section 360bbb-3(b)(1), unless the authorization is terminated or revoked.  Performed at [facility], [place] [address], [place], [place] [address]     Procedures/Studies: DG Chest Port 1 View  Result Date: 09/10/2020 CLINICAL DATA:  Preop EXAM: PORTABLE CHEST 1 VIEW COMPARISON:  None. FINDINGS: The heart is enlarged. The mediastinal contours are within normal limits for there is calcified atherosclerotic plaque of the aortic arch. There is no focal consolidation or pulmonary edema. There is no pleural effusion or pneumothorax. The bones are unremarkable. IMPRESSION: Cardiomegaly. Otherwise, no radiographic evidence of acute cardiopulmonary process. Electronically Signed   By: Valetta Mole M.D.   On: 09/10/2020 14:46   IR Radiologist Eval & Mgmt  Result Date: 08/28/2020 Please refer to notes tab for  details about interventional procedure. (Op Note)  CT Angio Abd/Pel w/ and/or w/o  Result Date: 09/08/2020 CLINICAL DATA:  Chronic mesenteric ischemia, left-sided abdominal pain EXAM: CTA ABDOMEN AND PELVIS WITH CONTRAST TECHNIQUE: Multidetector CT imaging of the abdomen and pelvis was performed using the standard protocol during bolus administration of intravenous contrast. Multiplanar reconstructed images and MIPs were obtained and reviewed to evaluate the vascular anatomy. CONTRAST:  147m OMNIPAQUE IOHEXOL 300 MG/ML  SOLN COMPARISON:  07/29/2020 FINDINGS: VASCULAR Aorta: Moderate calcified plaque. No aneurysm, dissection, or stenosis. Celiac: Occluded stent across the origin, protrudes approximately 1 cm into the lumen of the aorta. Collateral reconstitution of the trifurcation vessels. SMA: Occluded stent across the origin, protrudes approximately 0.5 cm into the lumen of the aorta. There is angulation of the native vessel just distal to the stent with short-segment occlusion. Collateral reconstitution of the distal SMA which is moderately atheromatous. Renals: Single left, with calcified ostial plaque, no significant stenosis. Duplicated right, superior dominant, both with proximal short-segment stenoses of at least mild hemodynamic significance, patent distally. IMA: Mild short-segment origin stenosis related to aortic wall plaque, dilated distally with prominent arc of [place] collateral supply to SMA distribution. Inflow: Moderate calcified plaque, ectatic. No dissection or stenosis. Proximal Outflow: Ectatic, atheromatous, patent. Veins: Tapered narrowing of the splenic vein to the level of the pancreatic tail. Patent IMV, SMV, and portal vein. Patent bilateral renal veins. Review of the MIP images confirms the above findings. NON-VASCULAR Lower chest: No pleural or pericardial effusion. Coronary calcifications. Calcified granuloma in the posterior basal segment right lower lobe. Hepatobiliary: No focal  liver abnormality is seen. No gallstones, gallbladder wall thickening, or biliary dilatation. Pancreas: Scattered coarse calcifications in the pancreatic body and head. No discrete mass or ductal dilatation. Spleen: Normal in size.  Small calcified granulomas. Adrenals/Urinary Tract: Adrenal glands unremarkable. Stable bilateral renal cysts. No hydronephrosis. Urinary bladder incompletely distended. Stomach/Bowel: Stomach is decompressed. Small bowel is nondistended. 3.5 cm poorly marginated phlegmonous process adjacent to the appendix, with mild regional inflammatory/edematous change, new since previous. No discrete abscess. The colon is nondilated, unremarkable. Lymphatic: No abdominal or pelvic adenopathy. Reproductive: Mild prostate enlargement. Other: Bilateral pelvic phleboliths.  No ascites.  No free air. Musculoskeletal: Mild multilevel lumbar spondylitic change. No fracture or worrisome bone lesion. IMPRESSION: 1. Periappendiceal 3.5 cm phlegmonous process with surrounding inflammatory change, new since 07/29/2020, suggesting smoldering appendicitis. No abscess. 2. Occluded celiac and superior mesenteric artery ostial stents, with collateral arterial supply from patent  inferior mesenteric artery. 3. Coronary and Aortic Atherosclerosis (ICD10-170.0). Electronically Signed   By: Lucrezia Europe M.D.   On: 09/08/2020 16:41    Labs: BNP (last 3 results) No results for input(s): BNP in the last 8760 hours. Basic Metabolic Panel: Recent Labs  Lab 09/08/20 1541 09/10/20 1324 09/10/20 2219 09/11/20 0336  NA  --  137  --  138  K  --  3.2*  --  3.5  CL  --  101  --  102  CO2  --  26  --  26  GLUCOSE  --  87  --  81  BUN  --  12  --  9  CREATININE 0.70 0.58*  --  0.60*  CALCIUM  --  8.8*  --  9.0  MG  --   --  1.6*  --    Liver Function Tests: Recent Labs  Lab 09/10/20 1324  AST 12*  ALT 10  ALKPHOS 175*  BILITOT 0.6  PROT 6.5  ALBUMIN 2.9*   No results for input(s): LIPASE, AMYLASE in the  last 168 hours. No results for input(s): AMMONIA in the last 168 hours. CBC: Recent Labs  Lab 09/10/20 1324 09/11/20 0336  WBC 6.8 6.3  NEUTROABS 4.5  --   HGB 8.8* 8.4*  HCT 28.8* 27.1*  MCV 92.6 91.9  PLT 258 231   Cardiac Enzymes: No results for input(s): CKTOTAL, CKMB, CKMBINDEX, TROPONINI in the last 168 hours. BNP: Invalid input(s): POCBNP CBG: No results for input(s): GLUCAP in the last 168 hours. D-Dimer No results for input(s): DDIMER in the last 72 hours. Hgb A1c No results for input(s): HGBA1C in the last 72 hours. Lipid Profile No results for input(s): CHOL, HDL, LDLCALC, TRIG, CHOLHDL, LDLDIRECT in the last 72 hours. Thyroid function studies No results for input(s): TSH, T4TOTAL, T3FREE, THYROIDAB in the last 72 hours.  Invalid input(s): FREET3 Anemia work up No results for input(s): VITAMINB12, FOLATE, FERRITIN, TIBC, IRON, RETICCTPCT in the last 72 hours. Urinalysis No results found for: COLORURINE, APPEARANCEUR, [place], [place], GLUCOSEU, [place], [place], [place], PROTEINUR, UROBILINOGEN, NITRITE, LEUKOCYTESUR Sepsis Labs Invalid input(s): PROCALCITONIN,  WBC,  LACTICIDVEN Microbiology Recent Results (from the past 240 hour(s))  Resp Panel by RT-PCR (Flu A&B, Covid) Nasopharyngeal Swab     Status: None   Collection Time: 09/10/20  2:45 PM   Specimen: Nasopharyngeal Swab; Nasopharyngeal(NP) swabs in vial transport medium  Result Value Ref Range Status   SARS Coronavirus 2 by RT PCR NEGATIVE NEGATIVE Final    Comment: (NOTE) SARS-CoV-2 target nucleic acids are NOT DETECTED.  The SARS-CoV-2 RNA is generally detectable in upper respiratory specimens during the acute phase of infection. The lowest concentration of SARS-CoV-2 viral copies this assay can detect is 138 copies/mL. A negative result does not preclude SARS-Cov-2 infection and should not be used as the sole basis for treatment or other patient management decisions. A negative result may  occur with  improper specimen collection/handling, submission of specimen other than nasopharyngeal swab, presence of viral mutation(s) within the areas targeted by this assay, and inadequate number of viral copies(<138 copies/mL). A negative result must be combined with clinical observations, patient history, and epidemiological information. The expected result is Negative.  Fact Sheet for Patients:  EntrepreneurPulse.com.au  Fact Sheet for Healthcare Providers:  IncredibleEmployment.be  This test is no t yet approved or cleared by the [place] FDA and  has been authorized for detection and/or diagnosis of SARS-CoV-2 by FDA under an Emergency Use Authorization (  EUA). This EUA will remain  in effect (meaning this test can be used) for the duration of the COVID-19 declaration under Section 564(b)(1) of the Act, 21 U.S.C.section 360bbb-3(b)(1), unless the authorization is terminated  or revoked sooner.       Influenza A by PCR NEGATIVE NEGATIVE Final   Influenza B by PCR NEGATIVE NEGATIVE Final    Comment: (NOTE) The Xpert Xpress SARS-CoV-2/FLU/RSV plus assay is intended as an aid in the diagnosis of influenza from Nasopharyngeal swab specimens and should not be used as a sole basis for treatment. Nasal washings and aspirates are unacceptable for Xpert Xpress SARS-CoV-2/FLU/RSV testing.  Fact Sheet for Patients: EntrepreneurPulse.com.au  Fact Sheet for Healthcare Providers: IncredibleEmployment.be  This test is not yet approved or cleared by the [place] FDA and has been authorized for detection and/or diagnosis of SARS-CoV-2 by FDA under an Emergency Use Authorization (EUA). This EUA will remain in effect (meaning this test can be used) for the duration of the COVID-19 declaration under Section 564(b)(1) of the Act, 21 U.S.C. section 360bbb-3(b)(1), unless the authorization is terminated  or revoked.  Performed at [facility], [place] [address] [place], [place] [address]    Time coordinating discharge: 25 minutes  SIGNED: Antonieta Pert, MD  [facility] 09/12/2020, 10:52 AM  If 7PM-7AM, please contact night-coverage [email or web address]

## 2020-09-12 NOTE — Progress Notes (Signed)
Subjective: States his pain is better in his RLQ.  Still having pain from his ribs.  Tolerating full liquids with no issues.  Had a BM yesterday  Objective: Vital signs in last 24 hours: Temp:  [97.6 F (36.4 C)-98 F (36.7 C)] 98 F (36.7 C) (08/19 0315) Pulse Rate:  [62-72] 65 (08/19 0315) Resp:  [14-18] 18 (08/19 0315) BP: (121-143)/(70-91) 121/71 (08/19 0315) SpO2:  [92 %-100 %] 94 % (08/19 0315) Last BM Date: 09/10/20  Intake/Output from previous day: 08/18 0701 - 08/19 0700 In: 310 [P.O.:120; I.V.:40; IV Piggyback:150] Out: 1550 [Urine:1550] Intake/Output this shift: No intake/output data recorded.  PE: Gen:  Alert, NAD, pleasant Abd: Soft, ND, RLQ tenderness improved, still present but mild. +BS.  Psych: A&Ox3    Lab Results:  Recent Labs    09/10/20 1324 09/11/20 0336  WBC 6.8 6.3  HGB 8.8* 8.4*  HCT 28.8* 27.1*  PLT 258 231   BMET Recent Labs    09/10/20 1324 09/11/20 0336  NA 137 138  K 3.2* 3.5  CL 101 102  CO2 26 26  GLUCOSE 87 81  BUN 12 9  CREATININE 0.58* 0.60*  CALCIUM 8.8* 9.0   PT/INR No results for input(s): LABPROT, INR in the last 72 hours. CMP     Component Value Date/Time   NA 138 09/11/2020 0336   K 3.5 09/11/2020 0336   CL 102 09/11/2020 0336   CO2 26 09/11/2020 0336   GLUCOSE 81 09/11/2020 0336   BUN 9 09/11/2020 0336   CREATININE 0.60 (L) 09/11/2020 0336   CALCIUM 9.0 09/11/2020 0336   PROT 6.5 09/10/2020 1324   ALBUMIN 2.9 (L) 09/10/2020 1324   AST 12 (L) 09/10/2020 1324   ALT 10 09/10/2020 1324   ALKPHOS 175 (H) 09/10/2020 1324   BILITOT 0.6 09/10/2020 1324   GFRNONAA >60 09/11/2020 0336   Lipase  No results found for: LIPASE  Studies/Results: DG Chest Port 1 View  Result Date: 09/10/2020 CLINICAL DATA:  Preop EXAM: PORTABLE CHEST 1 VIEW COMPARISON:  None. FINDINGS: The heart is enlarged. The mediastinal contours are within normal limits for there is calcified atherosclerotic plaque of the aortic  arch. There is no focal consolidation or pulmonary edema. There is no pleural effusion or pneumothorax. The bones are unremarkable. IMPRESSION: Cardiomegaly. Otherwise, no radiographic evidence of acute cardiopulmonary process. Electronically Signed   By: Valetta Mole M.D.   On: 09/10/2020 14:46    Anti-infectives: Anti-infectives (From admission, onward)    Start     Dose/Rate Route Frequency Ordered Stop   09/11/20 0830  piperacillin-tazobactam (ZOSYN) IVPB 3.375 g        3.375 g 12.5 mL/hr over 240 Minutes Intravenous Every 8 hours 09/11/20 0741     09/10/20 1330  piperacillin-tazobactam (ZOSYN) IVPB 3.375 g        3.375 g 100 mL/hr over 30 Minutes Intravenous  Once 09/10/20 1323 09/10/20 1529        Assessment/Plan Perforated appendicitis w/ phlegmon vs abscess - phlegmon present and not amendable to IR drainage - WBC wnl. Cont abx for 10 days total. - Adv to soft diet - improving.  Ok for DC home from our standpoint with follow up with Dr. Rosendo Gros in 3 weeks - He had been planned for surgery with Dr. Serafina Royals on Monday    FEN: soft ID: [email or web address], will send augmentin to pharmacy for 10 days total VTE: okay for chemical prophylaxis from our standpoint  Chronic mesenteric ischemia (CMS-HCC) - Hx of prior SMA and Celiac stent 5/22. CTA 8/15 showed Occluded celiac and superior mesenteric artery ostial stents, with collateral arterial supply from patent inferior mesenteric artery. Was planned for surgery with Dr. Serafina Royals on Monday.  Pancreatic pseudocyst HLD HTN Chronic atrial fibrillation - not on anticoagulation currently. Okay for heparin gtt if needed Depression/anxiety DM   LOS: 1 day    Henreitta Cea , [facility] 09/12/2020, 9:18 AM Please see Amion for pager number during day hours 7:00am-4:30pm

## 2020-09-15 ENCOUNTER — Observation Stay (HOSPITAL_COMMUNITY): Payer: BC Managed Care – PPO

## 2020-09-15 ENCOUNTER — Other Ambulatory Visit: Payer: Self-pay

## 2020-09-15 ENCOUNTER — Observation Stay (HOSPITAL_COMMUNITY): Admission: RE | Admit: 2020-09-15 | Payer: BC Managed Care – PPO | Source: Ambulatory Visit

## 2020-09-15 ENCOUNTER — Observation Stay (HOSPITAL_COMMUNITY)
Admission: RE | Admit: 2020-09-15 | Discharge: 2020-09-16 | Disposition: A | Discharge status: Home / Self Care | Payer: BC Managed Care – PPO | Source: Ambulatory Visit | Attending: Interventional Radiology | Admitting: Interventional Radiology

## 2020-09-15 ENCOUNTER — Encounter (HOSPITAL_COMMUNITY): Payer: Self-pay

## 2020-09-15 ENCOUNTER — Other Ambulatory Visit (HOSPITAL_COMMUNITY): Payer: Self-pay

## 2020-09-15 VITALS — BP 115/72 | HR 64 | Temp 98.2°F | Resp 18 | Ht 73.0 in | Wt 230.0 lb

## 2020-09-15 DIAGNOSIS — Z87891 Personal history of nicotine dependence: Secondary | ICD-10-CM | POA: Insufficient documentation

## 2020-09-15 DIAGNOSIS — I2510 Atherosclerotic heart disease of native coronary artery without angina pectoris: Secondary | ICD-10-CM | POA: Diagnosis not present

## 2020-09-15 DIAGNOSIS — Z7984 Long term (current) use of oral hypoglycemic drugs: Secondary | ICD-10-CM | POA: Insufficient documentation

## 2020-09-15 DIAGNOSIS — R109 Unspecified abdominal pain: Secondary | ICD-10-CM | POA: Insufficient documentation

## 2020-09-15 DIAGNOSIS — G4733 Obstructive sleep apnea (adult) (pediatric): Secondary | ICD-10-CM | POA: Insufficient documentation

## 2020-09-15 DIAGNOSIS — K551 Chronic vascular disorders of intestine: Secondary | ICD-10-CM | POA: Diagnosis present

## 2020-09-15 DIAGNOSIS — I4891 Unspecified atrial fibrillation: Secondary | ICD-10-CM | POA: Diagnosis not present

## 2020-09-15 DIAGNOSIS — K55059 Acute (reversible) ischemia of intestine, part and extent unspecified: Secondary | ICD-10-CM | POA: Diagnosis not present

## 2020-09-15 DIAGNOSIS — Z7982 Long term (current) use of aspirin: Secondary | ICD-10-CM | POA: Insufficient documentation

## 2020-09-15 DIAGNOSIS — Z79899 Other long term (current) drug therapy: Secondary | ICD-10-CM | POA: Insufficient documentation

## 2020-09-15 DIAGNOSIS — K559 Vascular disorder of intestine, unspecified: Secondary | ICD-10-CM | POA: Diagnosis present

## 2020-09-15 DIAGNOSIS — I10 Essential (primary) hypertension: Secondary | ICD-10-CM | POA: Insufficient documentation

## 2020-09-15 HISTORY — PX: IR US GUIDE VASC ACCESS RIGHT: IMG2390

## 2020-09-15 HISTORY — PX: IR TRANSCATH PLC STENT 1ST ART NOT LE CV CAR VERT CAR: IMG5443

## 2020-09-15 HISTORY — PX: IR THROMBECT PRIM MECH INIT (INCLU) MOD SED: IMG2297

## 2020-09-15 HISTORY — PX: IR AORTAGRAM ABDOMINAL SERIALOGRAM: IMG636

## 2020-09-15 LAB — COMPREHENSIVE METABOLIC PANEL
ALT: 13 U/L (ref 0–44)
AST: 17 U/L (ref 15–41)
Albumin: 3 g/dL — ABNORMAL LOW (ref 3.5–5.0)
Alkaline Phosphatase: 205 U/L — ABNORMAL HIGH (ref 38–126)
Anion gap: 7 (ref 5–15)
BUN: 13 mg/dL (ref 8–23)
CO2: 27 mmol/L (ref 22–32)
Calcium: 9 mg/dL (ref 8.9–10.3)
Chloride: 107 mmol/L (ref 98–111)
Creatinine, Ser: 0.66 mg/dL (ref 0.61–1.24)
GFR, Estimated: >60 mL/min (ref >60)
Glucose, Bld: 102 mg/dL — ABNORMAL HIGH (ref 70–99)
Potassium: 3.5 mmol/L (ref 3.5–5.1)
Sodium: 141 mmol/L (ref 135–145)
Total Bilirubin: 0.5 mg/dL (ref 0.3–1.2)
Total Protein: 7 g/dL (ref 6.5–8.1)

## 2020-09-15 LAB — CBC WITH DIFFERENTIAL/PLATELET
Abs Immature Granulocytes: 0.04 10*3/uL (ref 0.00–0.07)
Basophils Absolute: 0 10*3/uL (ref 0.0–0.1)
Basophils Relative: 0 %
Eosinophils Absolute: 0.1 10*3/uL (ref 0.0–0.5)
Eosinophils Relative: 1 %
HCT: 31.7 % — ABNORMAL LOW (ref 39.0–52.0)
Hemoglobin: 9.8 g/dL — ABNORMAL LOW (ref 13.0–17.0)
Immature Granulocytes: 1 %
Lymphocytes Relative: 14 %
Lymphs Abs: 1.2 10*3/uL (ref 0.7–4.0)
MCH: 28.6 pg (ref 26.0–34.0)
MCHC: 30.9 g/dL (ref 30.0–36.0)
MCV: 92.4 fL (ref 80.0–100.0)
Monocytes Absolute: 0.5 10*3/uL (ref 0.1–1.0)
Monocytes Relative: 5 %
Neutro Abs: 6.6 10*3/uL (ref 1.7–7.7)
Neutrophils Relative %: 79 %
Platelets: 260 10*3/uL (ref 150–400)
RBC: 3.43 MIL/uL — ABNORMAL LOW (ref 4.22–5.81)
RDW: 15.5 % (ref 11.5–15.5)
WBC: 8.4 10*3/uL (ref 4.0–10.5)
nRBC: 0 % (ref 0.0–0.2)

## 2020-09-15 LAB — LACTIC ACID, PLASMA: Lactic Acid, Venous: 0.9 mmol/L (ref 0.5–1.9)

## 2020-09-15 LAB — CBC
HCT: 30.6 % — ABNORMAL LOW (ref 39.0–52.0)
Hemoglobin: 9.2 g/dL — ABNORMAL LOW (ref 13.0–17.0)
MCH: 28.3 pg (ref 26.0–34.0)
MCHC: 30.1 g/dL (ref 30.0–36.0)
MCV: 94.2 fL (ref 80.0–100.0)
Platelets: 306 10*3/uL (ref 150–400)
RBC: 3.25 MIL/uL — ABNORMAL LOW (ref 4.22–5.81)
RDW: 15.6 % — ABNORMAL HIGH (ref 11.5–15.5)
WBC: 7.7 10*3/uL (ref 4.0–10.5)
nRBC: 0 % (ref 0.0–0.2)

## 2020-09-15 LAB — PROTIME-INR
INR: 1 (ref 0.8–1.2)
Prothrombin Time: 13.5 seconds (ref 11.4–15.2)

## 2020-09-15 LAB — POCT ACTIVATED CLOTTING TIME
Activated Clotting Time: 127 seconds
Activated Clotting Time: 184 seconds
Activated Clotting Time: 190 seconds

## 2020-09-15 LAB — LIPASE, BLOOD: Lipase: 21 U/L (ref 11–51)

## 2020-09-15 MED ORDER — ASPIRIN EC 81 MG PO TBEC
81.0000 mg | DELAYED_RELEASE_TABLET | Freq: Every day | ORAL | Status: DC
  Administered 2020-09-16: 81 mg via ORAL
  Filled 2020-09-15: qty 1

## 2020-09-15 MED ORDER — HEPARIN SODIUM (PORCINE) 1000 UNIT/ML IJ SOLN
INTRAMUSCULAR | Status: AC
  Filled 2020-09-15: qty 1

## 2020-09-15 MED ORDER — IOHEXOL 240 MG/ML SOLN
50.0000 mL | Freq: Once | INTRAMUSCULAR | Status: AC | PRN
  Administered 2020-09-15: 50 mL via INTRA_ARTERIAL

## 2020-09-15 MED ORDER — FUROSEMIDE 20 MG PO TABS
10.0000 mg | ORAL_TABLET | Freq: Every day | ORAL | Status: DC
  Administered 2020-09-15: 10 mg via ORAL
  Filled 2020-09-15: qty 1

## 2020-09-15 MED ORDER — ACETAMINOPHEN 500 MG PO TABS
1000.0000 mg | ORAL_TABLET | Freq: Four times a day (QID) | ORAL | Status: DC
  Administered 2020-09-15 – 2020-09-16 (×3): 1000 mg via ORAL
  Filled 2020-09-15 (×3): qty 2

## 2020-09-15 MED ORDER — ADULT MULTIVITAMIN W/MINERALS CH
1.0000 | ORAL_TABLET | Freq: Every day | ORAL | Status: DC
  Administered 2020-09-15 – 2020-09-16 (×2): 1 via ORAL
  Filled 2020-09-15 (×2): qty 1

## 2020-09-15 MED ORDER — SODIUM CHLORIDE 0.9 % IV BOLUS
1000.0000 mL | Freq: Once | INTRAVENOUS | Status: AC
  Administered 2020-09-15: 1000 mL via INTRAVENOUS

## 2020-09-15 MED ORDER — LISINOPRIL 20 MG PO TABS
30.0000 mg | ORAL_TABLET | Freq: Two times a day (BID) | ORAL | Status: DC
  Administered 2020-09-15 – 2020-09-16 (×2): 30 mg via ORAL
  Filled 2020-09-15 (×2): qty 1

## 2020-09-15 MED ORDER — FENTANYL CITRATE (PF) 100 MCG/2ML IJ SOLN
INTRAMUSCULAR | Status: AC
  Filled 2020-09-15: qty 2

## 2020-09-15 MED ORDER — ONDANSETRON HCL 4 MG/2ML IJ SOLN: 4.0000 mg | Freq: Four times a day (QID) | INTRAMUSCULAR | Status: DC | PRN

## 2020-09-15 MED ORDER — OXYCODONE HCL 5 MG PO TABS
10.0000 mg | ORAL_TABLET | ORAL | Status: DC | PRN
  Administered 2020-09-15 (×2): 10 mg via ORAL
  Filled 2020-09-15 (×2): qty 2

## 2020-09-15 MED ORDER — IOHEXOL 350 MG/ML SOLN
100.0000 mL | Freq: Once | INTRAVENOUS | Status: AC | PRN
  Administered 2020-09-15: 100 mL via INTRAVENOUS

## 2020-09-15 MED ORDER — FENTANYL CITRATE (PF) 100 MCG/2ML IJ SOLN
INTRAMUSCULAR | Status: AC | PRN
  Administered 2020-09-15 (×4): 25 ug via INTRAVENOUS
  Administered 2020-09-15: 50 ug via INTRAVENOUS
  Administered 2020-09-15: 25 ug via INTRAVENOUS

## 2020-09-15 MED ORDER — MIDAZOLAM HCL 2 MG/2ML IJ SOLN
INTRAMUSCULAR | Status: AC | PRN
  Administered 2020-09-15: 1 mg via INTRAVENOUS
  Administered 2020-09-15 (×5): 0.5 mg via INTRAVENOUS

## 2020-09-15 MED ORDER — MIDAZOLAM HCL 2 MG/2ML IJ SOLN
INTRAMUSCULAR | Status: AC
  Filled 2020-09-15: qty 2

## 2020-09-15 MED ORDER — IOHEXOL 240 MG/ML SOLN
50.0000 mL | Freq: Once | INTRAMUSCULAR | Status: AC | PRN
  Administered 2020-09-15: 35 mL via INTRA_ARTERIAL

## 2020-09-15 MED ORDER — ATORVASTATIN CALCIUM 10 MG PO TABS
10.0000 mg | ORAL_TABLET | Freq: Every day | ORAL | Status: DC
  Administered 2020-09-16: 10 mg via ORAL
  Filled 2020-09-15 (×2): qty 1

## 2020-09-15 MED ORDER — SERTRALINE HCL 100 MG PO TABS
100.0000 mg | ORAL_TABLET | Freq: Every day | ORAL | Status: DC
  Administered 2020-09-16: 100 mg via ORAL
  Filled 2020-09-15: qty 1

## 2020-09-15 MED ORDER — HEPARIN (PORCINE) 25000 UT/250ML-% IV SOLN
1600.0000 [IU]/h | INTRAVENOUS | Status: DC
  Administered 2020-09-15: 1400 [IU]/h via INTRAVENOUS
  Filled 2020-09-15: qty 250

## 2020-09-15 MED ORDER — PANTOPRAZOLE SODIUM 40 MG PO TBEC
40.0000 mg | DELAYED_RELEASE_TABLET | Freq: Two times a day (BID) | ORAL | Status: DC
  Administered 2020-09-15 – 2020-09-16 (×2): 40 mg via ORAL
  Filled 2020-09-15 (×2): qty 1

## 2020-09-15 MED ORDER — LACTATED RINGERS IV SOLN: INTRAVENOUS | Status: DC

## 2020-09-15 MED ORDER — CARVEDILOL 12.5 MG PO TABS
12.5000 mg | ORAL_TABLET | Freq: Two times a day (BID) | ORAL | Status: DC
  Administered 2020-09-15 – 2020-09-16 (×2): 12.5 mg via ORAL
  Filled 2020-09-15 (×2): qty 1

## 2020-09-15 MED ORDER — HEPARIN SODIUM (PORCINE) 1000 UNIT/ML IJ SOLN
INTRAMUSCULAR | Status: AC | PRN
  Administered 2020-09-15: 3000 [IU] via INTRAVENOUS
  Administered 2020-09-15: 5000 [IU] via INTRAVENOUS
  Administered 2020-09-15: 3000 [IU] via INTRAVENOUS

## 2020-09-15 MED ORDER — LIDOCAINE HCL 1 % IJ SOLN
INTRAMUSCULAR | Status: AC
  Filled 2020-09-15: qty 20

## 2020-09-15 MED ORDER — DILTIAZEM HCL ER COATED BEADS 240 MG PO CP24
240.0000 mg | ORAL_CAPSULE | Freq: Every day | ORAL | Status: DC
  Administered 2020-09-16: 240 mg via ORAL
  Filled 2020-09-15: qty 1

## 2020-09-15 MED ORDER — CEFAZOLIN SODIUM-DEXTROSE 2-4 GM/100ML-% IV SOLN
INTRAVENOUS | Status: AC | PRN
  Administered 2020-09-15: 2 g via INTRAVENOUS

## 2020-09-15 MED ORDER — PIPERACILLIN-TAZOBACTAM IN DEX 2-0.25 GM/50ML IV SOLN
2.2500 g | Freq: Once | INTRAVENOUS | Status: AC
  Administered 2020-09-15: 2.25 g via INTRAVENOUS
  Filled 2020-09-15: qty 50

## 2020-09-15 MED ORDER — LIDOCAINE HCL (PF) 1 % IJ SOLN
INTRAMUSCULAR | Status: AC | PRN
Start: 2020-09-15 — End: 2020-09-15
  Administered 2020-09-15: 5 mL

## 2020-09-15 MED ORDER — CEFAZOLIN SODIUM-DEXTROSE 2-4 GM/100ML-% IV SOLN
INTRAVENOUS | Status: AC
  Filled 2020-09-15: qty 100

## 2020-09-15 MED ORDER — SODIUM CHLORIDE 0.9 % IV SOLN: INTRAVENOUS | Status: DC

## 2020-09-15 MED ORDER — AMOXICILLIN-POT CLAVULANATE 875-125 MG PO TABS
1.0000 | ORAL_TABLET | Freq: Two times a day (BID) | ORAL | Status: DC
  Administered 2020-09-15 – 2020-09-16 (×2): 1 via ORAL
  Filled 2020-09-15 (×2): qty 1

## 2020-09-15 MED ORDER — OXYCODONE HCL 5 MG PO TABS: 5.0000 mg | ORAL_TABLET | ORAL | Status: DC | PRN

## 2020-09-15 MED ORDER — SODIUM CHLORIDE 0.9 % IV SOLN
INTRAVENOUS | Status: AC | PRN
  Administered 2020-09-15: 10 mL/h via INTRAVENOUS

## 2020-09-15 MED ORDER — HYDRALAZINE HCL 20 MG/ML IJ SOLN: 10.0000 mg | Freq: Four times a day (QID) | INTRAMUSCULAR | Status: DC | PRN

## 2020-09-15 MED ORDER — VITAMIN B-12 1000 MCG PO TABS
5000.0000 ug | ORAL_TABLET | Freq: Every day | ORAL | Status: DC
  Administered 2020-09-16: 5000 ug via ORAL
  Filled 2020-09-15: qty 5

## 2020-09-15 MED ORDER — CLOPIDOGREL BISULFATE 75 MG PO TABS
75.0000 mg | ORAL_TABLET | Freq: Every day | ORAL | Status: DC
  Administered 2020-09-16: 75 mg via ORAL
  Filled 2020-09-15: qty 1

## 2020-09-15 MED ORDER — CLOPIDOGREL BISULFATE 75 MG PO TABS
300.0000 mg | ORAL_TABLET | Freq: Once | ORAL | Status: AC
  Administered 2020-09-15: 300 mg via ORAL
  Filled 2020-09-15: qty 4

## 2020-09-15 MED ORDER — COENZYME Q10 100 MG PO CAPS: 100.0000 mg | ORAL_CAPSULE | Freq: Every day | ORAL | Status: DC

## 2020-09-15 NOTE — Sedation Documentation (Signed)
Right groin sheath removed 6 Fr. angioseal deployed

## 2020-09-15 NOTE — Progress Notes (Addendum)
Pt c/o excruciating pain 9/10 in mid lower abdomen and across. Mainly mid abdomen through LLQ. R groin site fine with no hematoma. Pain onset started before 1430 and administered prn oxy with minimal effect.  Now c/o increasing pain. States he's been having abdominal pain; however, this pain is much severe than prior to surgery. Administered prn oxy and paged IR on call Dr. Earleen Newport.  Received order for STAT CBC with diff, lactic acid and heparin per Rx consult.  Dr. Earleen Newport en route to assess him.  Idolina Primer, RN

## 2020-09-15 NOTE — H&P (Signed)
Chief Complaint: Patient was seen in consultation today for SMA stent recanalization  at the request of Dr Donley Redder  Supervising Physician: Ruthann Cancer  Patient Status: [facility] - Out-pt  History of Present Illness: Benjamin Fox is a 65 y.o. 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 Renard Matter).  His pain temporarily improved, then worsened soon after.  Further imaging demonstrated development of acute pancreatitis and thrombosis of both stents.  His pancreatitis was complicated by acute pancreatic fluid collections which evolved into walled-off necrosis.  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  Angiogram was attempted by [facility] Interventional Radiology from radial and femoral approaches without success identifying supply to the pseudoaneurysm from IMA/Arc of Riolan catheterization nor cannulation of the indwelling occluded celiac and SMA stents.  Given walled off necrosis, Gastroenterology is hesitant to intervene or drain endoscopically due to presence of pseudoaneurysm.  Given history of atrial fibrillation, he has been on anticoagulation for years, formerly coumadin, more recently Xarelto.  This was discontinued at recent hospital discharge due to development of the pseudoaneurysm, and he is now on 325 mg aspirin daily.     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  Prior attempts at recanalizing the occluded stents as well as accessing the pseudoaneurysm via the Arc of Riolan were unsuccessful.  Follow up CTA demonstrated spontaneous resolution of the pseudoaneurysm and improvement of pancreatitis complications.     He has had improvement in abdominal pain his initial presentation, but continues to have consistent post-prandial pain.  Given stenotic put patent IMA supplying the entire viscera, I am concerned his prognosis for sustained patency is grim.  We discussed options including continued watchful waiting in addition to attempt at recanalization of the SMA stent.  He is hopeful to stay ahead of worsening ischemia, and is amenable to stent recanlization.  Given anatomy of patent SMA just beyond distal aspect of stent, and favorable angulation with minimal aortic overhang, I think it is reasonable to attempt stent recanalization.  This may require advanced techniques including laser atherectomy to obtain a patent channel.  If able to recanalize, will then plan to place covered stent.     Plan for mesenteric angiogram with SMA stent recanalization at [facility].  Plan for moderate sedation and overnight observation.  Will need to obtain CTA abdomen and pelvis approximately 1 week prior to procedure.   Pt was admitted to [facility] 8/17-19 Abd pain Patient planned for SMA stent recanalization in IR with Dr. Serafina Royals 09/15/20 at 8:30 am - patient admitted 8/17 for perforated appendicitis. He is currently receiving IV antibiotics with improved pain control per [facility]. Discharged 09/12/20.  Discussed patient with Dr. Serafina Royals today, he plans to proceed with SMA stent recanalization on Monday 8/22 as planned. If he is to be discharged before that time he should return to IR on Monday at the assigned time for pre-procedure workup/consult/consent  Scheduled now for Mesenteric arteriogram with possible SMA stent recanalization   Past Surgical  History:  Procedure Laterality Date   IR RADIOLOGIST EVAL & MGMT  07/25/2020   IR RADIOLOGIST EVAL & MGMT  08/28/2020    Allergies: Patient has no known allergies.  Medications: Prior to Admission medications   Medication Sig Start Date End Date Taking? Authorizing Provider  amoxicillin-clavulanate (AUGMENTIN) 875-125 MG tablet Take 1 tablet by mouth 2 (two) times daily for 9 days. 09/12/20 09/21/20 Yes Saverio Danker, PA-C  aspirin EC 81 MG tablet Take 81 mg by mouth daily. Swallow whole.   Yes [provider]  atorvastatin (LIPITOR) 20 MG tablet Take 10 mg by mouth daily. 06/24/20  Yes [provider]  carvedilol (COREG) 12.5 MG tablet Take 12.5 mg by mouth 2 (two) times daily. 06/09/20  Yes [provider]  Coenzyme Q10 100 MG capsule Take 100 mg by mouth daily.   Yes [provider]  Cyanocobalamin 5000 MCG SUBL Place 5,000 mcg under the tongue daily. 10/28/15  Yes [provider]  diltiazem (CARDIZEM CD) 240 MG 24 hr capsule Take 240 mg by mouth daily. 07/20/20  Yes [provider]  furosemide (LASIX) 20 MG tablet Take 10 mg by mouth daily. 08/18/15  Yes [provider]  lisinopril (ZESTRIL) 30 MG tablet Take 30 mg by mouth 2 (two) times daily. 07/16/20  Yes [provider]  metFORMIN (GLUCOPHAGE) 1000 MG tablet Take 500 mg by mouth 2 (two) times daily. 06/09/20  Yes [provider]  oxyCODONE (OXY IR/ROXICODONE) 5 MG immediate release tablet Take 1 tablet (5 mg total) by mouth every 4 (four) hours as needed for moderate pain. 09/12/20  Yes Saverio Danker, PA-C  pantoprazole (PROTONIX) 40 MG tablet Take 40 mg by mouth 2 (two) times daily. 04/12/20  Yes [provider]  sertraline (ZOLOFT) 100 MG tablet Take 100 mg by mouth daily. 08/05/20  Yes [provider]  acetaminophen (TYLENOL) 500 MG tablet Take 2 tablets (1,000 mg total) by mouth every 6 (six) hours as needed for mild pain (or Fever >/= 101).  09/12/20   Saverio Danker, PA-C  HYDROcodone-acetaminophen (NORCO/VICODIN) 5-325 MG tablet Take 1 tablet by mouth every 6 (six) hours as needed for severe pain. 09/09/20   [provider]  Multiple Vitamins-Minerals (MULTIVITAMIN WITH MINERALS) tablet Take 1 tablet by mouth daily.    [provider]     History reviewed. No pertinent family history.  Social History   Socioeconomic History   Marital status: Married    Spouse name: Not on file   Number of children: Not on file   Years of education: Not on file   Highest education level: Not on file  Occupational History   Not on file  Tobacco Use   Smoking status: Former    Types: Cigarettes   Smokeless tobacco: Never  Vaping Use   Vaping Use: Never used  Substance and Sexual Activity   Alcohol use: Not Currently   Drug use: Never   Sexual activity: Not on file  Other Topics Concern   Not on file  Social History Narrative   Not on file   Social Determinants of Health   Financial Resource Strain: Not on file  Food Insecurity: Not on file  Transportation Needs: Not on file  Physical Activity: Not on file  Stress: Not on file  Social Connections: Not on file     Review of Systems: A 12 point ROS discussed and  pertinent positives are indicated in the HPI above.  All other systems are negative.  Review of Systems  Constitutional:  Positive for activity change and appetite change. Negative for fever and unexpected weight change.  Respiratory:  Negative for cough and shortness of breath.   Gastrointestinal:  Positive for abdominal pain.  Neurological:  Negative for weakness.  Psychiatric/Behavioral:  Negative for behavioral problems and confusion.    Vital Signs: BP 129/63   Pulse 75   Temp 97.8 F (36.6 C) (Oral)   Resp 16   Ht '6\' 1"'$  (1.854 m)   Wt 230 lb (104.3 kg)   SpO2 98%   BMI 30.34 kg/m   Physical Exam Vitals reviewed.  HENT:     Mouth/Throat:     Mouth: Mucous membranes are moist.   Cardiovascular:     Rate and Rhythm: Normal rate and regular rhythm.     Heart sounds: Normal heart sounds.  Pulmonary:     Effort: Pulmonary effort is normal.     Breath sounds: Normal breath sounds.  Abdominal:     Palpations: Abdomen is soft.     Tenderness: There is no abdominal tenderness.  Musculoskeletal:        General: Normal range of motion.  Skin:    General: Skin is warm.  Neurological:     Mental Status: He is alert and oriented to person, place, and time.  Psychiatric:        Behavior: Behavior normal.    Imaging: DG Chest Port 1 View  Result Date: 09/10/2020 CLINICAL DATA:  Preop EXAM: PORTABLE CHEST 1 VIEW COMPARISON:  None. FINDINGS: The heart is enlarged. The mediastinal contours are within normal limits for there is calcified atherosclerotic plaque of the aortic arch. There is no focal consolidation or pulmonary edema. There is no pleural effusion or pneumothorax. The bones are unremarkable. IMPRESSION: Cardiomegaly. Otherwise, no radiographic evidence of acute cardiopulmonary process. Electronically Signed   By: Valetta Mole M.D.   On: 09/10/2020 14:46   IR Radiologist Eval & Mgmt  Result Date: 08/28/2020 Please refer to notes tab for details about interventional procedure. (Op Note)  CT Angio Abd/Pel w/ and/or w/o  Result Date: 09/08/2020 CLINICAL DATA:  Chronic mesenteric ischemia, left-sided abdominal pain EXAM: CTA ABDOMEN AND PELVIS WITH CONTRAST TECHNIQUE: Multidetector CT imaging of the abdomen and pelvis was performed using the standard protocol during bolus administration of intravenous contrast. Multiplanar reconstructed images and MIPs were obtained and reviewed to evaluate the vascular anatomy. CONTRAST:  153m OMNIPAQUE IOHEXOL 300 MG/ML  SOLN COMPARISON:  07/29/2020 FINDINGS: VASCULAR Aorta: Moderate calcified plaque. No aneurysm, dissection, or stenosis. Celiac: Occluded stent across the origin, protrudes approximately 1 cm into the lumen of the  aorta. Collateral reconstitution of the trifurcation vessels. SMA: Occluded stent across the origin, protrudes approximately 0.5 cm into the lumen of the aorta. There is angulation of the native vessel just distal to the stent with short-segment occlusion. Collateral reconstitution of the distal SMA which is moderately atheromatous. Renals: Single left, with calcified ostial plaque, no significant stenosis. Duplicated right, superior dominant, both with proximal short-segment stenoses of at least mild hemodynamic significance, patent distally. IMA: Mild short-segment origin stenosis related to aortic wall plaque, dilated distally with prominent arc of Riolan collateral supply to SMA distribution. Inflow: Moderate calcified plaque, ectatic. No dissection or stenosis. Proximal Outflow: Ectatic, atheromatous, patent. Veins: Tapered narrowing of the splenic vein to the level of the pancreatic tail. Patent IMV, SMV, and portal vein. Patent bilateral  renal veins. Review of the MIP images confirms the above findings. NON-VASCULAR Lower chest: No pleural or pericardial effusion. Coronary calcifications. Calcified granuloma in the posterior basal segment right lower lobe. Hepatobiliary: No focal liver abnormality is seen. No gallstones, gallbladder wall thickening, or biliary dilatation. Pancreas: Scattered coarse calcifications in the pancreatic body and head. No discrete mass or ductal dilatation. Spleen: Normal in size.  Small calcified granulomas. Adrenals/Urinary Tract: Adrenal glands unremarkable. Stable bilateral renal cysts. No hydronephrosis. Urinary bladder incompletely distended. Stomach/Bowel: Stomach is decompressed. Small bowel is nondistended. 3.5 cm poorly marginated phlegmonous process adjacent to the appendix, with mild regional inflammatory/edematous change, new since previous. No discrete abscess. The colon is nondilated, unremarkable. Lymphatic: No abdominal or pelvic adenopathy. Reproductive: Mild  prostate enlargement. Other: Bilateral pelvic phleboliths.  No ascites.  No free air. Musculoskeletal: Mild multilevel lumbar spondylitic change. No fracture or worrisome bone lesion. IMPRESSION: 1. Periappendiceal 3.5 cm phlegmonous process with surrounding inflammatory change, new since 07/29/2020, suggesting smoldering appendicitis. No abscess. 2. Occluded celiac and superior mesenteric artery ostial stents, with collateral arterial supply from patent inferior mesenteric artery. 3. Coronary and Aortic Atherosclerosis (ICD10-170.0). Electronically Signed   By: Lucrezia Europe M.D.   On: 09/08/2020 16:41    Labs:  CBC: Recent Labs    09/10/20 1324 09/11/20 0336  WBC 6.8 6.3  HGB 8.8* 8.4*  HCT 28.8* 27.1*  PLT 258 231    COAGS: No results for input(s): INR, APTT in the last 8760 hours.  BMP: Recent Labs    09/08/20 1541 09/10/20 1324 09/11/20 0336  NA  --  137 138  K  --  3.2* 3.5  CL  --  101 102  CO2  --  26 26  GLUCOSE  --  87 81  BUN  --  12 9  CALCIUM  --  8.8* 9.0  CREATININE 0.70 0.58* 0.60*  GFRNONAA  --  >60 >60    LIVER FUNCTION TESTS: Recent Labs    09/10/20 1324  BILITOT 0.6  AST 12*  ALT 10  ALKPHOS 175*  PROT 6.5  ALBUMIN 2.9*    TUMOR MARKERS: No results for input(s): AFPTM, CEA, CA199, CHROMGRNA in the last 8760 hours.  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  Prior attempts at recanalizing the occluded stents as well as accessing the pseudoaneurysm via the Arc of Riolan were unsuccessful.  Follow up CTA demonstrated spontaneous resolution of the pseudoaneurysm and improvement of pancreatitis complications.    Scheduled now for mesenteric arteriogram with possible superior mesenteric artery stent recanalization with Dr  Serafina Royals Risks and benefits of procedure were discussed with the patient including, but not limited to bleeding, infection, vascular injury or contrast induced renal failure.  This interventional procedure involves the use of X-rays and because of the nature of the planned procedure, it is possible that we will have prolonged use of X-ray fluoroscopy.  Potential radiation risks to you include (but are not limited to) the following: - A slightly elevated risk for cancer  several years later in life. This risk is typically less than 0.5% percent. This risk is low in comparison to the normal incidence of human cancer, which is 33% for women and 50% for men according to the [place]. - Radiation induced injury can include skin redness, resembling  a rash, tissue breakdown / ulcers and hair loss (which can be temporary or permanent).   The likelihood of either of these occurring depends on the difficulty of the procedure and whether you are sensitive to radiation due to previous procedures, disease, or genetic conditions.   IF your procedure requires a prolonged use of radiation, you will be notified and given written instructions for further action.  It is your responsibility to monitor the irradiated area for the 2 weeks following the procedure and to notify your physician if you are concerned that you have suffered a radiation induced injury.    All of the patient's questions were answered, patient is agreeable to proceed. He is aware he may admitted overnight for observation - planned for discharge in am.  He is agreeable to plan Consent signed and in chart.    Thank you for this interesting consult.  I greatly enjoyed meeting Ferdinando Traino and look forward to participating in their care.  A copy of this report was sent to the requesting provider on this date.  Electronically Signed: Lavonia Drafts, PA-C 09/15/2020, 7:23 AM   I spent a total of  30 Minutes   in face to face  in clinical consultation, greater than 50% of which was counseling/coordinating care for Mesenteric arteriogram with SMA stent recanalization

## 2020-09-15 NOTE — Progress Notes (Signed)
ANTICOAGULATION CONSULT NOTE - Initial Consult  Pharmacy Consult for heparin while oral anticoagulation is on hold Indication: atrial fibrillation  No Known Allergies  Patient Measurements: Height: '6\' 1"'$  (185.4 cm) Weight: 104.3 kg (230 lb) IBW/kg (Calculated) : 79.9 Heparin Dosing Weight: 100kg  Vital Signs: Temp: 97.6 F (36.4 C) (08/22 1300) Temp Source: Oral (08/22 1300) BP: 129/81 (08/22 1842) Pulse Rate: 77 (08/22 1842)  Labs: Recent Labs    09/15/20 0630  HGB 9.2*  HCT 30.6*  PLT 306  LABPROT 13.5  INR 1.0  CREATININE 0.66    Estimated Creatinine Clearance: 113.7 mL/min (by C-G formula based on SCr of 0.66 mg/dL).   Medical History: History reviewed. No pertinent past medical history.  Medications:  Medications Prior to Admission  Medication Sig Dispense Refill Last Dose   amoxicillin-clavulanate (AUGMENTIN) 875-125 MG tablet Take 1 tablet by mouth 2 (two) times daily for 9 days. 18 tablet 0 09/15/2020 at 0500   aspirin EC 81 MG tablet Take 81 mg by mouth daily. Swallow whole.   09/15/2020 at 0500   atorvastatin (LIPITOR) 20 MG tablet Take 10 mg by mouth daily.   09/15/2020 at 0500   carvedilol (COREG) 12.5 MG tablet Take 12.5 mg by mouth 2 (two) times daily.   09/15/2020 at 0500   Coenzyme Q10 100 MG capsule Take 100 mg by mouth daily.   09/15/2020 at 0500   Cyanocobalamin 5000 MCG SUBL Place 5,000 mcg under the tongue daily.   09/15/2020 at 0500   diltiazem (CARDIZEM CD) 240 MG 24 hr capsule Take 240 mg by mouth daily.   09/15/2020 at 0500   furosemide (LASIX) 20 MG tablet Take 10 mg by mouth daily.   09/14/2020 at 1800   lisinopril (ZESTRIL) 30 MG tablet Take 30 mg by mouth 2 (two) times daily.   09/15/2020 at 0500   metFORMIN (GLUCOPHAGE) 1000 MG tablet Take 500 mg by mouth 2 (two) times daily.   09/15/2020 at 0500   oxyCODONE (OXY IR/ROXICODONE) 5 MG immediate release tablet Take 1 tablet (5 mg total) by mouth every 4 (four) hours as needed for moderate pain. 20  tablet 0 09/14/2020 at 2000   pantoprazole (PROTONIX) 40 MG tablet Take 40 mg by mouth 2 (two) times daily.   09/15/2020 at 0500   sertraline (ZOLOFT) 100 MG tablet Take 100 mg by mouth daily.   09/15/2020 at 0500   acetaminophen (TYLENOL) 500 MG tablet Take 2 tablets (1,000 mg total) by mouth every 6 (six) hours as needed for mild pain (or Fever >/= 101). (Patient not taking: Reported on 09/15/2020)   Not Taking   Multiple Vitamins-Minerals (MULTIVITAMIN WITH MINERALS) tablet Take 1 tablet by mouth daily. (Patient not taking: Reported on 09/15/2020)   Not Taking    Assessment: 65 year old male on oral anticoagulation for afib previously. This has been on hold for procedures. Patient admitted for overnight observation post mesenteric stent placement by IR this morning. Orders to start IV heparin tonight and possible discharge tomorrow.   Hemoglobin low but stable at 9.2 based on previous labs.   Goal of Therapy:  Heparin level 0.3-0.7 units/ml Monitor platelets by anticoagulation protocol: Yes   Plan:  Start heparin infusion at 1400 units/hr Check anti-Xa level in 8 hours and daily while on heparin Continue to monitor H&H and platelets  Erin Hearing PharmD., BCPS Clinical Pharmacist 09/15/2020 7:08 PM

## 2020-09-15 NOTE — Procedures (Signed)
Interventional Radiology Procedure Note  Procedure:  1) Mesenteric angiogram 2) SMA stent recanalization 3) SMA stent placement  Findings: Please refer to procedural dictation for full description.  Indwelling SMA stent occluded, unable to recanalize with catheter and wire techniques.  Laser atherectomy performed with 2.0 Fr Auryon catheter to create channel for recanalization.  Relined with 7 x 39 mm (distal) + 7 x 29 mm (proximal) VBX stent grafts, ostium flared to 9 mm.  Angioseal closure of right CFA access.  Complications: None immediate  Estimated Blood Loss: 20 mL  Recommendations: -Strict 4 hour bedrest - flat for 2 hours (until 12:45), then head of bed up to 30 degrees for 2 hours (until 14:45). -300 mg Plavix once today, start 75 mg Plavix daily tomorrow -continue Augmentin PO as prescribed -advance diet as tolerated -CBC, BMP, lactate in am -overnight observation admission to IR, plan for discharge home tomorrow    Ruthann Cancer, MD Pager: [phone]

## 2020-09-15 NOTE — Progress Notes (Addendum)
Supervising Physician: Corrie Mckusick  Patient Status: [facility] - In-pt  History of Present Illness: Benjamin Fox is a 65 y.o. male admitted to [facility], with history of chronic mesenteric ischemia (previously chronically occluded celiac and SMA stents), SP revascularization of occluded SMA today by VIR, now with worsening abdominal pain.   He is SP laser atherectomy, balloon PTA, and relining of SMA stent with covered gore stent today with VIR.   I was called due to worsening abdominal pain.   Mr Mcguirt tells me that he has had episodes of spiking lower abdominal pain, quite excruciating, spanning across his lower abdomen from right to left.  He denies fevers, rigors, chills,  He denies any nausea/vomiting.  He does report some decreased appetite.   He does report feeling somewhat better having taken oxycodone.    He has not moved his bowels today.  Reports little flatus.    His presentation is confounded by his recent appendicitis, currently with conservative management with general surgery on board.   VS's:  129/81 HR 77 RR: 16-17 O2: 98 on RA Temp: 97.6  Lags: Na: 141 BUN: 13 Cr: 0.66 K: 3.5 AST/ALT: 17/13 Alk phos: 205 Tbili: 0.5 Lipase: 21 INR 1.0  CBC: WBC: 7.7 H&H: 9.2/30.6 Platelets: 306   History reviewed. No pertinent past medical history.  Past Surgical History:  Procedure Laterality Date   IR AORTAGRAM ABDOMINAL SERIALOGRAM  09/15/2020   IR RADIOLOGIST EVAL & MGMT  07/25/2020   IR RADIOLOGIST EVAL & MGMT  08/28/2020   IR THROMBECT PRIM MECH INIT (INCLU) MOD SED  09/15/2020   IR TRANSCATH PLC STENT 1ST ART NOT LE CV CAR VERT CAR  09/15/2020   IR US GUIDE VASC ACCESS RIGHT  09/15/2020    Allergies: Patient has no known allergies.  Medications: Prior to Admission medications   Medication Sig Start Date End Date Taking? Authorizing Provider  amoxicillin-clavulanate (AUGMENTIN) 875-125 MG tablet Take 1 tablet by mouth 2 (two) times daily for 9 days.  09/12/20 09/21/20 Yes Saverio Danker, PA-C  aspirin EC 81 MG tablet Take 81 mg by mouth daily. Swallow whole.   Yes [provider]  atorvastatin (LIPITOR) 20 MG tablet Take 10 mg by mouth daily. 06/24/20  Yes [provider]  carvedilol (COREG) 12.5 MG tablet Take 12.5 mg by mouth 2 (two) times daily. 06/09/20  Yes [provider]  Coenzyme Q10 100 MG capsule Take 100 mg by mouth daily.   Yes [provider]  Cyanocobalamin 5000 MCG SUBL Place 5,000 mcg under the tongue daily. 10/28/15  Yes [provider]  diltiazem (CARDIZEM CD) 240 MG 24 hr capsule Take 240 mg by mouth daily. 07/20/20  Yes [provider]  furosemide (LASIX) 20 MG tablet Take 10 mg by mouth daily. 08/18/15  Yes [provider]  lisinopril (ZESTRIL) 30 MG tablet Take 30 mg by mouth 2 (two) times daily. 07/16/20  Yes [provider]  metFORMIN (GLUCOPHAGE) 1000 MG tablet Take 500 mg by mouth 2 (two) times daily. 06/09/20  Yes [provider]  oxyCODONE (OXY IR/ROXICODONE) 5 MG immediate release tablet Take 1 tablet (5 mg total) by mouth every 4 (four) hours as needed for moderate pain. 09/12/20  Yes Saverio Danker, PA-C  pantoprazole (PROTONIX) 40 MG tablet Take 40 mg by mouth 2 (two) times daily. 04/12/20  Yes [provider]  sertraline (ZOLOFT) 100 MG tablet Take 100 mg by mouth daily. 08/05/20  Yes [provider]  acetaminophen (  TYLENOL) 500 MG tablet Take 2 tablets (1,000 mg total) by mouth every 6 (six) hours as needed for mild pain (or Fever >/= 101). Patient not taking: Reported on 09/15/2020 09/12/20   Saverio Danker, PA-C  Multiple Vitamins-Minerals (MULTIVITAMIN WITH MINERALS) tablet Take 1 tablet by mouth daily. Patient not taking: Reported on 09/15/2020    [provider]     History reviewed. No pertinent family history.  Social History   Socioeconomic History   Marital status: Married    Spouse name: Not on file    Number of children: Not on file   Years of education: Not on file   Highest education level: Not on file  Occupational History   Not on file  Tobacco Use   Smoking status: Former    Types: Cigarettes   Smokeless tobacco: Never  Vaping Use   Vaping Use: Never used  Substance and Sexual Activity   Alcohol use: Not Currently   Drug use: Never   Sexual activity: Not on file  Other Topics Concern   Not on file  Social History Narrative   Not on file   Social Determinants of Health   Financial Resource Strain: Not on file  Food Insecurity: Not on file  Transportation Needs: Not on file  Physical Activity: Not on file  Stress: Not on file  Social Connections: Not on file       Review of Systems: A 12 point ROS discussed and pertinent positives are indicated in the HPI above.  All other systems are negative.  Review of Systems  Vital Signs: BP 129/81   Pulse 77   Temp 97.6 F (36.4 C) (Oral)   Resp 17   Ht _0  (1.854 m)   Wt 104.3 kg   SpO2 98%   BMI 30.34 kg/m   Physical Exam General: 65 yo male appearing relatively comfortable.  Not toxic appearing.  HEENT: Glasses. Atraumatic, normocephalic.  Conjugate gaze, extra-ocular motor intact. No scleral icterus or scleral injection. No lesions on external ears, nose, lips, or gums.  Oral mucosa moist, pink.  Neck: Symmetric with no goiter enlargement.  Chest/Lungs:  Symmetric chest with inspiration/expiration.  No labored breathing.  Clear to auscultation with no wheezes, rhonchi, or rales.  Heart:  RRR, with no third heart sounds appreciated. No JVD appreciated.  Abdomen:  Reduced bowel sounds.  No rebound tenderness.  No bruit.  TTP worst RLQ   Genito-urinary: Deferred Neurologic: Alert & Oriented to person, place, and time.   Normal affect and insight.  Appropriate questions.  Moving all 4 extremities with gross sensory intact.  Pulse Exam:  Palpable right CFA.  No hematoma.  Clean dressing intact. Slight TTP rCFA  site.  Palpable right and left AT & PT.     Imaging: IR Aortagram Abdominal Serialogram  Result Date: 09/15/2020 INDICATION: 65 year old male with history of chronic mesenteric ischemia with occluded indwelling celiac and superior mesenteric artery stents. EXAM: 1. Ultrasound-guided vascular access of the right common femoral artery. 2. Abdominal aortogram. 3. Selective catheterization and angiography of the superior mesenteric artery. 4. Laser atherectomy and aspiration thrombectomy of the superior mesenteric artery. 5. Balloon angioplasty of the superior mesenteric artery. 6. Superior mesenteric artery stent placement. MEDICATIONS: Ancef 2 gm IV. The antibiotic was administered within 1 hour of the procedure. 11000 units heparin, intravenous. ANESTHESIA/SEDATION: Moderate (conscious) sedation was employed during this procedure. A total of Versed 3.5 mg and Fentanyl 175 mcg was administered intravenously. Moderate Sedation Time: 114 minutes. The  patient's level of consciousness and vital signs were monitored continuously by radiology nursing throughout the procedure under my direct supervision. CONTRAST:  72m OMNIPAQUE IOHEXOL 240 MG/ML SOLN, 556mOMNIPAQUE IOHEXOL 240 MG/ML SOLN, 5047mMNIPAQUE IOHEXOL 240 MG/ML SOLN FLUOROSCOPY TIME:  Fluoroscopy Time: 27 minutes 6 seconds (3,450 mGy). COMPLICATIONS: None immediate. PROCEDURE: Informed consent was obtained from the patient following explanation of the procedure, risks, benefits and alternatives. The patient understands, agrees and consents for the procedure. All questions were addressed. A time out was performed prior to the initiation of the procedure. Maximal barrier sterile technique utilized including caps, mask, sterile gowns, sterile gloves, large sterile drape, hand hygiene, and Betadine prep. The right groin is prepped and draped in standard fashion. Preprocedure ultrasound evaluation demonstrated patent right common femoral artery scattered  atherosclerotic calcifications. Procedure was planned. Subdermal Local anesthesia was provided with 1% lidocaine. A small skin nick was made. Under direct ultrasound visualization, a 21 gauge micropuncture needle was directed into the right common femoral artery. A permanent image was captured and stored. The needle was exchanged for the micropuncture sheath through which a limited right lower extremity angiogram was performed which demonstrated adequate puncture site for arterial closure device use. A J wire was inserted and directed to the abdominal aorta. The micropuncture sheath was removed and exchanged for a 5 French, 11 cm vascular sheath. A baseline ACT was obtained. Heparin was administered in boluses throughout the case to obtain adequate anticoagulation. An Omni flush catheter was directed to the level of the celiac trunk. Abdominal angiography was performed which demonstrated complete occlusion of the indwelling celiac and SMA stents. The dual right and single left renal arteries are patent. The IMA is widely patent with prominent arc of Riolan collateralization. The image intensifier was placed in the lateral position and limited abdominal angiogram was performed which again demonstrated complete occlusion of the indwelling celiac and SMA stents. The Omni flush and indwelling 5 French sheath were then removed and exchanged for a 7 [place] cm Tourguide steerable sheath. The sheath was directed to the SMA ostium which was proximally cannulated with a stiff Glidewire and Navicross catheter. There was total occlusion of the midportion of the stent which was unable to be recannulated with catheter and wire techniques. Therefore, the indwelling catheter and wire removed while the steerable sheath remain seated in the proximal SMA stent. In a bare back fashion, the 2.0 Auryon laser atherectomy catheter was then advanced to the midportion of the SMA stent and laser atherectomy with combined suction  aspiration was performed at 50 millijoules/millimeter^2 throughout the length of the indwelling stent. There was minimal blood loss due to aspiration. The laser atherectomy catheter was then removed and a Navicross catheter and Glidewire were used to successfully cannulate the native SMA without difficulty. The Glidewire was exchanged for a 0.014 inch, exchange length Command wire which was positioned in the distal jejunal branch. The catheter was removed and angioplasty was performed of the distal indwelling stent and native proximal SMA with a 4 mm by 3 mm sterling balloon. Additional laser atherectomy with aspiration was performed about the distal aspect of the indwelling stent and the proximal, unstented SMA. SMA angiogram was then performed which demonstrated significantly improved patency and flow with persistent significant stenoses throughout the indwelling stent and heavy calcium burden of the proximal, un stented SMA. The 0.014 inch wire was then exchanged for a Wholey wire and VBX stents were placed in the proximal SMA, 7 mm x 39 mm  distally followed by 7 mm x 29 mm proximally. The distal portion was balloon mold with a 7 mm x 4 cm mustang balloon and the proximal portion was flared into the aorta with a 9 mm x 4 cm mustang balloon. Completion mesenteric angiography was then performed in both the lateral and AP projections which demonstrated widely patent newly re-lined SMA stents as well as widely patent distal main SMA and its branches. The indwelling catheters and sheath were removed and exchanged for a 6 French Angio-Seal closure device which was successfully deployed. Hemostasis was achieved immediately. Distal pulses were unchanged. Sterile bandage was applied. The patient tolerated procedure well and was transferred to the floor in stable condition postprocedurally. IMPRESSION: 1. Indwelling celiac and SMA stents remain occluded. 2. Technically successful laser atherectomy with aspiration of the  indwelling SMA stent followed by recanalization and covered, balloon expandable stent placement. Widely patent superior mesenteric artery upon completion. PLAN: Loading dose (300 mg) of clopidogrel today followed by 75 mg daily for 1 month, continue daily 81 mg aspirin as well. Follow-up in IR clinic in 1 month with repeat CTA abdomen and pelvis. Ruthann Cancer, MD [facility] Radiology Electronically Signed   By: Ruthann Cancer M.D.   On: 09/15/2020 14:09   IR THROMBECT PRIM MECH INIT (INCLU) MOD SED  Result Date: 09/15/2020 INDICATION: 65 year old male with history of chronic mesenteric ischemia with occluded indwelling celiac and superior mesenteric artery stents. EXAM: 1. Ultrasound-guided vascular access of the right common femoral artery. 2. Abdominal aortogram. 3. Selective catheterization and angiography of the superior mesenteric artery. 4. Laser atherectomy and aspiration thrombectomy of the superior mesenteric artery. 5. Balloon angioplasty of the superior mesenteric artery. 6. Superior mesenteric artery stent placement. MEDICATIONS: Ancef 2 gm IV. The antibiotic was administered within 1 hour of the procedure. 11000 units heparin, intravenous. ANESTHESIA/SEDATION: Moderate (conscious) sedation was employed during this procedure. A total of Versed 3.5 mg and Fentanyl 175 mcg was administered intravenously. Moderate Sedation Time: 114 minutes. The patient's level of consciousness and vital signs were monitored continuously by radiology nursing throughout the procedure under my direct supervision. CONTRAST:  69m OMNIPAQUE IOHEXOL 240 MG/ML SOLN, 567mOMNIPAQUE IOHEXOL 240 MG/ML SOLN, 5042mMNIPAQUE IOHEXOL 240 MG/ML SOLN FLUOROSCOPY TIME:  Fluoroscopy Time: 27 minutes 6 seconds (3,450 mGy). COMPLICATIONS: None immediate. PROCEDURE: Informed consent was obtained from the patient following explanation of the procedure, risks, benefits and alternatives. The  patient understands, agrees and consents for the procedure. All questions were addressed. A time out was performed prior to the initiation of the procedure. Maximal barrier sterile technique utilized including caps, mask, sterile gowns, sterile gloves, large sterile drape, hand hygiene, and Betadine prep. The right groin is prepped and draped in standard fashion. Preprocedure ultrasound evaluation demonstrated patent right common femoral artery scattered atherosclerotic calcifications. Procedure was planned. Subdermal Local anesthesia was provided with 1% lidocaine. A small skin nick was made. Under direct ultrasound visualization, a 21 gauge micropuncture needle was directed into the right common femoral artery. A permanent image was captured and stored. The needle was exchanged for the micropuncture sheath through which a limited right lower extremity angiogram was performed which demonstrated adequate puncture site for arterial closure device use. A J wire was inserted and directed to the abdominal aorta. The micropuncture sheath was removed and exchanged for a 5 French, 11 cm vascular sheath. A baseline ACT was obtained. Heparin was administered in boluses throughout the case to obtain adequate anticoagulation. An Omni  flush catheter was directed to the level of the celiac trunk. Abdominal angiography was performed which demonstrated complete occlusion of the indwelling celiac and SMA stents. The dual right and single left renal arteries are patent. The IMA is widely patent with prominent arc of Riolan collateralization. The image intensifier was placed in the lateral position and limited abdominal angiogram was performed which again demonstrated complete occlusion of the indwelling celiac and SMA stents. The Omni flush and indwelling 5 French sheath were then removed and exchanged for a 7 [place], 55 cm Tourguide steerable sheath. The sheath was directed to the SMA ostium which was proximally cannulated with a  stiff Glidewire and Navicross catheter. There was total occlusion of the midportion of the stent which was unable to be recannulated with catheter and wire techniques. Therefore, the indwelling catheter and wire removed while the steerable sheath remain seated in the proximal SMA stent. In a bare back fashion, the 2.0 Auryon laser atherectomy catheter was then advanced to the midportion of the SMA stent and laser atherectomy with combined suction aspiration was performed at 50 millijoules/millimeter^2 throughout the length of the indwelling stent. There was minimal blood loss due to aspiration. The laser atherectomy catheter was then removed and a Navicross catheter and Glidewire were used to successfully cannulate the native SMA without difficulty. The Glidewire was exchanged for a 0.014 inch, exchange length Command wire which was positioned in the distal jejunal branch. The catheter was removed and angioplasty was performed of the distal indwelling stent and native proximal SMA with a 4 mm by 3 mm sterling balloon. Additional laser atherectomy with aspiration was performed about the distal aspect of the indwelling stent and the proximal, unstented SMA. SMA angiogram was then performed which demonstrated significantly improved patency and flow with persistent significant stenoses throughout the indwelling stent and heavy calcium burden of the proximal, un stented SMA. The 0.014 inch wire was then exchanged for a Wholey wire and VBX stents were placed in the proximal SMA, 7 mm x 39 mm distally followed by 7 mm x 29 mm proximally. The distal portion was balloon mold with a 7 mm x 4 cm mustang balloon and the proximal portion was flared into the aorta with a 9 mm x 4 cm mustang balloon. Completion mesenteric angiography was then performed in both the lateral and AP projections which demonstrated widely patent newly re-lined SMA stents as well as widely patent distal main SMA and its branches. The indwelling catheters  and sheath were removed and exchanged for a 6 French Angio-Seal closure device which was successfully deployed. Hemostasis was achieved immediately. Distal pulses were unchanged. Sterile bandage was applied. The patient tolerated procedure well and was transferred to the floor in stable condition postprocedurally. IMPRESSION: 1. Indwelling celiac and SMA stents remain occluded. 2. Technically successful laser atherectomy with aspiration of the indwelling SMA stent followed by recanalization and covered, balloon expandable stent placement. Widely patent superior mesenteric artery upon completion. PLAN: Loading dose (300 mg) of clopidogrel today followed by 75 mg daily for 1 month, continue daily 81 mg aspirin as well. Follow-up in IR clinic in 1 month with repeat CTA abdomen and pelvis. Ruthann Cancer, MD [facility] Radiology Electronically Signed   By: Ruthann Cancer M.D.   On: 09/15/2020 14:09   IR US Guide Vasc Access Right  Result Date: 09/15/2020 INDICATION: 65 year old male with history of chronic mesenteric ischemia with occluded indwelling celiac and superior mesenteric artery stents. EXAM: 1. Ultrasound-guided vascular  access of the right common femoral artery. 2. Abdominal aortogram. 3. Selective catheterization and angiography of the superior mesenteric artery. 4. Laser atherectomy and aspiration thrombectomy of the superior mesenteric artery. 5. Balloon angioplasty of the superior mesenteric artery. 6. Superior mesenteric artery stent placement. MEDICATIONS: Ancef 2 gm IV. The antibiotic was administered within 1 hour of the procedure. 11000 units heparin, intravenous. ANESTHESIA/SEDATION: Moderate (conscious) sedation was employed during this procedure. A total of Versed 3.5 mg and Fentanyl 175 mcg was administered intravenously. Moderate Sedation Time: 114 minutes. The patient's level of consciousness and vital signs were monitored continuously by  radiology nursing throughout the procedure under my direct supervision. CONTRAST:  42m OMNIPAQUE IOHEXOL 240 MG/ML SOLN, 548mOMNIPAQUE IOHEXOL 240 MG/ML SOLN, 5057mMNIPAQUE IOHEXOL 240 MG/ML SOLN FLUOROSCOPY TIME:  Fluoroscopy Time: 27 minutes 6 seconds (3,450 mGy). COMPLICATIONS: None immediate. PROCEDURE: Informed consent was obtained from the patient following explanation of the procedure, risks, benefits and alternatives. The patient understands, agrees and consents for the procedure. All questions were addressed. A time out was performed prior to the initiation of the procedure. Maximal barrier sterile technique utilized including caps, mask, sterile gowns, sterile gloves, large sterile drape, hand hygiene, and Betadine prep. The right groin is prepped and draped in standard fashion. Preprocedure ultrasound evaluation demonstrated patent right common femoral artery scattered atherosclerotic calcifications. Procedure was planned. Subdermal Local anesthesia was provided with 1% lidocaine. A small skin nick was made. Under direct ultrasound visualization, a 21 gauge micropuncture needle was directed into the right common femoral artery. A permanent image was captured and stored. The needle was exchanged for the micropuncture sheath through which a limited right lower extremity angiogram was performed which demonstrated adequate puncture site for arterial closure device use. A J wire was inserted and directed to the abdominal aorta. The micropuncture sheath was removed and exchanged for a 5 French, 11 cm vascular sheath. A baseline ACT was obtained. Heparin was administered in boluses throughout the case to obtain adequate anticoagulation. An Omni flush catheter was directed to the level of the celiac trunk. Abdominal angiography was performed which demonstrated complete occlusion of the indwelling celiac and SMA stents. The dual right and single left renal arteries are patent. The IMA is widely patent with  prominent arc of Riolan collateralization. The image intensifier was placed in the lateral position and limited abdominal angiogram was performed which again demonstrated complete occlusion of the indwelling celiac and SMA stents. The Omni flush and indwelling 5 French sheath were then removed and exchanged for a 7 [place] cm Tourguide steerable sheath. The sheath was directed to the SMA ostium which was proximally cannulated with a stiff Glidewire and Navicross catheter. There was total occlusion of the midportion of the stent which was unable to be recannulated with catheter and wire techniques. Therefore, the indwelling catheter and wire removed while the steerable sheath remain seated in the proximal SMA stent. In a bare back fashion, the 2.0 Auryon laser atherectomy catheter was then advanced to the midportion of the SMA stent and laser atherectomy with combined suction aspiration was performed at 50 millijoules/millimeter^2 throughout the length of the indwelling stent. There was minimal blood loss due to aspiration. The laser atherectomy catheter was then removed and a Navicross catheter and Glidewire were used to successfully cannulate the native SMA without difficulty. The Glidewire was exchanged for a 0.014 inch, exchange length Command wire which was positioned in the distal jejunal branch. The catheter was removed and angioplasty was performed of  the distal indwelling stent and native proximal SMA with a 4 mm by 3 mm sterling balloon. Additional laser atherectomy with aspiration was performed about the distal aspect of the indwelling stent and the proximal, unstented SMA. SMA angiogram was then performed which demonstrated significantly improved patency and flow with persistent significant stenoses throughout the indwelling stent and heavy calcium burden of the proximal, un stented SMA. The 0.014 inch wire was then exchanged for a Wholey wire and VBX stents were placed in the proximal SMA, 7 mm x 39  mm distally followed by 7 mm x 29 mm proximally. The distal portion was balloon mold with a 7 mm x 4 cm mustang balloon and the proximal portion was flared into the aorta with a 9 mm x 4 cm mustang balloon. Completion mesenteric angiography was then performed in both the lateral and AP projections which demonstrated widely patent newly re-lined SMA stents as well as widely patent distal main SMA and its branches. The indwelling catheters and sheath were removed and exchanged for a 6 French Angio-Seal closure device which was successfully deployed. Hemostasis was achieved immediately. Distal pulses were unchanged. Sterile bandage was applied. The patient tolerated procedure well and was transferred to the floor in stable condition postprocedurally. IMPRESSION: 1. Indwelling celiac and SMA stents remain occluded. 2. Technically successful laser atherectomy with aspiration of the indwelling SMA stent followed by recanalization and covered, balloon expandable stent placement. Widely patent superior mesenteric artery upon completion. PLAN: Loading dose (300 mg) of clopidogrel today followed by 75 mg daily for 1 month, continue daily 81 mg aspirin as well. Follow-up in IR clinic in 1 month with repeat CTA abdomen and pelvis. Ruthann Cancer, MD [facility] Radiology Electronically Signed   By: Ruthann Cancer M.D.   On: 09/15/2020 14:09   DG Chest Port 1 View  Result Date: 09/10/2020 CLINICAL DATA:  Preop EXAM: PORTABLE CHEST 1 VIEW COMPARISON:  None. FINDINGS: The heart is enlarged. The mediastinal contours are within normal limits for there is calcified atherosclerotic plaque of the aortic arch. There is no focal consolidation or pulmonary edema. There is no pleural effusion or pneumothorax. The bones are unremarkable. IMPRESSION: Cardiomegaly. Otherwise, no radiographic evidence of acute cardiopulmonary process. Electronically Signed   By: Valetta Mole M.D.   On:  09/10/2020 14:46   IR TRANSCATH PLC STENT 1ST ART NOT LE CV CAR VERT CAR  Result Date: 09/15/2020 INDICATION: 65 year old male with history of chronic mesenteric ischemia with occluded indwelling celiac and superior mesenteric artery stents. EXAM: 1. Ultrasound-guided vascular access of the right common femoral artery. 2. Abdominal aortogram. 3. Selective catheterization and angiography of the superior mesenteric artery. 4. Laser atherectomy and aspiration thrombectomy of the superior mesenteric artery. 5. Balloon angioplasty of the superior mesenteric artery. 6. Superior mesenteric artery stent placement. MEDICATIONS: Ancef 2 gm IV. The antibiotic was administered within 1 hour of the procedure. 11000 units heparin, intravenous. ANESTHESIA/SEDATION: Moderate (conscious) sedation was employed during this procedure. A total of Versed 3.5 mg and Fentanyl 175 mcg was administered intravenously. Moderate Sedation Time: 114 minutes. The patient's level of consciousness and vital signs were monitored continuously by radiology nursing throughout the procedure under my direct supervision. CONTRAST:  86m OMNIPAQUE IOHEXOL 240 MG/ML SOLN, 535mOMNIPAQUE IOHEXOL 240 MG/ML SOLN, 5062mMNIPAQUE IOHEXOL 240 MG/ML SOLN FLUOROSCOPY TIME:  Fluoroscopy Time: 27 minutes 6 seconds (3,450 mGy). COMPLICATIONS: None immediate. PROCEDURE: Informed consent was obtained from the patient following explanation of the procedure, risks,  benefits and alternatives. The patient understands, agrees and consents for the procedure. All questions were addressed. A time out was performed prior to the initiation of the procedure. Maximal barrier sterile technique utilized including caps, mask, sterile gowns, sterile gloves, large sterile drape, hand hygiene, and Betadine prep. The right groin is prepped and draped in standard fashion. Preprocedure ultrasound evaluation demonstrated patent right common femoral artery scattered atherosclerotic  calcifications. Procedure was planned. Subdermal Local anesthesia was provided with 1% lidocaine. A small skin nick was made. Under direct ultrasound visualization, a 21 gauge micropuncture needle was directed into the right common femoral artery. A permanent image was captured and stored. The needle was exchanged for the micropuncture sheath through which a limited right lower extremity angiogram was performed which demonstrated adequate puncture site for arterial closure device use. A J wire was inserted and directed to the abdominal aorta. The micropuncture sheath was removed and exchanged for a 5 French, 11 cm vascular sheath. A baseline ACT was obtained. Heparin was administered in boluses throughout the case to obtain adequate anticoagulation. An Omni flush catheter was directed to the level of the celiac trunk. Abdominal angiography was performed which demonstrated complete occlusion of the indwelling celiac and SMA stents. The dual right and single left renal arteries are patent. The IMA is widely patent with prominent arc of Riolan collateralization. The image intensifier was placed in the lateral position and limited abdominal angiogram was performed which again demonstrated complete occlusion of the indwelling celiac and SMA stents. The Omni flush and indwelling 5 French sheath were then removed and exchanged for a 7 [place], 55 cm Tourguide steerable sheath. The sheath was directed to the SMA ostium which was proximally cannulated with a stiff Glidewire and Navicross catheter. There was total occlusion of the midportion of the stent which was unable to be recannulated with catheter and wire techniques. Therefore, the indwelling catheter and wire removed while the steerable sheath remain seated in the proximal SMA stent. In a bare back fashion, the 2.0 Auryon laser atherectomy catheter was then advanced to the midportion of the SMA stent and laser atherectomy with combined suction aspiration was performed  at 50 millijoules/millimeter^2 throughout the length of the indwelling stent. There was minimal blood loss due to aspiration. The laser atherectomy catheter was then removed and a Navicross catheter and Glidewire were used to successfully cannulate the native SMA without difficulty. The Glidewire was exchanged for a 0.014 inch, exchange length Command wire which was positioned in the distal jejunal branch. The catheter was removed and angioplasty was performed of the distal indwelling stent and native proximal SMA with a 4 mm by 3 mm sterling balloon. Additional laser atherectomy with aspiration was performed about the distal aspect of the indwelling stent and the proximal, unstented SMA. SMA angiogram was then performed which demonstrated significantly improved patency and flow with persistent significant stenoses throughout the indwelling stent and heavy calcium burden of the proximal, un stented SMA. The 0.014 inch wire was then exchanged for a Wholey wire and VBX stents were placed in the proximal SMA, 7 mm x 39 mm distally followed by 7 mm x 29 mm proximally. The distal portion was balloon mold with a 7 mm x 4 cm mustang balloon and the proximal portion was flared into the aorta with a 9 mm x 4 cm mustang balloon. Completion mesenteric angiography was then performed in both the lateral and AP projections which demonstrated widely patent newly re-lined SMA stents as well as widely patent  distal main SMA and its branches. The indwelling catheters and sheath were removed and exchanged for a 6 French Angio-Seal closure device which was successfully deployed. Hemostasis was achieved immediately. Distal pulses were unchanged. Sterile bandage was applied. The patient tolerated procedure well and was transferred to the floor in stable condition postprocedurally. IMPRESSION: 1. Indwelling celiac and SMA stents remain occluded. 2. Technically successful laser atherectomy with aspiration of the indwelling SMA stent  followed by recanalization and covered, balloon expandable stent placement. Widely patent superior mesenteric artery upon completion. PLAN: Loading dose (300 mg) of clopidogrel today followed by 75 mg daily for 1 month, continue daily 81 mg aspirin as well. Follow-up in IR clinic in 1 month with repeat CTA abdomen and pelvis. Ruthann Cancer, MD [facility] Radiology Electronically Signed   By: Ruthann Cancer M.D.   On: 09/15/2020 14:09   IR Radiologist Eval & Mgmt  Result Date: 08/28/2020 Please refer to notes tab for details about interventional procedure. (Op Note)  CT Angio Abd/Pel w/ and/or w/o  Result Date: 09/08/2020 CLINICAL DATA:  Chronic mesenteric ischemia, left-sided abdominal pain EXAM: CTA ABDOMEN AND PELVIS WITH CONTRAST TECHNIQUE: Multidetector CT imaging of the abdomen and pelvis was performed using the standard protocol during bolus administration of intravenous contrast. Multiplanar reconstructed images and MIPs were obtained and reviewed to evaluate the vascular anatomy. CONTRAST:  132m OMNIPAQUE IOHEXOL 300 MG/ML  SOLN COMPARISON:  07/29/2020 FINDINGS: VASCULAR Aorta: Moderate calcified plaque. No aneurysm, dissection, or stenosis. Celiac: Occluded stent across the origin, protrudes approximately 1 cm into the lumen of the aorta. Collateral reconstitution of the trifurcation vessels. SMA: Occluded stent across the origin, protrudes approximately 0.5 cm into the lumen of the aorta. There is angulation of the native vessel just distal to the stent with short-segment occlusion. Collateral reconstitution of the distal SMA which is moderately atheromatous. Renals: Single left, with calcified ostial plaque, no significant stenosis. Duplicated right, superior dominant, both with proximal short-segment stenoses of at least mild hemodynamic significance, patent distally. IMA: Mild short-segment origin stenosis related to aortic wall plaque, dilated  distally with prominent arc of Riolan collateral supply to SMA distribution. Inflow: Moderate calcified plaque, ectatic. No dissection or stenosis. Proximal Outflow: Ectatic, atheromatous, patent. Veins: Tapered narrowing of the splenic vein to the level of the pancreatic tail. Patent IMV, SMV, and portal vein. Patent bilateral renal veins. Review of the MIP images confirms the above findings. NON-VASCULAR Lower chest: No pleural or pericardial effusion. Coronary calcifications. Calcified granuloma in the posterior basal segment right lower lobe. Hepatobiliary: No focal liver abnormality is seen. No gallstones, gallbladder wall thickening, or biliary dilatation. Pancreas: Scattered coarse calcifications in the pancreatic body and head. No discrete mass or ductal dilatation. Spleen: Normal in size.  Small calcified granulomas. Adrenals/Urinary Tract: Adrenal glands unremarkable. Stable bilateral renal cysts. No hydronephrosis. Urinary bladder incompletely distended. Stomach/Bowel: Stomach is decompressed. Small bowel is nondistended. 3.5 cm poorly marginated phlegmonous process adjacent to the appendix, with mild regional inflammatory/edematous change, new since previous. No discrete abscess. The colon is nondilated, unremarkable. Lymphatic: No abdominal or pelvic adenopathy. Reproductive: Mild prostate enlargement. Other: Bilateral pelvic phleboliths.  No ascites.  No free air. Musculoskeletal: Mild multilevel lumbar spondylitic change. No fracture or worrisome bone lesion. IMPRESSION: 1. Periappendiceal 3.5 cm phlegmonous process with surrounding inflammatory change, new since 07/29/2020, suggesting smoldering appendicitis. No abscess. 2. Occluded celiac and superior mesenteric artery ostial stents, with collateral arterial supply from patent inferior mesenteric artery. 3. Coronary and  Aortic Atherosclerosis (ICD10-170.0). Electronically Signed   By: Lucrezia Europe M.D.   On: 09/08/2020 16:41    Labs:  CBC: Recent  Labs    09/10/20 1324 09/11/20 0336 09/15/20 0630 09/15/20 1847  WBC 6.8 6.3 7.7 8.4  HGB 8.8* 8.4* 9.2* 9.8*  HCT 28.8* 27.1* 30.6* 31.7*  PLT 258 231 306 260    COAGS: Recent Labs    09/15/20 0630  INR 1.0    BMP: Recent Labs    09/08/20 1541 09/10/20 1324 09/11/20 0336 09/15/20 0630  NA  --  137 138 141  K  --  3.2* 3.5 3.5  CL  --  101 102 107  CO2  --  _0 GLUCOSE  --  87 81 102*  BUN  --  _1 CALCIUM  --  8.8* 9.0 9.0  CREATININE 0.70 0.58* 0.60* 0.66  GFRNONAA  --  >60 >60 >60    LIVER FUNCTION TESTS: Recent Labs    09/10/20 1324 09/15/20 0630  BILITOT 0.6 0.5  AST 12* 17  ALT 10 13  ALKPHOS 175* 205*  PROT 6.5 7.0  ALBUMIN 2.9* 3.0*    TUMOR MARKERS: No results for input(s): AFPTM, CEA, CA199, CHROMGRNA in the last 8760 hours.  Assessment and Plan:  65 yo male with known chronic mesenteric ischemia, SP revascularization of the SMA stent today.   He is complaining of new abdominal pain this evening of a few hours duration.    This might be related to reperfusion of the bowel, with previous ischemic state, however, new SMA stent occlusion with acute mesenteric ischemia is also possible, with "pain out of proportion to exam."   He also is admitted with known appendicitis, prior recent pancreatitis, and SMA visceral pseudoaneurysm.    Given the possibility of AMI, I have initiated empiric heparin therapy, with some diagnostic labs.  We will plan on CTA now to rule out new stent thrombosis or other acute abdominal pathology.    I have updated him and his family on the plan.   Plan: - NPO now, until CT is cleared - CTA abdomen pelvis to evaluate for acute mesenteric ischemia/stent thrombosis - follow up on CBC/diff, and lactic acid - initiate heparin therapy, given the possibility of AMI - fluid resuscitation/bolus - repeat dose of broad spectrum ABX/zosyn - if CT is cleared, will consider clear diet  Electronically  Signed: Corrie Mckusick, DO 09/15/2020, 7:55 PM   I spent a total of    25 Minutes in face to face in clinical consultation, greater than 50% of which was counseling/coordinating care for abdominal pain after SMA revascularization

## 2020-09-16 DIAGNOSIS — K55059 Acute (reversible) ischemia of intestine, part and extent unspecified: Secondary | ICD-10-CM | POA: Diagnosis not present

## 2020-09-16 LAB — CBC
HCT: 26.7 % — ABNORMAL LOW (ref 39.0–52.0)
Hemoglobin: 8.3 g/dL — ABNORMAL LOW (ref 13.0–17.0)
MCH: 28.3 pg (ref 26.0–34.0)
MCHC: 31.1 g/dL (ref 30.0–36.0)
MCV: 91.1 fL (ref 80.0–100.0)
Platelets: 238 10*3/uL (ref 150–400)
RBC: 2.93 MIL/uL — ABNORMAL LOW (ref 4.22–5.81)
RDW: 15.4 % (ref 11.5–15.5)
WBC: 6.8 10*3/uL (ref 4.0–10.5)
nRBC: 0 % (ref 0.0–0.2)

## 2020-09-16 LAB — HEPARIN LEVEL (UNFRACTIONATED): Heparin Unfractionated: <0.1 IU/mL — ABNORMAL LOW (ref 0.30–0.70)

## 2020-09-16 LAB — BASIC METABOLIC PANEL
Anion gap: 6 (ref 5–15)
BUN: 8 mg/dL (ref 8–23)
CO2: 25 mmol/L (ref 22–32)
Calcium: 8.4 mg/dL — ABNORMAL LOW (ref 8.9–10.3)
Chloride: 107 mmol/L (ref 98–111)
Creatinine, Ser: 0.61 mg/dL (ref 0.61–1.24)
GFR, Estimated: >60 mL/min (ref >60)
Glucose, Bld: 91 mg/dL (ref 70–99)
Potassium: 3.4 mmol/L — ABNORMAL LOW (ref 3.5–5.1)
Sodium: 138 mmol/L (ref 135–145)

## 2020-09-16 LAB — LIPASE, BLOOD: Lipase: 17 U/L (ref 11–51)

## 2020-09-16 LAB — LACTIC ACID, PLASMA: Lactic Acid, Venous: 0.7 mmol/L (ref 0.5–1.9)

## 2020-09-16 MED ORDER — CLOPIDOGREL BISULFATE 75 MG PO TABS: 75.0000 mg | ORAL_TABLET | Freq: Every day | ORAL | 0 refills | Status: AC

## 2020-09-16 NOTE — Progress Notes (Signed)
Chicken for heparin while oral anticoagulation is on hold Indication: atrial fibrillation  No Known Allergies  Patient Measurements: Height: '6\' 1"'$  (185.4 cm) Weight: 104.3 kg (230 lb) IBW/kg (Calculated) : 79.9 Heparin Dosing Weight: 100kg  Vital Signs: Temp: 97.7 F (36.5 C) (08/23 0000) Temp Source: Oral (08/23 0000) BP: 115/72 (08/22 2037) Pulse Rate: 64 (08/22 2037)  Labs: Recent Labs    09/15/20 0630 09/15/20 1847 09/16/20 0350  HGB 9.2* 9.8* 8.3*  HCT 30.6* 31.7* 26.7*  PLT 306 260 238  LABPROT 13.5  --   --   INR 1.0  --   --   HEPARINUNFRC  --   --  <0.10*  CREATININE 0.66  --   --     Assessment: 65 year old male on oral anticoagulation for afib previously. This has been on hold for procedures. Patient admitted for overnight observation post mesenteric stent placement by IR this morning. Orders to start IV heparin tonight and possible discharge tomorrow.   Heparin level this am <0.1 units/ml.  No issues noted with infusion  Goal of Therapy:  Heparin level 0.3-0.7 units/ml Monitor platelets by anticoagulation protocol: Yes   Plan:  Increase heparin drip to 1600 units/hr F/u plan for [facility]  Excell Seltzer, PharmD Clinical Pharmacist 09/16/2020 4:40 AM

## 2020-09-16 NOTE — Discharge Summary (Signed)
Patient ID: Benjamin Fox MRN: [id number] DOB/AGE: [birth year] 65 y.o.  Admit date: 09/15/2020 Discharge date: 09/16/2020  Supervising Physician: Jacqulynn Cadet  Patient Status: [facility] - In-pt  Admission Diagnoses: Mesenteric ischemia with prior celiac and SMA stent placements 04/01/20 at [facility]  Discharge Diagnoses:  Active Problems:   Superior mesenteric artery stenosis ([place])   Mesenteric ischemia [facility])  Discharged Condition: good  Hospital Course:  Patient with a history of chronic mesenteric ischemia status post celiac and SMA stent placements March 2022 at [facility]. The stents thrombosed shortly thereafter with the additional developments of acute pancreatitis with walled-off necrosis and a 1 cm peripancreatic pseudoaneurysm. [facility] IR's attempts at stent recanalization as well as accessing the pseudoaneurysm were unsuccessful and the patient consulted with Dr. Serafina Royals for further treatment. A repeat CTA showed resolution of the pseudoaneurysm and the patient presented to IR 09/15/20 for a mesenteric angiogram with SMA stent recanalization and SMA stent placement via right CFA access. Of note, the patient was recently hospitalized and discharged 09/12/20 for acute appendicitis. He was discharged home on oral antibiotics with plans for outpatient surgical follow up.   Following IR procedure, he was admitted for overnight observation and he developed severe abdominal pain around 7 pm last night. The patient was evaluated by Dr. Earleen Newport with differential diagnoses of reperfusion pain versus new SMA stent occlusion with acute mesenteric ischemia. A heparin infusion was initiated, oral narcotics administered and CTA obtained. CTA demonstrated patent SMA stent and SMA branches; chronic occlusion of celiac artery stent; increased inflammatory changes in the right lower quadrant compatible with appendicitis and developing periappendiceal abscess.   This morning, the patient states his pain  has completely resolved. He denies any abdominal pain or discomfort. He is afebrile and his labs are all within normal range. The results of the CTA were discussed with the patient and he is aware of the continued findings of appendicitis. He was eager to go home and given his clinical stability he was discharged home.  He remains on oral antibiotics (Augmentin) from his recent hospital admission for acute appendicitis. He has a follow up appointment with Dr. Rosendo Gros [facility]) on 10/03/20. [name], PA-C with [facility] was notified via Epic chat about CT findings and she stated she will relay this information to the [facility] team.   The patient was discharged home with a 90-day supply of Plavix (75 mg once daily) e-prescribed to his pharmacy. He knows he needs to take Plavix and aspirin daily. He will follow up with Dr. Serafina Royals in one month for a clinic visit and repeat CTA abdomen pelvis.  Benjamin Fox knows he needs to call his doctor or come to the ED if he develops abdominal pain, nausea, vomiting, diarrhea or any other signs/symptoms of worsening appendicitis or mesenteric ischemia. He was instructed to minimize bending/stooping or heavy lifting greater than 10 lbs for approximately one week while his right femoral artery vascular access site heals. He was instructed to remove the dressing this afternoon after he returns home.  He knows he can call our clinic with any questions/concerns.   Consults: Pharmacy for Heparin infusion   Significant Diagnostic Studies: IR Aortagram Abdominal Serialogram IR THROMBECT PRIM MECH INIT (INCLU) MOD SED IR US Guide Vasc Access Right IR TRANSCATH PLC STENT 1ST ART NOT LE CV CAR VERT CAR  Result Date: 09/15/2020 INDICATION: 65 year old male with history of chronic mesenteric ischemia with occluded indwelling celiac and superior mesenteric artery stents. EXAM: 1. Ultrasound-guided vascular access  of the right common femoral artery. 2. Abdominal  aortogram. 3. Selective catheterization and angiography of the superior mesenteric artery. 4. Laser atherectomy and aspiration thrombectomy of the superior mesenteric artery. 5. Balloon angioplasty of the superior mesenteric artery. 6. Superior mesenteric artery stent placement. MEDICATIONS: Ancef 2 gm IV. The antibiotic was administered within 1 hour of the procedure. 11000 units heparin, intravenous. ANESTHESIA/SEDATION: Moderate (conscious) sedation was employed during this procedure. A total of Versed 3.5 mg and Fentanyl 175 mcg was administered intravenously. Moderate Sedation Time: 114 minutes. The patient's level of consciousness and vital signs were monitored continuously by radiology nursing throughout the procedure under my direct supervision. CONTRAST:  39m OMNIPAQUE IOHEXOL 240 MG/ML SOLN, 570mOMNIPAQUE IOHEXOL 240 MG/ML SOLN, 5072mMNIPAQUE IOHEXOL 240 MG/ML SOLN FLUOROSCOPY TIME:  Fluoroscopy Time: 27 minutes 6 seconds (3,450 mGy). COMPLICATIONS: None immediate. PROCEDURE: Informed consent was obtained from the patient following explanation of the procedure, risks, benefits and alternatives. The patient understands, agrees and consents for the procedure. All questions were addressed. A time out was performed prior to the initiation of the procedure. Maximal barrier sterile technique utilized including caps, mask, sterile gowns, sterile gloves, large sterile drape, hand hygiene, and Betadine prep. The right groin is prepped and draped in standard fashion. Preprocedure ultrasound evaluation demonstrated patent right common femoral artery scattered atherosclerotic calcifications. Procedure was planned. Subdermal Local anesthesia was provided with 1% lidocaine. A small skin nick was made. Under direct ultrasound visualization, a 21 gauge micropuncture needle was directed into the right common femoral artery. A permanent image was captured and stored. The needle was exchanged for the micropuncture sheath  through which a limited right lower extremity angiogram was performed which demonstrated adequate puncture site for arterial closure device use. A J wire was inserted and directed to the abdominal aorta. The micropuncture sheath was removed and exchanged for a 5 French, 11 cm vascular sheath. A baseline ACT was obtained. Heparin was administered in boluses throughout the case to obtain adequate anticoagulation. An Omni flush catheter was directed to the level of the celiac trunk. Abdominal angiography was performed which demonstrated complete occlusion of the indwelling celiac and SMA stents. The dual right and single left renal arteries are patent. The IMA is widely patent with prominent arc of Riolan collateralization. The image intensifier was placed in the lateral position and limited abdominal angiogram was performed which again demonstrated complete occlusion of the indwelling celiac and SMA stents. The Omni flush and indwelling 5 French sheath were then removed and exchanged for a 7 [place] cm Tourguide steerable sheath. The sheath was directed to the SMA ostium which was proximally cannulated with a stiff Glidewire and Navicross catheter. There was total occlusion of the midportion of the stent which was unable to be recannulated with catheter and wire techniques. Therefore, the indwelling catheter and wire removed while the steerable sheath remain seated in the proximal SMA stent. In a bare back fashion, the 2.0 Auryon laser atherectomy catheter was then advanced to the midportion of the SMA stent and laser atherectomy with combined suction aspiration was performed at 50 millijoules/millimeter^2 throughout the length of the indwelling stent. There was minimal blood loss due to aspiration. The laser atherectomy catheter was then removed and a Navicross catheter and Glidewire were used to successfully cannulate the native SMA without difficulty. The Glidewire was exchanged for a 0.014 inch, exchange length  Command wire which was positioned in the distal jejunal branch. The catheter was removed and angioplasty was performed of  the distal indwelling stent and native proximal SMA with a 4 mm by 3 mm sterling balloon. Additional laser atherectomy with aspiration was performed about the distal aspect of the indwelling stent and the proximal, unstented SMA. SMA angiogram was then performed which demonstrated significantly improved patency and flow with persistent significant stenoses throughout the indwelling stent and heavy calcium burden of the proximal, un stented SMA. The 0.014 inch wire was then exchanged for a Wholey wire and VBX stents were placed in the proximal SMA, 7 mm x 39 mm distally followed by 7 mm x 29 mm proximally. The distal portion was balloon mold with a 7 mm x 4 cm mustang balloon and the proximal portion was flared into the aorta with a 9 mm x 4 cm mustang balloon. Completion mesenteric angiography was then performed in both the lateral and AP projections which demonstrated widely patent newly re-lined SMA stents as well as widely patent distal main SMA and its branches. The indwelling catheters and sheath were removed and exchanged for a 6 French Angio-Seal closure device which was successfully deployed. Hemostasis was achieved immediately. Distal pulses were unchanged. Sterile bandage was applied. The patient tolerated procedure well and was transferred to the floor in stable condition postprocedurally. IMPRESSION: 1. Indwelling celiac and SMA stents remain occluded. 2. Technically successful laser atherectomy with aspiration of the indwelling SMA stent followed by recanalization and covered, balloon expandable stent placement. Widely patent superior mesenteric artery upon completion. PLAN: Loading dose (300 mg) of clopidogrel today followed by 75 mg daily for 1 month, continue daily 81 mg aspirin as well. Follow-up in IR clinic in 1 month with repeat CTA abdomen and pelvis. Ruthann Cancer, MD  [facility] Electronically Signed   By: Ruthann Cancer M.D.   On: 09/15/2020 14:09   DG Chest Port 1 View  Result Date: 09/10/2020 CLINICAL DATA:  Preop EXAM: PORTABLE CHEST 1 VIEW COMPARISON:  None. FINDINGS: The heart is enlarged. The mediastinal contours are within normal limits for there is calcified atherosclerotic plaque of the aortic arch. There is no focal consolidation or pulmonary edema. There is no pleural effusion or pneumothorax. The bones are unremarkable. IMPRESSION: Cardiomegaly. Otherwise, no radiographic evidence of acute cardiopulmonary process. Electronically Signed   By: Valetta Mole M.D.   On: 09/10/2020 14:46   CT Angio Abd/Pel w/ and/or w/o  Result Date: 09/16/2020 CLINICAL DATA:  Evaluate for acute mesenteric ischemia after SMA stent recanalization. EXAM: CTA ABDOMEN AND PELVIS WITHOUT AND WITH CONTRAST TECHNIQUE: Multidetector CT imaging of the abdomen and pelvis was performed using the standard protocol during bolus administration of intravenous contrast. Multiplanar reconstructed images and MIPs were obtained and reviewed to evaluate the vascular anatomy. CONTRAST:  158m OMNIPAQUE IOHEXOL 350 MG/ML SOLN COMPARISON:  09/09/2020 and 07/29/2020 FINDINGS: VASCULAR Aorta: Normal caliber of the abdominal aorta with atherosclerotic calcifications. Negative for stenosis or dissection. Celiac: There is a celiac artery stent and the distal aspect of stent remains occluded. There is flow in the celiac trunk beyond the occluded stent. The splenic artery is heavily calcified but patent. There is flow in the left gastric artery and common hepatic artery. SMA: There has been recanalization of the SMA stent and evidence for new stent placements. Main SMA branches are patent. There is no evidence for distal embolic disease. Renals: 2 right renal arteries. The main right renal artery has close to 50% stenosis at the origin due to calcified  plaque. Single left renal artery is widely  patent with a small amount of plaque near the origin. IMA: Again noted is a enlarged inferior mesenteric artery that is patent. Inflow: Bilateral iliac arteries are patent with atherosclerotic calcifications. Common, internal and external iliac arteries are patent without significant stenosis. Proximal Outflow: Proximal femoral arteries are patent bilaterally. Veins: Hepatic veins are patent. Main portal venous system is patent. Main mesenteric veins are patent. Normal appearance of the IVC and renal veins. Review of the MIP images confirms the above findings. NON-VASCULAR Lower chest: New trace right pleural fluid. Minimal dependent atelectasis at the left lung base. Hepatobiliary: New trace perihepatic ascites along the anterior liver on sequence 16, image 19. Mild distention of the gallbladder without inflammatory changes. No biliary dilatation. Pancreas: Again noted are small calcifications in the pancreatic head and uncinate process. No acute inflammatory changes involving the pancreas and no duct dilatation. Spleen: Normal in size without focal abnormality. Adrenals/Urinary Tract: Normal adrenal glands. Bilateral renal cysts without hydronephrosis. There is contrast in the urinary bladder. Stomach/Bowel: Increased inflammatory changes in the right lower quadrant near the distal ileum and appendix. There is an inflammatory collection in the right lower quadrant that measures 5.0 x 5.7 cm on sequence 16, image 70 and measured roughly 3.0 x 4.1 cm on 09/09/2020. The central aspect of this collection is slightly low density and suspect this is a developing periappendiceal abscess collection. Again noted are inflammatory changes surrounding this collection. Moderate amount of stool in the cecum and right colon. No evidence for bowel obstruction. Lymphatic: No significant lymph node enlargement in the abdomen or pelvis. Reproductive: Stable appearance of the prostate.  Other: New pelvic free fluid. New trace perihepatic fluid. Negative for free air. Musculoskeletal: No acute bone abnormality. Degenerative facet disease in lumbar spine. IMPRESSION: VASCULAR 1. Recanalized SMA stent is patent. Main SMA branches are patent. No evidence for distal embolic disease following SMA stent recanalization. 2. 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 Reconstitution of the celiac artery branches beyond the occluded stent. 3. Patent enlarged inferior mesenteric artery. 4. At least mild stenosis involving the main right renal artery. NON-VASCULAR 1. Increased inflammatory changes in the right lower quadrant centered on the appendix and distal ileum. Findings compatible with appendicitis and developing periappendiceal abscess. 2. Small amount of ascites. 3. Bilateral renal cysts. 4. New trace right pleural fluid. These results were called by telephone at the time of interpretation on 09/16/2020 at 8:46 am to provider Corrie Mckusick , who verbally acknowledged these results. Electronically Signed   By: Markus Daft M.D.   On: 09/16/2020 08:48   CT Angio Abd/Pel w/ and/or w/o  Result Date: 09/08/2020 CLINICAL DATA:  Chronic mesenteric ischemia, left-sided abdominal pain EXAM: CTA ABDOMEN AND PELVIS WITH CONTRAST TECHNIQUE: Multidetector CT imaging of the abdomen and pelvis was performed using the standard protocol during bolus administration of intravenous contrast. Multiplanar reconstructed images and MIPs were obtained and reviewed to evaluate the vascular anatomy. CONTRAST:  183m OMNIPAQUE IOHEXOL 300 MG/ML  SOLN COMPARISON:  07/29/2020 FINDINGS: VASCULAR Aorta: Moderate calcified plaque. No aneurysm, dissection, or stenosis. Celiac: Occluded stent across the origin, protrudes approximately 1 cm into the lumen of the aorta. Collateral reconstitution of the trifurcation vessels. SMA: Occluded stent across the origin, protrudes approximately 0.5 cm into the lumen of the aorta. There is  angulation of the native vessel just distal to the stent with short-segment occlusion. Collateral reconstitution of the distal SMA which is moderately atheromatous. Renals: Single left, with calcified ostial plaque, no significant stenosis. Duplicated  right, superior dominant, both with proximal short-segment stenoses of at least mild hemodynamic significance, patent distally. IMA: Mild short-segment origin stenosis related to aortic wall plaque, dilated distally with prominent arc of Riolan collateral supply to SMA distribution. Inflow: Moderate calcified plaque, ectatic. No dissection or stenosis. Proximal Outflow: Ectatic, atheromatous, patent. Veins: Tapered narrowing of the splenic vein to the level of the pancreatic tail. Patent IMV, SMV, and portal vein. Patent bilateral renal veins. Review of the MIP images confirms the above findings. NON-VASCULAR Lower chest: No pleural or pericardial effusion. Coronary calcifications. Calcified granuloma in the posterior basal segment right lower lobe. Hepatobiliary: No focal liver abnormality is seen. No gallstones, gallbladder wall thickening, or biliary dilatation. Pancreas: Scattered coarse calcifications in the pancreatic body and head. No discrete mass or ductal dilatation. Spleen: Normal in size.  Small calcified granulomas. Adrenals/Urinary Tract: Adrenal glands unremarkable. Stable bilateral renal cysts. No hydronephrosis. Urinary bladder incompletely distended. Stomach/Bowel: Stomach is decompressed. Small bowel is nondistended. 3.5 cm poorly marginated phlegmonous process adjacent to the appendix, with mild regional inflammatory/edematous change, new since previous. No discrete abscess. The colon is nondilated, unremarkable. Lymphatic: No abdominal or pelvic adenopathy. Reproductive: Mild prostate enlargement. Other: Bilateral pelvic phleboliths.  No ascites.  No free air. Musculoskeletal: Mild multilevel lumbar spondylitic change. No fracture or worrisome bone  lesion. IMPRESSION: 1. Periappendiceal 3.5 cm phlegmonous process with surrounding inflammatory change, new since 07/29/2020, suggesting smoldering appendicitis. No abscess. 2. Occluded celiac and superior mesenteric artery ostial stents, with collateral arterial supply from patent inferior mesenteric artery. 3. Coronary and Aortic Atherosclerosis (ICD10-170.0). Electronically Signed   By: Lucrezia Europe M.D.   On: 09/08/2020 16:41    Treatments: antibiotics: Zosyn Anticoagulation: Plavix, Aspirin, Heparin infusion   Discharge Exam: Blood pressure 115/72, pulse 64, temperature 98.2 F (36.8 C), temperature source Oral, resp. rate 18, height '6\' 1"'$  (1.854 m), weight 230 lb (104.3 kg), SpO2 96 %. Physical Exam Constitutional:      General: He is not in acute distress.    Appearance: Normal appearance. He is not ill-appearing.  HENT:     Mouth/Throat:     Mouth: Mucous membranes are moist.     Pharynx: Oropharynx is clear.  Cardiovascular:     Rate and Rhythm: Normal rate and regular rhythm.     Pulses: Normal pulses.     Heart sounds: Normal heart sounds.     Comments: Right CFA vascular access site: site is soft and non-tender, no evidence of hematoma. Gauze/tegaderm dressing is clean and dry.  Pulmonary:     Effort: Pulmonary effort is normal.     Breath sounds: Normal breath sounds.  Abdominal:     General: Bowel sounds are normal.     Palpations: Abdomen is soft.     Tenderness: no abdominal tenderness  Musculoskeletal:     Right lower leg: No edema.     Left lower leg: No edema.  Skin:    General: Skin is warm and dry.  Neurological:     Mental Status: He is alert and oriented to person, place, and time.  Psychiatric:        Mood and Affect: Mood normal.        Behavior: Behavior normal.        Thought Content: Thought content normal.        Judgment: Judgment normal.    Disposition: Discharge disposition: 01-Home or Self Care  Discharge Instructions     Place in  observation (patient's expected length of stay  will be less than 2 midnights)   Complete by: As directed    To be admitted under Dr. Ruthann Cancer      Allergies as of 09/16/2020   No Known Allergies      Medication List     TAKE these medications    acetaminophen 500 MG tablet Commonly known as: TYLENOL Take 2 tablets (1,000 mg total) by mouth every 6 (six) hours as needed for mild pain (or Fever >/= 101).   amoxicillin-clavulanate 875-125 MG tablet Commonly known as: Augmentin Take 1 tablet by mouth 2 (two) times daily for 9 days.   aspirin EC 81 MG tablet Take 81 mg by mouth daily. Swallow whole.   atorvastatin 20 MG tablet Commonly known as: LIPITOR Take 10 mg by mouth daily.   carvedilol 12.5 MG tablet Commonly known as: COREG Take 12.5 mg by mouth 2 (two) times daily.   clopidogrel 75 MG tablet Commonly known as: PLAVIX Take 1 tablet (75 mg total) by mouth daily. Start taking on: September 17, 2020   Coenzyme Q10 100 MG capsule Take 100 mg by mouth daily.   Cyanocobalamin 5000 MCG Subl Place 5,000 mcg under the tongue daily.   diltiazem 240 MG 24 hr capsule Commonly known as: CARDIZEM CD Take 240 mg by mouth daily.   furosemide 20 MG tablet Commonly known as: LASIX Take 10 mg by mouth daily.   lisinopril 30 MG tablet Commonly known as: ZESTRIL Take 30 mg by mouth 2 (two) times daily.   metFORMIN 1000 MG tablet Commonly known as: GLUCOPHAGE Take 500 mg by mouth 2 (two) times daily.   multivitamin with minerals tablet Take 1 tablet by mouth daily.   oxyCODONE 5 MG immediate release tablet Commonly known as: Oxy IR/ROXICODONE Take 1 tablet (5 mg total) by mouth every 4 (four) hours as needed for moderate pain.   pantoprazole 40 MG tablet Commonly known as: PROTONIX Take 40 mg by mouth 2 (two) times daily.   sertraline 100 MG tablet Commonly known as: ZOLOFT Take 100 mg by mouth daily.        Follow-up Information     [place] Follow up.   Why: Follow up in the clinic with Dr. Serafina Royals in one month for office visit and CTA abdomen/pelvis. Please call the clinic with any questions/concerns prior to your visit. Contact information: [place] [address] [id number]                  Electronically Signed: Theresa Duty, NP 09/16/2020, 10:10 AM   I have spent Greater Than 30 Minutes discharging Benjamin Fox.

## 2020-10-07 ENCOUNTER — Other Ambulatory Visit: Payer: Self-pay | Admitting: Interventional Radiology

## 2020-10-07 DIAGNOSIS — K551 Chronic vascular disorders of intestine: Secondary | ICD-10-CM

## 2020-10-17 ENCOUNTER — Other Ambulatory Visit: Payer: BC Managed Care – PPO

## 2020-10-21 ENCOUNTER — Other Ambulatory Visit: Payer: Self-pay

## 2020-10-21 ENCOUNTER — Ambulatory Visit (INDEPENDENT_AMBULATORY_CARE_PROVIDER_SITE_OTHER): Payer: BC Managed Care – PPO

## 2020-10-21 ENCOUNTER — Telehealth: Payer: BC Managed Care – PPO

## 2020-10-21 DIAGNOSIS — Z09 Encounter for follow-up examination after completed treatment for conditions other than malignant neoplasm: Secondary | ICD-10-CM

## 2020-10-21 DIAGNOSIS — K551 Chronic vascular disorders of intestine: Secondary | ICD-10-CM

## 2020-10-21 LAB — I-STAT CREATININE (MANUAL ENTRY): Creatinine, Ser: 0.5 (ref 0.50–1.10)

## 2020-10-21 MED ORDER — IOHEXOL 350 MG/ML SOLN
100.0000 mL | Freq: Once | INTRAVENOUS | Status: AC | PRN
  Administered 2020-10-21: 100 mL via INTRAVENOUS

## 2020-10-22 ENCOUNTER — Ambulatory Visit
Admission: RE | Admit: 2020-10-22 | Discharge: 2020-10-22 | Disposition: A | Discharge status: Home / Self Care | Payer: BC Managed Care – PPO | Source: Ambulatory Visit | Attending: Student | Admitting: Student

## 2020-10-22 ENCOUNTER — Other Ambulatory Visit: Payer: Self-pay | Admitting: Interventional Radiology

## 2020-10-22 DIAGNOSIS — K551 Chronic vascular disorders of intestine: Secondary | ICD-10-CM

## 2020-10-22 HISTORY — PX: IR RADIOLOGIST EVAL & MGMT: IMG5224

## 2020-10-22 NOTE — Progress Notes (Signed)
Reason for follow up: The patient is seen in follow up today s/p SMA stent recanalization on 09/15/20  History of present illness: 65 year old male with history of chronic mesenteric ischemia status post celiac and superior mesenteric artery stent placement in March 2022 at outside facility. 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 These complications resolved spontaneously however the patient's abdominal pain persisted in the patient developed acute complicated appendicitis, therefore underwent SMA stent recanalization with laser atherectomy and covered stent relining on 09/15/2020. The patient presents for short-term follow-up.   His pain is much better.  Occasional transient pain in RLQ.  He is eating well, no post prandial pain, gained 30 lbs over last month which he is satisfied with.    He reports a recent COVID infection, treated with antiviral 1 week ago.  He reports some shortness of breath 2 nights ago which has resolved.  He continues to take aspirin 81 mg QD, Plavix 75 mg QD, and Xarelto 10 mg QD per Cardiology.  No past medical history on file.  Past Surgical History:  Procedure Laterality Date   IR AORTAGRAM ABDOMINAL SERIALOGRAM  09/15/2020   IR RADIOLOGIST EVAL & MGMT  07/25/2020   IR RADIOLOGIST EVAL & MGMT  08/28/2020   IR THROMBECT PRIM MECH INIT (INCLU) MOD SED  09/15/2020   IR TRANSCATH PLC STENT 1ST ART NOT LE CV CAR VERT CAR  09/15/2020   IR US GUIDE VASC ACCESS RIGHT  09/15/2020    Allergies: Patient has no known allergies.  Medications: Prior to Admission medications   Medication Sig Start Date End Date Taking? Authorizing Provider  acetaminophen (TYLENOL) 500 MG tablet Take 2 tablets (1,000 mg total) by mouth every 6 (six) hours as needed for mild pain (or Fever >/=  101). Patient not taking: Reported on 09/15/2020 09/12/20   Saverio Danker, PA-C  aspirin EC 81 MG tablet Take 81 mg by mouth daily. Swallow whole.    [provider]  atorvastatin (LIPITOR) 20 MG tablet Take 10 mg by mouth daily. 06/24/20   [provider]  carvedilol (COREG) 12.5 MG tablet Take 12.5 mg by mouth 2 (two) times daily. 06/09/20   [provider]  clopidogrel (PLAVIX) 75 MG tablet Take 1 tablet (75 mg total) by mouth daily. 09/17/20   Theresa Duty, NP  Coenzyme Q10 100 MG capsule Take 100 mg by mouth daily.    [provider]  Cyanocobalamin 5000 MCG SUBL Place 5,000 mcg under the tongue daily. 10/28/15   [provider]  diltiazem (CARDIZEM CD) 240 MG 24 hr capsule Take 240 mg by mouth daily. 07/20/20   [provider]  furosemide (LASIX) 20 MG tablet Take 10 mg by mouth daily. 08/18/15   [provider]  lisinopril (ZESTRIL) 30 MG tablet Take 30 mg by mouth 2 (two) times daily. 07/16/20   [provider]  metFORMIN (GLUCOPHAGE) 1000 MG tablet Take 500 mg by mouth 2 (two) times daily. 06/09/20   [provider]  Multiple Vitamins-Minerals (MULTIVITAMIN WITH MINERALS) tablet Take 1 tablet by mouth daily. Patient not taking: Reported on 09/15/2020    [provider]  oxyCODONE (OXY IR/ROXICODONE) 5 MG immediate release tablet Take 1 tablet (5 mg total) by mouth every 4 (four) hours as needed for moderate pain. 09/12/20   Saverio Danker, PA-C  pantoprazole (PROTONIX) 40 MG tablet Take 40 mg by  mouth 2 (two) times daily. 04/12/20   [provider]  sertraline (ZOLOFT) 100 MG tablet Take 100 mg by mouth daily. 08/05/20   [provider]     No family history on file.  Social History   Socioeconomic History   Marital status: Married    Spouse name: Not on file   Number of children: Not on file   Years of education: Not on file   Highest education level: Not on file  Occupational  History   Not on file  Tobacco Use   Smoking status: Former    Types: Cigarettes   Smokeless tobacco: Never  Vaping Use   Vaping Use: Never used  Substance and Sexual Activity   Alcohol use: Not Currently   Drug use: Never   Sexual activity: Not on file  Other Topics Concern   Not on file  Social History Narrative   Not on file   Social Determinants of Health   Financial Resource Strain: Not on file  Food Insecurity: Not on file  Transportation Needs: Not on file  Physical Activity: Not on file  Stress: Not on file  Social Connections: Not on file     Vital Signs: There were no vitals taken for this visit.  No physical examination in lieu of telephone visit.  Imaging: CT ANGIO ABDOMEN W &/OR WO CONTRAST  Result Date: 10/22/2020 CLINICAL DATA:  65 year old male with history of chronic mesenteric ischemia status post celiac and superior mesenteric artery stent placement in March 2022 at outside facility. 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 These complications resolved spontaneously however the patient's abdominal pain persisted in the patient developed acute complicated appendicitis, therefore underwent SMA stent recanalization with laser atherectomy and covered stent relining on 09/15/2020. The patient presents for short-term follow-up. EXAM: CTA ABDOMEN AND PELVIS WITH CONTRAST TECHNIQUE: Multidetector CT imaging of the abdomen and pelvis was performed using the standard protocol during bolus administration of intravenous contrast. Multiplanar reconstructed images and MIPs were obtained and reviewed to evaluate the vascular anatomy. CONTRAST:  145mL OMNIPAQUE IOHEXOL 350 MG/ML SOLN COMPARISON:  09/15/2020, 09/10/2020 FINDINGS: VASCULAR Aorta: Normal caliber and patent throughout. Scattered  fibrofatty and calcific atherosclerotic changes, most prominent in the infrarenal segment. Celiac: Indwelling celiac ostial stent remains occluded. There is distal reconstitution and patency of the splenic and hepatic arteries. SMA: Ostial stent in unchanged position which appears patent. The distal SMA appears patent with scattered atherosclerotic calcifications. No evidence of aneurysm formation. Renals: Dual right and single left renal arteries. There is focal severe stenosis secondary to atherosclerotic plaque of the more superior right renal artery which is patent distally, unchanged. The inferior right and the single left renal arteries are patent IMA: Moderate ostial stenosis secondary to atherosclerotic plaque. Patent distally. Inflow: Patent without evidence of aneurysm, dissection, vasculitis or significant stenosis. Proximal Outflow: Bilateral common femoral and visualized portions of the superficial and profunda femoral arteries are patent without evidence of aneurysm, dissection, vasculitis or significant stenosis. Veins: The hepatic veins are widely patent. The portal system is widely patent and normal in caliber. The renal veins are patent bilaterally in standard anatomic configuration. No evidence of iliocaval thrombosis or anomaly. Review of the MIP images confirms the above findings. NON-VASCULAR Lower chest: Interval worsening of small right and trace left pleural effusions with associated bibasilar dependent subsegmental atelectasis. Hepatobiliary: No focal liver abnormality is seen. No  gallstones, gallbladder wall thickening, or biliary dilatation. Pancreas: Similar-appearing mild central pancreatic ductal dilation. Scattered punctate calcifications in the pancreatic head neck, unchanged. No evidence of peripancreatic inflammatory changes or fluid collections. Spleen: Normal in size.  Scattered punctate calcified granulomas. Adrenals/Urinary Tract: Adrenal glands are unremarkable. Unchanged  appearance of bilateral simple renal cysts, the largest about the right inferior pole measuring up to approximately 3.7 cm. Kidneys are normal, without renal calculi, focal lesion, or hydronephrosis. Bladder is unremarkable. Stomach/Bowel: Stomach is within normal limits. Significant interval improvement of inflammatory changes about the appendix, now with mild residual thickening measuring up to approximately 1 cm at the appendiceal tip. No significant surrounding fluid collections or abnormal enhancement. Redundant sigmoid colon. No evidence of bowel wall thickening, distention, or inflammatory changes. Lymphatic: No abdominopelvic lymphadenopathy. Reproductive: Prostate is unremarkable. Other: No abdominal wall hernia or abnormality. No abdominopelvic ascites. Musculoskeletal: Similar appearing mild multilevel degenerative changes of the visualized thoracolumbar spine. No acute osseous abnormality or aggressive appearing osseous lesion. IMPRESSION: VASCULAR 1. The indwelling superior mesenteric ostial stent appears patent in unchanged position. 2. Persistently occluded indwelling celiac ostial stent. 3.  Aortic Atherosclerosis (ICD10-I70.0). NON-VASCULAR 1. Significant interval resolution of previously visualized complicated appendicitis. No discernible periappendiceal fluid collection. 2. Interval enlargement of small right and trace left pleural effusions with associated passive atelectasis. Ruthann Cancer, MD [facility] Electronically Signed   By: Ruthann Cancer M.D.   On: 10/22/2020 08:11    Labs:  CBC: Recent Labs    09/11/20 0336 09/15/20 0630 09/15/20 1847 09/16/20 0350  WBC 6.3 7.7 8.4 6.8  HGB 8.4* 9.2* 9.8* 8.3*  HCT 27.1* 30.6* 31.7* 26.7*  PLT 231 306 260 238    COAGS: Recent Labs    09/15/20 0630  INR 1.0    BMP: Recent Labs    09/10/20 1324 09/11/20 0336 09/15/20 0630 09/16/20 0350 10/21/20 1521  NA 137 138 141  138  --   K 3.2* 3.5 3.5 3.4*  --   CL 101 102 107 107  --   CO2 26 26 27 25   --   GLUCOSE 87 81 102* 91  --   BUN 12 9 13 8   --   CALCIUM 8.8* 9.0 9.0 8.4*  --   CREATININE 0.58* 0.60* 0.66 0.61 0.50  GFRNONAA >60 >60 >60 >60  --     LIVER FUNCTION TESTS: Recent Labs    09/10/20 1324 09/15/20 0630  BILITOT 0.6 0.5  AST 12* 17  ALT 10 13  ALKPHOS 175* 205*  PROT 6.5 7.0  ALBUMIN 2.9* 3.0*    Assessment: 65 year old male with history of chronic mesentericischemia status post celiac and superior mesenteric artery stent placement in March 2022 at outside facility. 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 These complications resolved spontaneously however the patient's abdominal pain persisted in the patient developed acute complicated appendicitis, therefore underwent SMA stent recanalization with laser atherectomy and covered stent relining on 09/15/2020.   SMA stent remains patent without clinical signs or symptoms of mesenteric ischemia.  Appendicitis resolved clinically and on CT.  Has bilateral pleural effusions which are increased from comparison, likely related to recent COVID.  He is currently on dual antiplatelet plus anticoagulant, which is likely too much.    Plan -stop aspirin, continue Plavix and Xarelto -PA and lateral chest x-ray in 2 weeks to follow up pleural  effusions -mesenteric abdominal duplex in 3 months to monitor SMA stent patency   Electronically Signed: Rosanne Ashing Jackob Crookston 10/22/2020, 9:35 AM   I spent a total of 25 Minutes in telephone clinical consultation, greater than 50% of which was counseling/coordinating care for mesenteric ischemia

## 2020-10-24 ENCOUNTER — Other Ambulatory Visit: Payer: Self-pay | Admitting: Interventional Radiology

## 2020-10-24 DIAGNOSIS — J90 Pleural effusion, not elsewhere classified: Secondary | ICD-10-CM

## 2020-11-10 ENCOUNTER — Other Ambulatory Visit: Payer: Self-pay

## 2020-11-10 ENCOUNTER — Ambulatory Visit (INDEPENDENT_AMBULATORY_CARE_PROVIDER_SITE_OTHER): Payer: BC Managed Care – PPO

## 2020-11-10 DIAGNOSIS — J90 Pleural effusion, not elsewhere classified: Secondary | ICD-10-CM

## 2020-12-09 ENCOUNTER — Other Ambulatory Visit: Payer: Self-pay | Admitting: Interventional Radiology

## 2020-12-09 DIAGNOSIS — K551 Chronic vascular disorders of intestine: Secondary | ICD-10-CM

## 2021-01-08 ENCOUNTER — Encounter: Payer: Self-pay | Admitting: *Deleted

## 2021-01-08 ENCOUNTER — Ambulatory Visit
Admission: RE | Admit: 2021-01-08 | Discharge: 2021-01-08 | Disposition: A | Discharge status: Home / Self Care | Payer: BC Managed Care – PPO | Source: Ambulatory Visit | Attending: Interventional Radiology | Admitting: Interventional Radiology

## 2021-01-08 DIAGNOSIS — K551 Chronic vascular disorders of intestine: Secondary | ICD-10-CM

## 2021-01-08 HISTORY — PX: IR RADIOLOGIST EVAL & MGMT: IMG5224

## 2021-01-08 NOTE — Progress Notes (Signed)
Reason for follow up: The patient is seen in follow up today s/p SMA stent recanalization on 09/15/20   History of present illness: 65 year old male with history of chronic mesenteric ischemia status post celiac and superior mesenteric artery stent placement in March 2022 at outside facility. 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 These complications resolved spontaneously however the patient's abdominal pain persisted in the patient developed acute complicated appendicitis, therefore underwent SMA stent recanalization with laser atherectomy and covered stent relining on 09/15/2020.   At his last visit, CTA was performed which demonstrated patency of the stent.  He endorsed occasional RLQ pain.  We discontinued his aspirin, and he remains on Plavix 75 QD and Xarelto 10 mg QD as per Cardiology.  He was overcoming a recent COVID infection and had small pleural effusions, for which CXR was obtained on 11/10/20 which demonstrated similar small right and trace left pleural effusions.  He continues to feel well without any significant abdominal pain. He has an great appetite.  He reports occasional twinges of pain which are transient and mild.  He remains compliant with Xarelto and Plavix.  His respiratory symptoms have resolved entirely.   No past medical history on file.  Past Surgical History:  Procedure Laterality Date   IR AORTAGRAM ABDOMINAL SERIALOGRAM  09/15/2020   IR RADIOLOGIST EVAL & MGMT  07/25/2020   IR RADIOLOGIST EVAL & MGMT  08/28/2020   IR RADIOLOGIST EVAL & MGMT  10/22/2020   IR THROMBECT PRIM MECH INIT (INCLU) MOD SED  09/15/2020   IR TRANSCATH PLC STENT 1ST ART NOT LE CV CAR VERT CAR  09/15/2020   IR US GUIDE VASC ACCESS RIGHT  09/15/2020    Allergies: Patient has no known  allergies.  Medications: Prior to Admission medications   Medication Sig Start Date End Date Taking? Authorizing Provider  acetaminophen (TYLENOL) 500 MG tablet Take 2 tablets (1,000 mg total) by mouth every 6 (six) hours as needed for mild pain (or Fever >/= 101). Patient not taking: Reported on 09/15/2020 09/12/20   Saverio Danker, PA-C  aspirin EC 81 MG tablet Take 81 mg by mouth daily. Swallow whole.    [provider]  atorvastatin (LIPITOR) 20 MG tablet Take 10 mg by mouth daily. 06/24/20   [provider]  carvedilol (COREG) 12.5 MG tablet Take 12.5 mg by mouth 2 (two) times daily. 06/09/20   [provider]  clopidogrel (PLAVIX) 75 MG tablet Take 1 tablet (75 mg total) by mouth daily. 09/17/20   Theresa Duty, NP  Coenzyme Q10 100 MG capsule Take 100 mg by mouth daily.    [provider]  Cyanocobalamin 5000 MCG SUBL Place 5,000 mcg under the tongue daily. 10/28/15   [provider]  diltiazem (CARDIZEM CD) 240 MG 24 hr capsule Take 240 mg by mouth daily. 07/20/20   [provider]  furosemide (LASIX) 20 MG tablet Take 10 mg by mouth daily. 08/18/15   [provider]  lisinopril (ZESTRIL) 30 MG tablet Take 30 mg by mouth 2 (two) times daily. 07/16/20   [provider]  metFORMIN (GLUCOPHAGE) 1000 MG tablet Take 500 mg by mouth 2 (two) times daily. 06/09/20   [provider]  Multiple Vitamins-Minerals (MULTIVITAMIN WITH MINERALS) tablet Take 1 tablet by mouth daily. Patient not taking: Reported on 09/15/2020    [provider]  oxyCODONE (OXY IR/ROXICODONE)  5 MG immediate release tablet Take 1 tablet (5 mg total) by mouth every 4 (four) hours as needed for moderate pain. 09/12/20   Saverio Danker, PA-C  pantoprazole (PROTONIX) 40 MG tablet Take 40 mg by mouth 2 (two) times daily. 04/12/20   [provider]  sertraline (ZOLOFT) 100 MG tablet Take 100 mg by mouth daily. 08/05/20   [provider]     No family history on file.  Social History   Socioeconomic History   Marital status: Married    Spouse name: Not on file   Number of children: Not on file   Years of education: Not on file   Highest education level: Not on file  Occupational History   Not on file  Tobacco Use   Smoking status: Former    Types: Cigarettes   Smokeless tobacco: Never  Vaping Use   Vaping Use: Never used  Substance and Sexual Activity   Alcohol use: Not Currently   Drug use: Never   Sexual activity: Not on file  Other Topics Concern   Not on file  Social History Narrative   Not on file   Social Determinants of Health   Financial Resource Strain: Not on file  Food Insecurity: Not on file  Transportation Needs: Not on file  Physical Activity: Not on file  Stress: Not on file  Social Connections: Not on file     Vital Signs: There were no vitals taken for this visit.  Physical Exam Constitutional:      General: He is not in acute distress. HENT:     Head: Normocephalic.     Mouth/Throat:     Mouth: Mucous membranes are moist.  Cardiovascular:     Rate and Rhythm: Normal rate and regular rhythm.  Pulmonary:     Breath sounds: Normal breath sounds.  Abdominal:     General: There is no distension.     Palpations: Abdomen is soft.     Tenderness: There is no abdominal tenderness.  Musculoskeletal:     Right lower leg: No edema.     Left lower leg: No edema.  Skin:    General: Skin is warm and dry.  Neurological:     Mental Status: He is alert and oriented to person, place, and time.    Imaging: CXR 11/10/20    Mesenteric Duplex (01/08/21): Patent SMA stent with velocities of 287 cm/s within the stent.  Patent mid and distal SMA.  Occluded celiac stent with patent distal celiac.    Labs: No recent pertinent labs.  Assessment: 65 year old male with history of chronic mesenteric ischemia status post celiac and superior mesenteric artery stent  placement in March 2022 at outside facility. 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 These complications resolved spontaneously however the patient's abdominal pain persisted in the patient developed acute complicated appendicitis, therefore underwent SMA stent recanalization with laser atherectomy and covered stent relining on 09/15/2020.    SMA stent remains patent without clinical signs or symptoms of mesenteric ischemia.   Plan: -continue Plavix and Xarelto -mesenteric abdominal duplex in 3 months to monitor SMA stent patency   Electronically Signed: Rosanne Ashing Aviyanna Colbaugh 01/08/2021, 8:21 AM   I spent a total of 40 Minutes in face to face in clinical consultation, greater than 50% of which was counseling/coordinating care for mesenteric ischemia.

## 2021-01-16 ENCOUNTER — Other Ambulatory Visit: Payer: Self-pay

## 2021-01-16 ENCOUNTER — Inpatient Hospital Stay (HOSPITAL_COMMUNITY)
Admission: EM | Admit: 2021-01-16 | Discharge: 2021-01-21 | DRG: 439 | Disposition: A | Discharge status: Home / Self Care | Payer: BC Managed Care – PPO | Attending: Internal Medicine | Admitting: Internal Medicine

## 2021-01-16 ENCOUNTER — Emergency Department (HOSPITAL_COMMUNITY): Payer: BC Managed Care – PPO

## 2021-01-16 ENCOUNTER — Encounter (HOSPITAL_COMMUNITY): Payer: Self-pay | Admitting: Internal Medicine

## 2021-01-16 DIAGNOSIS — Z95820 Peripheral vascular angioplasty status with implants and grafts: Secondary | ICD-10-CM

## 2021-01-16 DIAGNOSIS — D72829 Elevated white blood cell count, unspecified: Secondary | ICD-10-CM | POA: Diagnosis present

## 2021-01-16 DIAGNOSIS — E876 Hypokalemia: Secondary | ICD-10-CM | POA: Diagnosis not present

## 2021-01-16 DIAGNOSIS — K859 Acute pancreatitis without necrosis or infection, unspecified: Principal | ICD-10-CM | POA: Diagnosis present

## 2021-01-16 DIAGNOSIS — E1165 Type 2 diabetes mellitus with hyperglycemia: Secondary | ICD-10-CM | POA: Diagnosis present

## 2021-01-16 DIAGNOSIS — Z7982 Long term (current) use of aspirin: Secondary | ICD-10-CM

## 2021-01-16 DIAGNOSIS — E119 Type 2 diabetes mellitus without complications: Secondary | ICD-10-CM

## 2021-01-16 DIAGNOSIS — Z8249 Family history of ischemic heart disease and other diseases of the circulatory system: Secondary | ICD-10-CM

## 2021-01-16 DIAGNOSIS — K56609 Unspecified intestinal obstruction, unspecified as to partial versus complete obstruction: Secondary | ICD-10-CM

## 2021-01-16 DIAGNOSIS — E1159 Type 2 diabetes mellitus with other circulatory complications: Secondary | ICD-10-CM | POA: Diagnosis present

## 2021-01-16 DIAGNOSIS — D72825 Bandemia: Secondary | ICD-10-CM | POA: Diagnosis present

## 2021-01-16 DIAGNOSIS — I482 Chronic atrial fibrillation, unspecified: Secondary | ICD-10-CM | POA: Diagnosis present

## 2021-01-16 DIAGNOSIS — I4819 Other persistent atrial fibrillation: Secondary | ICD-10-CM | POA: Diagnosis present

## 2021-01-16 DIAGNOSIS — I2510 Atherosclerotic heart disease of native coronary artery without angina pectoris: Secondary | ICD-10-CM | POA: Diagnosis present

## 2021-01-16 DIAGNOSIS — G4733 Obstructive sleep apnea (adult) (pediatric): Secondary | ICD-10-CM | POA: Diagnosis present

## 2021-01-16 DIAGNOSIS — E1169 Type 2 diabetes mellitus with other specified complication: Secondary | ICD-10-CM | POA: Diagnosis present

## 2021-01-16 DIAGNOSIS — E785 Hyperlipidemia, unspecified: Secondary | ICD-10-CM | POA: Diagnosis present

## 2021-01-16 DIAGNOSIS — K551 Chronic vascular disorders of intestine: Secondary | ICD-10-CM | POA: Diagnosis present

## 2021-01-16 DIAGNOSIS — K2980 Duodenitis without bleeding: Secondary | ICD-10-CM | POA: Diagnosis present

## 2021-01-16 DIAGNOSIS — D509 Iron deficiency anemia, unspecified: Secondary | ICD-10-CM | POA: Diagnosis present

## 2021-01-16 DIAGNOSIS — I152 Hypertension secondary to endocrine disorders: Secondary | ICD-10-CM | POA: Diagnosis present

## 2021-01-16 DIAGNOSIS — D62 Acute posthemorrhagic anemia: Secondary | ICD-10-CM | POA: Diagnosis present

## 2021-01-16 DIAGNOSIS — F419 Anxiety disorder, unspecified: Secondary | ICD-10-CM | POA: Diagnosis present

## 2021-01-16 DIAGNOSIS — Z7984 Long term (current) use of oral hypoglycemic drugs: Secondary | ICD-10-CM

## 2021-01-16 DIAGNOSIS — Z20822 Contact with and (suspected) exposure to covid-19: Secondary | ICD-10-CM | POA: Diagnosis present

## 2021-01-16 DIAGNOSIS — Z87891 Personal history of nicotine dependence: Secondary | ICD-10-CM

## 2021-01-16 DIAGNOSIS — F32A Depression, unspecified: Secondary | ICD-10-CM | POA: Diagnosis present

## 2021-01-16 DIAGNOSIS — Z7902 Long term (current) use of antithrombotics/antiplatelets: Secondary | ICD-10-CM

## 2021-01-16 DIAGNOSIS — Z7901 Long term (current) use of anticoagulants: Secondary | ICD-10-CM

## 2021-01-16 DIAGNOSIS — D638 Anemia in other chronic diseases classified elsewhere: Secondary | ICD-10-CM | POA: Diagnosis present

## 2021-01-16 DIAGNOSIS — Z955 Presence of coronary angioplasty implant and graft: Secondary | ICD-10-CM

## 2021-01-16 DIAGNOSIS — Z79899 Other long term (current) drug therapy: Secondary | ICD-10-CM

## 2021-01-16 HISTORY — DX: Type 2 diabetes mellitus with other specified complication: E11.69

## 2021-01-16 HISTORY — DX: Type 2 diabetes mellitus without complications: E11.9

## 2021-01-16 HISTORY — DX: Chronic vascular disorders of intestine: K55.1

## 2021-01-16 HISTORY — DX: Atherosclerotic heart disease of native coronary artery without angina pectoris: I25.10

## 2021-01-16 HISTORY — DX: Anemia in other chronic diseases classified elsewhere: D63.8

## 2021-01-16 HISTORY — DX: Chronic atrial fibrillation, unspecified: I48.20

## 2021-01-16 HISTORY — DX: Hypertension secondary to endocrine disorders: I15.2

## 2021-01-16 HISTORY — DX: Obstructive sleep apnea (adult) (pediatric): G47.33

## 2021-01-16 HISTORY — DX: Type 2 diabetes mellitus with other circulatory complications: E11.59

## 2021-01-16 LAB — COMPREHENSIVE METABOLIC PANEL
ALT: 11 U/L (ref 0–44)
AST: 15 U/L (ref 15–41)
Albumin: 3.6 g/dL (ref 3.5–5.0)
Alkaline Phosphatase: 68 U/L (ref 38–126)
Anion gap: 10 (ref 5–15)
BUN: 19 mg/dL (ref 8–23)
CO2: 22 mmol/L (ref 22–32)
Calcium: 9 mg/dL (ref 8.9–10.3)
Chloride: 107 mmol/L (ref 98–111)
Creatinine, Ser: 0.83 mg/dL (ref 0.61–1.24)
GFR, Estimated: >60 mL/min (ref >60)
Glucose, Bld: 149 mg/dL — ABNORMAL HIGH (ref 70–99)
Potassium: 3.5 mmol/L (ref 3.5–5.1)
Sodium: 139 mmol/L (ref 135–145)
Total Bilirubin: 0.8 mg/dL (ref 0.3–1.2)
Total Protein: 7.4 g/dL (ref 6.5–8.1)

## 2021-01-16 LAB — URINALYSIS, ROUTINE W REFLEX MICROSCOPIC
Bilirubin Urine: NEGATIVE
Glucose, UA: NEGATIVE mg/dL
Hgb urine dipstick: NEGATIVE
Ketones, ur: NEGATIVE mg/dL
Leukocytes,Ua: NEGATIVE
Nitrite: NEGATIVE
Protein, ur: NEGATIVE mg/dL
Specific Gravity, Urine: 1.015 (ref 1.005–1.030)
pH: 5 (ref 5.0–8.0)

## 2021-01-16 LAB — CBC
HCT: 29.7 % — ABNORMAL LOW (ref 39.0–52.0)
Hemoglobin: 8.6 g/dL — ABNORMAL LOW (ref 13.0–17.0)
MCH: 24.3 pg — ABNORMAL LOW (ref 26.0–34.0)
MCHC: 29 g/dL — ABNORMAL LOW (ref 30.0–36.0)
MCV: 83.9 fL (ref 80.0–100.0)
Platelets: 258 10*3/uL (ref 150–400)
RBC: 3.54 MIL/uL — ABNORMAL LOW (ref 4.22–5.81)
RDW: 18.6 % — ABNORMAL HIGH (ref 11.5–15.5)
WBC: 11.6 10*3/uL — ABNORMAL HIGH (ref 4.0–10.5)
nRBC: 0 % (ref 0.0–0.2)

## 2021-01-16 LAB — CBG MONITORING, ED: Glucose-Capillary: 127 mg/dL — ABNORMAL HIGH (ref 70–99)

## 2021-01-16 LAB — RESP PANEL BY RT-PCR (FLU A&B, COVID) ARPGX2
Influenza A by PCR: NEGATIVE
Influenza B by PCR: NEGATIVE
SARS Coronavirus 2 by RT PCR: NEGATIVE

## 2021-01-16 LAB — LACTIC ACID, PLASMA: Lactic Acid, Venous: 1.1 mmol/L (ref 0.5–1.9)

## 2021-01-16 LAB — LIPASE, BLOOD: Lipase: 445 U/L — ABNORMAL HIGH (ref 11–51)

## 2021-01-16 MED ORDER — LISINOPRIL 20 MG PO TABS
30.0000 mg | ORAL_TABLET | Freq: Two times a day (BID) | ORAL | Status: DC
  Administered 2021-01-17 – 2021-01-21 (×9): 30 mg via ORAL
  Filled 2021-01-16 (×9): qty 1

## 2021-01-16 MED ORDER — MORPHINE SULFATE (PF) 4 MG/ML IV SOLN
4.0000 mg | Freq: Once | INTRAVENOUS | Status: AC
  Administered 2021-01-16: 16:00:00 4 mg via INTRAVENOUS
  Filled 2021-01-16: qty 1

## 2021-01-16 MED ORDER — ONDANSETRON HCL 4 MG/2ML IJ SOLN
4.0000 mg | Freq: Four times a day (QID) | INTRAMUSCULAR | Status: DC | PRN
  Administered 2021-01-21: 05:00:00 4 mg via INTRAVENOUS
  Filled 2021-01-16: qty 2

## 2021-01-16 MED ORDER — SODIUM CHLORIDE 0.9 % IV BOLUS
1000.0000 mL | Freq: Once | INTRAVENOUS | Status: AC
  Administered 2021-01-16: 16:00:00 1000 mL via INTRAVENOUS

## 2021-01-16 MED ORDER — INSULIN ASPART 100 UNIT/ML IJ SOLN
0.0000 [IU] | INTRAMUSCULAR | Status: DC
  Administered 2021-01-17 – 2021-01-18 (×6): 1 [IU] via SUBCUTANEOUS
  Administered 2021-01-18: 21:00:00 2 [IU] via SUBCUTANEOUS
  Administered 2021-01-18 – 2021-01-21 (×9): 1 [IU] via SUBCUTANEOUS

## 2021-01-16 MED ORDER — MORPHINE SULFATE (PF) 2 MG/ML IV SOLN
2.0000 mg | INTRAVENOUS | Status: DC | PRN
  Administered 2021-01-17 (×3): 2 mg via INTRAVENOUS
  Filled 2021-01-16 (×3): qty 1

## 2021-01-16 MED ORDER — ONDANSETRON HCL 4 MG/2ML IJ SOLN
4.0000 mg | Freq: Once | INTRAMUSCULAR | Status: AC
  Administered 2021-01-16: 16:00:00 4 mg via INTRAVENOUS
  Filled 2021-01-16: qty 2

## 2021-01-16 MED ORDER — SERTRALINE HCL 100 MG PO TABS
100.0000 mg | ORAL_TABLET | Freq: Every evening | ORAL | Status: DC
  Administered 2021-01-17 – 2021-01-20 (×4): 100 mg via ORAL
  Filled 2021-01-16 (×4): qty 1

## 2021-01-16 MED ORDER — MORPHINE SULFATE (PF) 4 MG/ML IV SOLN
4.0000 mg | Freq: Once | INTRAVENOUS | Status: AC
  Administered 2021-01-16: 19:00:00 4 mg via INTRAVENOUS
  Filled 2021-01-16: qty 1

## 2021-01-16 MED ORDER — PIPERACILLIN-TAZOBACTAM 3.375 G IVPB 30 MIN
3.3750 g | Freq: Once | INTRAVENOUS | Status: AC
  Administered 2021-01-16: 23:00:00 3.375 g via INTRAVENOUS
  Filled 2021-01-16: qty 50

## 2021-01-16 MED ORDER — ONDANSETRON HCL 4 MG PO TABS
4.0000 mg | ORAL_TABLET | Freq: Four times a day (QID) | ORAL | Status: DC | PRN
  Administered 2021-01-17: 09:00:00 4 mg via ORAL
  Filled 2021-01-16: qty 1

## 2021-01-16 MED ORDER — DILTIAZEM HCL ER COATED BEADS 240 MG PO CP24
240.0000 mg | ORAL_CAPSULE | Freq: Every morning | ORAL | Status: DC
  Administered 2021-01-17 – 2021-01-21 (×5): 240 mg via ORAL
  Filled 2021-01-16 (×6): qty 1

## 2021-01-16 MED ORDER — IOHEXOL 350 MG/ML SOLN
100.0000 mL | Freq: Once | INTRAVENOUS | Status: AC | PRN
  Administered 2021-01-16: 17:00:00 100 mL via INTRAVENOUS

## 2021-01-16 MED ORDER — CARVEDILOL 25 MG PO TABS
25.0000 mg | ORAL_TABLET | Freq: Two times a day (BID) | ORAL | Status: DC
  Administered 2021-01-17 – 2021-01-21 (×9): 25 mg via ORAL
  Filled 2021-01-16: qty 1
  Filled 2021-01-16: qty 2
  Filled 2021-01-16 (×7): qty 1

## 2021-01-16 MED ORDER — LACTATED RINGERS IV SOLN: INTRAVENOUS | Status: DC

## 2021-01-16 MED ORDER — RIVAROXABAN 20 MG PO TABS
20.0000 mg | ORAL_TABLET | Freq: Every day | ORAL | Status: DC
  Administered 2021-01-17 – 2021-01-18 (×2): 20 mg via ORAL
  Filled 2021-01-16 (×2): qty 1

## 2021-01-16 MED ORDER — ATORVASTATIN CALCIUM 10 MG PO TABS
5.0000 mg | ORAL_TABLET | Freq: Two times a day (BID) | ORAL | Status: DC
  Administered 2021-01-17 – 2021-01-21 (×9): 5 mg via ORAL
  Filled 2021-01-16 (×9): qty 1

## 2021-01-16 MED ORDER — SODIUM CHLORIDE 0.9% FLUSH
3.0000 mL | Freq: Two times a day (BID) | INTRAVENOUS | Status: DC
  Administered 2021-01-16 – 2021-01-20 (×8): 3 mL via INTRAVENOUS

## 2021-01-16 MED ORDER — CLOPIDOGREL BISULFATE 75 MG PO TABS
75.0000 mg | ORAL_TABLET | Freq: Every morning | ORAL | Status: DC
  Administered 2021-01-17 – 2021-01-19 (×3): 75 mg via ORAL
  Filled 2021-01-16 (×3): qty 1

## 2021-01-16 NOTE — Progress Notes (Signed)
I was asked to evaluate this patient who underwent celiac and superior mesenteric artery stenting for chronic mesenteric ischemia 04/02/2020 at [facility] by Dr. Renard Matter. This was complicated by stent thrombosis on 04/08/2020.  No further intervention was performed in the mesenteric circulation at [facility] as best I can tell in care everywhere.  He developed postoperative pancreatitis.  He underwent SMA stent recanalization with laser atherectomy 09/15/2020 with Dr. Serafina Royals of interventional radiology.  On my evaluation the patient reports a day of epigastric abdominal pain, similar to prior bouts of pancreatitis.  He reports some nausea and vomiting.  No fevers or chills.  On exam BP (!) 153/92    Pulse 74    Temp 98 F (36.7 C) (Oral)    Resp 20    SpO2 90%  No acute distress Regular rate and rhythm Unlabored breathing Tender abdomen in the periumbilical and epigastric abdomen.  No peritonitis.   2+ radial pulses bilaterally.   2+ DP pulses bilaterally  CT angiogram personally reviewed in detail.  The celiac and SMA stents appear widely patent.  The native arteries distal to the stents opacify with contrast.  There is a inflammatory phlegmon in the mid retroperitoneum consistent with chronic pancreatitis and duodenitis.  Labs reviewed personally in detail.  Chemistry panel unremarkable.  Lipase is 445.  He has a mild leukocytosis at 11.6.  Prior stenting by [facility] and interventional radiology appears patent.  Distal vessels opacify with contrast.  I have a very low suspicion for acute mesenteric ischemia I think his symptoms are most likely related to chronic pancreatic inflammation.  This patient has established care with interventional radiology, who are following.  No role for further vascular intervention from my standpoint.  Please call for questions.  Yevonne Aline. Stanford Breed, MD [facility] Phone Number: [phone] 01/16/2021 7:46 PM

## 2021-01-16 NOTE — H&P (Signed)
History and Physical    Benjamin Fox UKG:[id number] DOB: December 06, 1955 DOA: 01/16/2021  PCP: [place], Julie M, NP  Patient coming from: Home  I have personally briefly reviewed patient's old medical records in [place]  Chief Complaint: Abdominal pain  HPI: Benjamin Fox is a 65 y.o. male with medical history significant for chronic atrial fibrillation on Xarelto, chronic mesenteric ischemia (s/p celiac and SMA stenting and subsequent thrombectomy for occluded stents 09/15/2020), CAD s/p RCA stenting, T2DM, HTN, HLD, anemia of chronic disease, depression/anxiety, and OSA on CPAP who presented to the ED for evaluation of abdominal pain.  Patient reports new onset of severe sharp nonradiating epigastric pain beginning evening of 12/22 right before eating dinner.  He says he is able to eat about one third of his meal however in the middle of the night he began to develop nausea followed by emesis.  He has had ongoing nausea and vomiting since then with last episode occurring while in the ED.  He has had some loose stools but no watery diarrhea.  He denies any obvious bleeding.  Patient states his abdominal pain was similar to prior episode of pancreatitis.  He denies any alcohol use.  He has not been able to keep his medications down.  ED Course:  Initial vitals showed BP 162/100, pulse 97, RR 16, temp 90.3 F, SPO2 97% on room air.  Labs show sodium 139, potassium 3.5, bicarb 22, BUN 19, creatinine 0.83, serum glucose 149, LFTs within normal limits, lipase 445, WBC 11.6, hemoglobin 8.6, platelets 258,000, lactic acid 1.1.  IR were consulted and recommended CTA abdomen and pelvis.  CTA abdomen/pelvis showed patent celiac and superior mesenteric artery stents with some narrowing due to atherosclerotic plaque.  Marked duodenitis and mild peripancreatic fat stranding along the proximal pancreas noted.  Trace right pleural effusion seen.  Vascular surgery were consulted and have very low  suspicion for acute mesenteric ischemia.  Patient received 1 L normal saline, IV Zosyn, Zofran, morphine.  The hospitalist service was consulted to admit for further evaluation and management.  Review of Systems: All systems reviewed and are negative except as documented in history of present illness above.   Past Medical History:  Diagnosis Date   Anemia of chronic disease    Chronic atrial fibrillation ([place])    Chronic mesenteric ischemia (HCC)    Coronary artery disease    Hyperlipidemia associated with type 2 diabetes mellitus ([place])    Hypertension associated with diabetes (HCC)    OSA (obstructive sleep apnea)    Type 2 diabetes mellitus ([place])     Past Surgical History:  Procedure Laterality Date   IR AORTAGRAM ABDOMINAL SERIALOGRAM  09/15/2020   IR RADIOLOGIST EVAL & MGMT  07/25/2020   IR RADIOLOGIST EVAL & MGMT  08/28/2020   IR RADIOLOGIST EVAL & MGMT  10/22/2020   IR RADIOLOGIST EVAL & MGMT  01/08/2021   IR THROMBECT PRIM MECH INIT (INCLU) MOD SED  09/15/2020   IR TRANSCATH PLC STENT 1ST ART NOT LE CV CAR VERT CAR  09/15/2020   IR US GUIDE VASC ACCESS RIGHT  09/15/2020    Social History:  reports that he has quit smoking. His smoking use included cigarettes. He has never used smokeless tobacco. He reports that he does not currently use alcohol. He reports that he does not use drugs.  No Known Allergies  Family History  Problem Relation Age of Onset   Hypertension Father    Heart failure Father  Prior to Admission medications   Medication Sig Start Date End Date Taking? Authorizing Provider  acetaminophen (TYLENOL) 500 MG tablet Take 2 tablets (1,000 mg total) by mouth every 6 (six) hours as needed for mild pain (or Fever >/= 101). Patient not taking: Reported on 09/15/2020 09/12/20   Benjamin Danker, PA-C  aspirin EC 81 MG tablet Take 81 mg by mouth daily. Swallow whole.    [provider]  atorvastatin (LIPITOR) 20 MG tablet Take 10 mg by mouth daily. 06/24/20    [provider]  carvedilol (COREG) 12.5 MG tablet Take 12.5 mg by mouth 2 (two) times daily. 06/09/20   [provider]  clopidogrel (PLAVIX) 75 MG tablet Take 1 tablet (75 mg total) by mouth daily. 09/17/20   Theresa Duty, NP  Coenzyme Q10 100 MG capsule Take 100 mg by mouth daily.    [provider]  Cyanocobalamin 5000 MCG SUBL Place 5,000 mcg under the tongue daily. 10/28/15   [provider]  diltiazem (CARDIZEM CD) 240 MG 24 hr capsule Take 240 mg by mouth daily. 07/20/20   [provider]  furosemide (LASIX) 20 MG tablet Take 10 mg by mouth daily. 08/18/15   [provider]  lisinopril (ZESTRIL) 30 MG tablet Take 30 mg by mouth 2 (two) times daily. 07/16/20   [provider]  metFORMIN (GLUCOPHAGE) 1000 MG tablet Take 500 mg by mouth 2 (two) times daily. 06/09/20   [provider]  Multiple Vitamins-Minerals (MULTIVITAMIN WITH MINERALS) tablet Take 1 tablet by mouth daily. Patient not taking: Reported on 09/15/2020    [provider]  oxyCODONE (OXY IR/ROXICODONE) 5 MG immediate release tablet Take 1 tablet (5 mg total) by mouth every 4 (four) hours as needed for moderate pain. 09/12/20   Benjamin Danker, PA-C  pantoprazole (PROTONIX) 40 MG tablet Take 40 mg by mouth 2 (two) times daily. 04/12/20   [provider]  sertraline (ZOLOFT) 100 MG tablet Take 100 mg by mouth daily. 08/05/20   [provider]    Physical Exam: Vitals:   01/16/21 1600 01/16/21 1630 01/16/21 1631 01/16/21 1800  BP: (!) 154/92 (!) 142/83  (!) 153/92  Pulse: 91 (!) 105 79 74  Resp: 20     Temp:      TempSrc:      SpO2: (!) 86% 96% 98% 90%   Constitutional: Resting in bed, NAD, calm, comfortable Eyes: PERRL, lids and conjunctivae normal ENMT: Mucous membranes are moist. Posterior pharynx clear of any exudate or lesions.Normal dentition.  Neck: normal, supple, no masses. Respiratory: clear to auscultation  bilaterally, no wheezing, no crackles. Normal respiratory effort. No accessory muscle use.  Cardiovascular: Irregularly irregular, no murmurs / rubs / gallops.  +1 bilateral lower extremity edema. 2+ pedal pulses. Abdomen: Lower abdominal tenderness, no masses palpated. No hepatosplenomegaly. Bowel sounds hypoactive.  Musculoskeletal: no clubbing / cyanosis. No joint deformity upper and lower extremities. Good ROM, no contractures. Normal muscle tone.  Skin: no rashes, lesions, ulcers. No induration Neurologic: CN 2-12 grossly intact. Sensation intact. Strength 5/5 in all 4.  Psychiatric: Normal judgment and insight. Alert and oriented x 3. Normal mood.   Labs on Admission: I have personally reviewed following labs and imaging studies  CBC: Recent Labs  Lab 01/16/21 0805  WBC 11.6*  HGB 8.6*  HCT 29.7*  MCV 83.9  PLT 540   Basic Metabolic Panel: Recent Labs  Lab 01/16/21 0805  NA 139  K 3.5  CL 107  CO2 22  GLUCOSE 149*  BUN 19  CREATININE 0.83  CALCIUM 9.0   GFR: CrCl cannot be calculated (Unknown ideal weight.). Liver Function Tests: Recent Labs  Lab 01/16/21 0805  AST 15  ALT 11  ALKPHOS 68  BILITOT 0.8  PROT 7.4  ALBUMIN 3.6   Recent Labs  Lab 01/16/21 0805  LIPASE 445*   No results for input(s): AMMONIA in the last 168 hours. Coagulation Profile: No results for input(s): INR, PROTIME in the last 168 hours. Cardiac Enzymes: No results for input(s): CKTOTAL, CKMB, CKMBINDEX, TROPONINI in the last 168 hours. BNP (last 3 results) No results for input(s): PROBNP in the last 8760 hours. HbA1C: No results for input(s): HGBA1C in the last 72 hours. CBG: No results for input(s): GLUCAP in the last 168 hours. Lipid Profile: No results for input(s): CHOL, HDL, LDLCALC, TRIG, CHOLHDL, LDLDIRECT in the last 72 hours. Thyroid Function Tests: No results for input(s): TSH, T4TOTAL, FREET4, T3FREE, THYROIDAB in the last 72 hours. Anemia Panel: No results for  input(s): VITAMINB12, FOLATE, FERRITIN, TIBC, IRON, RETICCTPCT in the last 72 hours. Urine analysis:    Component Value Date/Time   COLORURINE YELLOW 01/16/2021 0749   APPEARANCEUR CLEAR 01/16/2021 0749   LABSPEC 1.015 01/16/2021 0749   PHURINE 5.0 01/16/2021 [place] 01/16/2021 0749   HGBUR NEGATIVE 01/16/2021 0749   BILIRUBINUR NEGATIVE 01/16/2021 0749   KETONESUR NEGATIVE 01/16/2021 0749   PROTEINUR NEGATIVE 01/16/2021 0749   NITRITE NEGATIVE 01/16/2021 0749   LEUKOCYTESUR NEGATIVE 01/16/2021 0749    Radiological Exams on Admission: CT Angio Abd/Pel W and/or Wo Contrast  Result Date: 01/16/2021 CLINICAL DATA:  Mesenteric ischemia, acute history of mesenteric ischemia s/p stent. acute abdominal pain EXAM: CTA ABDOMEN AND PELVIS WITHOUT AND WITH CONTRAST TECHNIQUE: Multidetector CT imaging of the abdomen and pelvis was performed using the standard protocol during bolus administration of intravenous contrast. Multiplanar reconstructed images and MIPs were obtained and reviewed to evaluate the vascular anatomy. CONTRAST:  168mL OMNIPAQUE IOHEXOL 350 MG/ML SOLN COMPARISON:  CT abdomen pelvis 09/15/2020 FINDINGS: VASCULAR No intravenous contrast extravasation to suggest active bleed. Aorta: Severe atherosclerotic plaque. Normal caliber aorta without aneurysm, dissection, vasculitis or significant stenosis. Celiac: Stent of the origin of the celiac artery is patent but narrowed due to atherosclerotic plaque. Otherwise patent without evidence of aneurysm, dissection, vasculitis or significant stenosis. SMA: Extent of the origin of the superior mesenteric artery is patent but narrowed due to atherosclerotic plaque. Patent without evidence of aneurysm, dissection, vasculitis or significant stenosis. Renals: At least mild atherosclerotic plaque. Both renal arteries are patent without evidence of aneurysm, dissection, vasculitis, fibromuscular dysplasia or significant stenosis. IMA:  Patent without evidence of aneurysm, dissection, vasculitis or significant stenosis. Inflow: Mild to moderate atherosclerotic plaque. Patent without evidence of aneurysm, dissection, vasculitis or significant stenosis. Proximal Outflow: Bilateral common femoral and visualized portions of the superficial and profunda femoral arteries are patent without evidence of aneurysm, dissection, vasculitis or significant stenosis. Veins: The main portal, splenic, superior mesenteric veins are patent. No portal or mesenteric venous gas. Review of the MIP images confirms the above findings. NON-VASCULAR Lower chest: Trace volume right pleural effusion. Coronary artery calcifications. Hepatobiliary: No focal liver abnormality. No gallstones, gallbladder wall thickening, or pericholecystic fluid. No biliary dilatation. Pancreas: No focal lesion. Normal pancreatic contour. Mild peripancreatic fat stranding along the proximal pancreas which may be due to the adjacent duodenum. No main pancreatic ductal dilatation. Spleen: Normal in size without focal abnormality. Punctate calcifications  likely sequelae of prior granulomatous disease. Adrenals/Urinary Tract: No adrenal nodule bilaterally. Bilateral kidneys enhance symmetrically. Fluid density lesions likely represent simple renal cysts. Subcentimeter hypodensities are too small to characterize. No hydronephrosis. No hydroureter. The urinary bladder is unremarkable. Stomach/Bowel: Circumferential diffuse bowel wall thickening of the 2nd-4th portion of the duodenum with associated marked duodenal fat stranding and surrounding mesenteric edema. No pneumatosis. The duodenal bowel wall does not enhance well; this however may be due to timing of contrast on the portal venous phase. No bowel dilatation. The large bowel demonstrates no bowel dilatation or bowel wall thickening. The appendix is unremarkable. Lymphatic: No lymphadenopathy. Reproductive: Prostate is unremarkable. Other: Trace  free fluid within the abdomen pelvis. No intraperitoneal free gas. No organized fluid collection. Musculoskeletal: No abdominal wall hernia or abnormality. No suspicious lytic or blastic osseous lesions. No acute displaced fracture. Multilevel mild degenerative changes of the spine. IMPRESSION: VASCULAR 1. Patent celiac and superior mesenteric artery stents with some narrowing due to atherosclerotic plaque. 2. Aortic Atherosclerosis (ICD10-I70.0) including coronary artery calcifications. 3. No intravenous contrast extravasation to suggest active bleed. NON-VASCULAR 1. Marked duodenitis. Bowel ischemia is not excluded given extent of inflammation and poorly enhancing duodenal wall - this may be due to timing of contrast on the portal venous phase. Correlate with lactate levels. 2. Mild peripancreatic fat stranding along the proximal pancreas which may be due to the adjacent duodenal inflammation with no definite hazy pancreatic contour and no poorly defined/edematous pancreatic parenchyma. 3. Trace right pleural effusion. These results were called by telephone at the time of interpretation on 01/16/2021 at 5:41 pm to provider Dr. Sabra Heck, who verbally acknowledged these results. Electronically Signed   By: Iven Finn Fox.D.   On: 01/16/2021 17:52    EKG: Not performed.  Assessment/Plan Principal Problem:   Acute pancreatitis Active Problems:   Chronic atrial fibrillation (HCC)   Coronary artery disease   Hyperlipidemia associated with type 2 diabetes mellitus ([place])   Hypertension associated with diabetes ([place])   OSA (obstructive sleep apnea)   Chronic mesenteric ischemia ([place])   Type 2 diabetes mellitus ([place])   Duodenitis   Corinthian Mizrahi is a 65 y.o. male with medical history significant for chronic atrial fibrillation on Xarelto, chronic mesenteric ischemia (s/p celiac and SMA stenting and subsequent thrombectomy for occluded stents 09/15/2020), CAD s/p RCA stenting, T2DM, HTN, HLD, anemia of  chronic disease, depression/anxiety, and OSA on CPAP who is admitted with acute pancreatitis and duodenitis.  Acute pancreatitis/duodenitis: Patient presenting with epigastric pain associated with nausea and vomiting.  Lipase 445 on admission.  CTA A/P shows changes consistent with duodenitis and pancreatitis.  No gallstones, gallbladder wall thickening, or biliary dilatation seen.  Patient denies any alcohol use. -Keep n.p.o. tonight -Continue analgesics and antiemetics as needed -Continue IV fluid hydration overnight  Chronic mesenteric ischemia: Follows closely with IR s/p celiac and SMA stenting. IR and vascular surgery have evaluated patient while in the ED. CTA largely reassuring from vascular standpoint. -Continue Plavix, Xarelto, statin  Chronic atrial fibrillation: Remains in atrial fibrillation.  Continue Xarelto and diltiazem.  CAD s/p RCA stenting: Denies any chest pain.  Continue aspirin, statin, Coreg.  Anemia of chronic disease: Hemoglobin stable, denies any obvious bleeding.  Continue to monitor.  Type 2 diabetes: Hold home metformin.  Place on sensitive SSI every 4 hours while NPO.  Hypertension: Resume home Coreg, diltiazem, lisinopril.  Hyperlipidemia: Continue atorvastatin.  Depression/anxiety: Continue sertraline.  OSA: Continue CPAP nightly.   DVT prophylaxis: Xarelto Code  Status: Full code, confirmed with patient on admission Family Communication: Discussed with patient, he has discussed with family Disposition Plan: From home and likely discharge to home pending clinical progress Consults called: IR, vascular surgery Level of care: Telemetry Cardiac Admission status:  Status is: Observation  The patient remains OBS appropriate and will d/c before 2 midnights.   Zada Finders MD [facility]  If 7PM-7AM, please contact night-coverage [email or web address]  01/16/2021, 8:20 PM

## 2021-01-16 NOTE — ED Triage Notes (Signed)
Pt.stated, I had a stent a month ago that goes to my stomach and last night I started having stomach pain with some throwing up but I think that was from the oxycontin.

## 2021-01-16 NOTE — ED Notes (Signed)
Patient just vomited on self and floor.  Gown and linens changed

## 2021-01-16 NOTE — Progress Notes (Signed)
65 y.o. male in the ED. Former smoker (quit in 2017) History of  a fib, chronic mesenteric ischemia s/p celiac and superior mesenteric artery stent placement in March 2022 at outside facility. 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 These complications resolved spontaneously however the patient's abdominal pain persisted in the patient developed acute complicated appendicitis, therefore underwent SMA stent recanalization with laser atherectomy and covered stent relining on 09/15/2020 with Dr. Serafina Royals. Seen in IR clinic on 12.15.22 Mesenteric Duplex (01/08/21) reads Patent SMA stent with velocities of 287 cm/s within the stent. Patent mid and distal SMA.  Occluded celiac stent with patent distal celiac.     Patient presented to the ED at [facility] on persistent and worsening RLQ pain that started the evening of 12.22.22 before dinner ( 2000). Patient describes that pain as not radiating and states that is similar to pancreatitis pain that the SMA issues which he describes as generalized lower abdominal pain with food aversion. Mr. Smoak states that he vomiting once yesterday after he dinner and three times today. The last time being around 1600 which contained contents from his dinner last night.   WBC 11.6, Lipase 445. Patient is on Plavix 75 mg and Xarelto 10 mg. CTA and pelvis ordered.   Anticipate patient be admitted to inpatient. IR continue to follow.

## 2021-01-16 NOTE — ED Notes (Signed)
The patient had an episode of emesis. This EMT gave the patient's family more emesis bags and tissues. This EMT informed the family member that the staff is working to get the patient into a room

## 2021-01-16 NOTE — ED Provider Notes (Signed)
[facility] EMERGENCY DEPARTMENT Provider Note   CSN: [id number] Arrival date & time: 01/16/21  0749     History Chief Complaint  Patient presents with   Abdominal Pain   Emesis    Benjamin Fox is a 65 y.o. male.  65 year old male with past medical history of mesenteric ischemia status post stent placement with recent recannulization around mid August presents today for evaluation of abdominal pain and nausea vomiting since last night.  Patient denies any fever, chills, constipation, dysuria, shortness of breath, or chest pain.  Patient reports his pain started just prior to supper.  He reports he was able to eat his supper but woke up at about 3:30 in the morning with episode of emesis.  Patient presents with his wife.    The history is provided by the patient. No language interpreter was used.  Emesis Associated symptoms: abdominal pain   Associated symptoms: no chills and no fever       No past medical history on file.  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   Abnormal gait 06/29/2020   Intra-abdominal abscess ([place]) 05/08/2020   Mesenteric ischemia due to arterial insufficiency ([place]) 04/23/2020   History of pancreatitis 03/07/2020   Anemia of chronic disease 06/10/2019   Anxiety 06/10/2019   Hyperlipidemia 06/10/2019   Idiopathic acute pancreatitis without necrosis or infection 04/16/2019   Atrial fibrillation ([place]) 11/02/2011   CAD (coronary artery disease) 11/02/2011   Hypertension 11/02/2011   Sleep apnea 01/05/2011   LACERATION, RIGHT THUMB 11/04/2010    Past Surgical History:  Procedure Laterality Date   IR AORTAGRAM ABDOMINAL  SERIALOGRAM  09/15/2020   IR RADIOLOGIST EVAL & MGMT  07/25/2020   IR RADIOLOGIST EVAL & MGMT  08/28/2020   IR RADIOLOGIST EVAL & MGMT  10/22/2020   IR RADIOLOGIST EVAL & MGMT  01/08/2021   IR THROMBECT PRIM MECH INIT (INCLU) MOD SED  09/15/2020   IR TRANSCATH PLC STENT 1ST ART NOT LE CV CAR VERT CAR  09/15/2020   IR US GUIDE VASC ACCESS RIGHT  09/15/2020       No family history on file.  Social History   Tobacco Use   Smoking status: Former    Types: Cigarettes   Smokeless tobacco: Never  Vaping Use   Vaping Use: Never used  Substance Use Topics   Alcohol use: Not Currently   Drug use: Never    Home Medications Prior to Admission medications   Medication Sig Start Date End Date Taking? Authorizing Provider  acetaminophen (TYLENOL) 500 MG tablet Take 2 tablets (1,000 mg total) by mouth every 6 (six) hours as needed for mild pain (or Fever >/= 101). Patient not taking: Reported on 09/15/2020 09/12/20   Saverio Danker, PA-C  aspirin EC 81 MG tablet Take 81 mg by mouth daily. Swallow whole.    [provider]  atorvastatin (LIPITOR) 20 MG tablet Take 10 mg by mouth daily. 06/24/20   [provider]  carvedilol (COREG) 12.5 MG tablet Take 12.5 mg by mouth 2 (two) times daily. 06/09/20   [provider]  clopidogrel (PLAVIX) 75 MG tablet Take 1 tablet (75 mg total) by mouth daily. 09/17/20   Theresa Duty, NP  Coenzyme Q10 100 MG capsule Take 100 mg by mouth daily.    [provider]  Cyanocobalamin 5000 MCG SUBL Place 5,000 mcg under the tongue daily. 10/28/15   [provider]  diltiazem (CARDIZEM CD) 240 MG 24 hr capsule Take 240 mg by mouth daily. 07/20/20   [provider]  furosemide (LASIX) 20 MG tablet Take 10 mg by mouth daily. 08/18/15   [provider]  lisinopril (ZESTRIL) 30 MG tablet Take 30 mg by mouth 2 (two) times daily. 07/16/20   [provider]  metFORMIN (GLUCOPHAGE) 1000 MG tablet Take 500 mg by mouth  2 (two) times daily. 06/09/20   [provider]  Multiple Vitamins-Minerals (MULTIVITAMIN WITH MINERALS) tablet Take 1 tablet by mouth daily. Patient not taking: Reported on 09/15/2020    [provider]  oxyCODONE (OXY IR/ROXICODONE) 5 MG immediate release tablet Take 1 tablet (5 mg total) by mouth every 4 (four) hours as needed for moderate pain. 09/12/20   Saverio Danker, PA-C  pantoprazole (PROTONIX) 40 MG tablet Take 40 mg by mouth 2 (two) times daily. 04/12/20   [provider]  sertraline (ZOLOFT) 100 MG tablet Take 100 mg by mouth daily. 08/05/20   [provider]    Allergies    Patient has no known allergies.  Review of Systems   Review of Systems  Constitutional:  Negative for activity change, chills and fever.  Respiratory:  Negative for shortness of breath.   Gastrointestinal:  Positive for abdominal pain, nausea and vomiting.  Genitourinary:  Negative for difficulty urinating and dysuria.  Musculoskeletal:  Positive for back pain.  Neurological:  Negative for weakness and light-headedness.  All other systems reviewed and are negative.  Physical Exam Updated Vital Signs BP (!) 178/94 (BP Location: Left Arm)    Pulse 84    Temp 98 F (36.7 C) (Oral)    Resp 16    SpO2 98%   Physical Exam Vitals and nursing note reviewed.  Constitutional:      General: He is not in acute distress.    Appearance: Normal appearance. He is not ill-appearing.  HENT:     Head: Normocephalic and atraumatic.     Nose: Nose normal.  Eyes:     General: No scleral icterus.    Extraocular Movements: Extraocular movements intact.     Conjunctiva/sclera: Conjunctivae normal.  Cardiovascular:     Rate and Rhythm: Normal rate and regular rhythm.     Pulses: Normal pulses.     Heart sounds: Normal heart sounds.  Pulmonary:     Effort: Pulmonary effort is normal. No respiratory distress.     Breath sounds: Normal breath sounds. No wheezing or rales.  Abdominal:      General: There is no distension.     Palpations: Abdomen is soft.     Tenderness: There is abdominal tenderness (epigastric, RUQ, and LUQ). There is guarding.  Musculoskeletal:        General: Normal range of motion.     Cervical back: Normal range of motion.     Right lower leg: No edema.     Left lower leg: No edema.  Skin:    General: Skin is warm and dry.  Neurological:     General: No focal deficit present.     Mental Status: He is alert. Mental status is at baseline.    ED Results / Procedures / Treatments   Labs (all labs ordered are listed, but only abnormal results are  displayed) Labs Reviewed  LIPASE, BLOOD - Abnormal; Notable for the following components:      Result Value   Lipase 445 (*)    All other components within normal limits  COMPREHENSIVE METABOLIC PANEL - Abnormal; Notable for the following components:   Glucose, Bld 149 (*)    All other components within normal limits  CBC - Abnormal; Notable for the following components:   WBC 11.6 (*)    RBC 3.54 (*)    Hemoglobin 8.6 (*)    HCT 29.7 (*)    MCH 24.3 (*)    MCHC 29.0 (*)    RDW 18.6 (*)    All other components within normal limits  URINALYSIS, ROUTINE W REFLEX MICROSCOPIC    EKG None  Radiology No results found.  Procedures Procedures   Medications Ordered in ED Medications  ondansetron (ZOFRAN) injection 4 mg (has no administration in time range)  morphine 4 MG/ML injection 4 mg (has no administration in time range)    ED Course  I have reviewed the triage vital signs and the nursing notes.  Pertinent labs & imaging results that were available during my care of the patient were reviewed by me and considered in my medical decision making (see chart for details).    MDM Rules/Calculators/A&P                         This patient presents to the ED for concern of abdominal pain, nausea, vomiting, this involves an extensive number of treatment options, and is a complaint that carries  with it a high risk of complications and morbidity.  The differential diagnosis includes pancreatitis, cholecystitis, appendicitis, bowel ischemia including other acute intra-abdominal processes   Additional history obtained:  Additional history obtained from wife who was at bedside External records from outside source obtained and reviewed including recent visit with interventional radiology on 12/15, previous admissions including August 2022   Lab Tests:  I Ordered, reviewed, and interpreted labs.  The pertinent results include: CBC with mild leukocytosis of 11.6, anemia with hemoglobin 8.6 which appears to be around his baseline, CMP which is unremarkable with the exception of glucose of 149, lipase 445.  Initial lactic acid 1.1   Imaging Studies ordered:  I ordered imaging studies including CTA abdomen pelvis I independently visualized and interpreted and discussed imaging with radiologist which showed concern for bowel ischemia with significant inflammation of the duodenum.  Demonstrated patent SMA stent I agree with the radiologist interpretation   Cardiac Monitoring:  The patient was maintained on a cardiac monitor.  I personally viewed and interpreted the cardiac monitored which showed an underlying rhythm of: Normal sinus rhythm   Medicines ordered and prescription drug management:  I ordered medication including IV fluids, morphine, Zofran for abdominal pain/pancreatitis.  Zosyn given for coverage of potential abdominal infection. Reevaluation of the patient after these medicines showed that the patient improved I have reviewed the patients home medicines and have made adjustments as needed   Consultations Obtained:  I requested consultation with the interventional radiology, vascular surgeon,  and discussed lab and imaging findings as well as pertinent plan - they recommend:  Interventional radiology recommended obtaining CT angiogram of abdomen pelvis with contrast,  but given he has had multiple stents placed that he likely will not benefit from additional IR intervention and will likely require vascular surgery consult.  IR note placed, and they will follow.  Vascular surgery consulted who reviewed CT findings  which showed patent SMA and celiac stent.  CT findings consistent with duodenitis and pancreatitis.  They recommend hospitalist admission with medical management.  No surgical intervention required from their standpoint.  Reevaluation:  After the interventions noted above, I reevaluated the patient and found that they have :improved   Dispostion:  After consideration of the diagnostic results and the patients response to treatment feel that the patent would benefit from admission.  Patient received multiple rounds of pain medication with improvement in pain.  Case discussed with hospitalist Dr. Posey Pronto who will evaluate patient for admission.     Final Clinical Impression(s) / ED Diagnoses Final diagnoses:  Acute pancreatitis, unspecified complication status, unspecified pancreatitis type  Duodenitis    Rx / DC Orders ED Discharge Orders     None        Evlyn Courier, [name] 01/16/21 2012    Carmin Muskrat, MD 01/19/21 2009

## 2021-01-17 ENCOUNTER — Other Ambulatory Visit: Payer: Self-pay

## 2021-01-17 DIAGNOSIS — Z20822 Contact with and (suspected) exposure to covid-19: Secondary | ICD-10-CM | POA: Diagnosis present

## 2021-01-17 DIAGNOSIS — G4733 Obstructive sleep apnea (adult) (pediatric): Secondary | ICD-10-CM

## 2021-01-17 DIAGNOSIS — Z7982 Long term (current) use of aspirin: Secondary | ICD-10-CM | POA: Diagnosis not present

## 2021-01-17 DIAGNOSIS — K2980 Duodenitis without bleeding: Secondary | ICD-10-CM | POA: Diagnosis present

## 2021-01-17 DIAGNOSIS — Z8249 Family history of ischemic heart disease and other diseases of the circulatory system: Secondary | ICD-10-CM | POA: Diagnosis not present

## 2021-01-17 DIAGNOSIS — D62 Acute posthemorrhagic anemia: Secondary | ICD-10-CM | POA: Diagnosis present

## 2021-01-17 DIAGNOSIS — E1159 Type 2 diabetes mellitus with other circulatory complications: Secondary | ICD-10-CM

## 2021-01-17 DIAGNOSIS — Z7901 Long term (current) use of anticoagulants: Secondary | ICD-10-CM | POA: Diagnosis not present

## 2021-01-17 DIAGNOSIS — I4819 Other persistent atrial fibrillation: Secondary | ICD-10-CM | POA: Diagnosis present

## 2021-01-17 DIAGNOSIS — F32A Depression, unspecified: Secondary | ICD-10-CM | POA: Diagnosis present

## 2021-01-17 DIAGNOSIS — K551 Chronic vascular disorders of intestine: Secondary | ICD-10-CM

## 2021-01-17 DIAGNOSIS — E785 Hyperlipidemia, unspecified: Secondary | ICD-10-CM | POA: Diagnosis present

## 2021-01-17 DIAGNOSIS — I152 Hypertension secondary to endocrine disorders: Secondary | ICD-10-CM | POA: Diagnosis present

## 2021-01-17 DIAGNOSIS — Z7902 Long term (current) use of antithrombotics/antiplatelets: Secondary | ICD-10-CM | POA: Diagnosis not present

## 2021-01-17 DIAGNOSIS — Z87891 Personal history of nicotine dependence: Secondary | ICD-10-CM | POA: Diagnosis not present

## 2021-01-17 DIAGNOSIS — I482 Chronic atrial fibrillation, unspecified: Secondary | ICD-10-CM

## 2021-01-17 DIAGNOSIS — D72829 Elevated white blood cell count, unspecified: Secondary | ICD-10-CM | POA: Diagnosis present

## 2021-01-17 DIAGNOSIS — K859 Acute pancreatitis without necrosis or infection, unspecified: Secondary | ICD-10-CM | POA: Diagnosis present

## 2021-01-17 DIAGNOSIS — Z7984 Long term (current) use of oral hypoglycemic drugs: Secondary | ICD-10-CM | POA: Diagnosis not present

## 2021-01-17 DIAGNOSIS — F419 Anxiety disorder, unspecified: Secondary | ICD-10-CM | POA: Diagnosis present

## 2021-01-17 DIAGNOSIS — E1165 Type 2 diabetes mellitus with hyperglycemia: Secondary | ICD-10-CM | POA: Diagnosis present

## 2021-01-17 DIAGNOSIS — D509 Iron deficiency anemia, unspecified: Secondary | ICD-10-CM | POA: Diagnosis present

## 2021-01-17 DIAGNOSIS — D72825 Bandemia: Secondary | ICD-10-CM | POA: Diagnosis present

## 2021-01-17 DIAGNOSIS — E1169 Type 2 diabetes mellitus with other specified complication: Secondary | ICD-10-CM | POA: Diagnosis present

## 2021-01-17 DIAGNOSIS — I2510 Atherosclerotic heart disease of native coronary artery without angina pectoris: Secondary | ICD-10-CM | POA: Diagnosis present

## 2021-01-17 DIAGNOSIS — D638 Anemia in other chronic diseases classified elsewhere: Secondary | ICD-10-CM | POA: Diagnosis present

## 2021-01-17 LAB — LACTIC ACID, PLASMA
Lactic Acid, Venous: 1 mmol/L (ref 0.5–1.9)
Lactic Acid, Venous: 1.2 mmol/L (ref 0.5–1.9)

## 2021-01-17 LAB — COMPREHENSIVE METABOLIC PANEL
ALT: 9 U/L (ref 0–44)
AST: 14 U/L — ABNORMAL LOW (ref 15–41)
Albumin: 3.4 g/dL — ABNORMAL LOW (ref 3.5–5.0)
Alkaline Phosphatase: 57 U/L (ref 38–126)
Anion gap: 8 (ref 5–15)
BUN: 16 mg/dL (ref 8–23)
CO2: 22 mmol/L (ref 22–32)
Calcium: 8.5 mg/dL — ABNORMAL LOW (ref 8.9–10.3)
Chloride: 108 mmol/L (ref 98–111)
Creatinine, Ser: 0.79 mg/dL (ref 0.61–1.24)
GFR, Estimated: >60 mL/min (ref >60)
Glucose, Bld: 125 mg/dL — ABNORMAL HIGH (ref 70–99)
Potassium: 3.4 mmol/L — ABNORMAL LOW (ref 3.5–5.1)
Sodium: 138 mmol/L (ref 135–145)
Total Bilirubin: 1 mg/dL (ref 0.3–1.2)
Total Protein: 7 g/dL (ref 6.5–8.1)

## 2021-01-17 LAB — CBC
HCT: 28.3 % — ABNORMAL LOW (ref 39.0–52.0)
Hemoglobin: 8.1 g/dL — ABNORMAL LOW (ref 13.0–17.0)
MCH: 24.1 pg — ABNORMAL LOW (ref 26.0–34.0)
MCHC: 28.6 g/dL — ABNORMAL LOW (ref 30.0–36.0)
MCV: 84.2 fL (ref 80.0–100.0)
Platelets: 263 10*3/uL (ref 150–400)
RBC: 3.36 MIL/uL — ABNORMAL LOW (ref 4.22–5.81)
RDW: 18.9 % — ABNORMAL HIGH (ref 11.5–15.5)
WBC: 10.6 10*3/uL — ABNORMAL HIGH (ref 4.0–10.5)
nRBC: 0 % (ref 0.0–0.2)

## 2021-01-17 LAB — CBG MONITORING, ED
Glucose-Capillary: 140 mg/dL — ABNORMAL HIGH (ref 70–99)
Glucose-Capillary: 125 mg/dL — ABNORMAL HIGH (ref 70–99)
Glucose-Capillary: 135 mg/dL — ABNORMAL HIGH (ref 70–99)

## 2021-01-17 LAB — LIPASE, BLOOD: Lipase: 129 U/L — ABNORMAL HIGH (ref 11–51)

## 2021-01-17 LAB — GLUCOSE, CAPILLARY
Glucose-Capillary: 118 mg/dL — ABNORMAL HIGH (ref 70–99)
Glucose-Capillary: 130 mg/dL — ABNORMAL HIGH (ref 70–99)
Glucose-Capillary: 131 mg/dL — ABNORMAL HIGH (ref 70–99)

## 2021-01-17 LAB — MAGNESIUM: Magnesium: 1.7 mg/dL (ref 1.7–2.4)

## 2021-01-17 MED ORDER — OXYCODONE HCL 5 MG PO TABS
10.0000 mg | ORAL_TABLET | Freq: Four times a day (QID) | ORAL | Status: DC | PRN
  Administered 2021-01-17 – 2021-01-21 (×9): 10 mg via ORAL
  Filled 2021-01-17 (×9): qty 2

## 2021-01-17 MED ORDER — KETOROLAC TROMETHAMINE 15 MG/ML IJ SOLN
15.0000 mg | Freq: Three times a day (TID) | INTRAMUSCULAR | Status: DC
  Administered 2021-01-17 – 2021-01-19 (×8): 15 mg via INTRAVENOUS
  Filled 2021-01-17 (×8): qty 1

## 2021-01-17 MED ORDER — POTASSIUM CHLORIDE CRYS ER 20 MEQ PO TBCR
40.0000 meq | EXTENDED_RELEASE_TABLET | ORAL | Status: AC
  Administered 2021-01-17 (×2): 40 meq via ORAL
  Filled 2021-01-17 (×2): qty 2

## 2021-01-17 MED ORDER — HYDROMORPHONE HCL 1 MG/ML IJ SOLN
1.0000 mg | INTRAMUSCULAR | Status: DC | PRN
  Administered 2021-01-18: 17:00:00 1 mg via INTRAVENOUS
  Filled 2021-01-17: qty 1

## 2021-01-17 MED ORDER — LACTATED RINGERS IV SOLN: INTRAVENOUS | Status: DC

## 2021-01-17 MED ORDER — PANTOPRAZOLE SODIUM 40 MG IV SOLR
40.0000 mg | Freq: Two times a day (BID) | INTRAVENOUS | Status: DC
  Administered 2021-01-17 – 2021-01-20 (×8): 40 mg via INTRAVENOUS
  Filled 2021-01-17 (×8): qty 40

## 2021-01-17 MED ORDER — MAGNESIUM SULFATE 2 GM/50ML IV SOLN
2.0000 g | Freq: Once | INTRAVENOUS | Status: AC
  Administered 2021-01-17: 14:00:00 2 g via INTRAVENOUS
  Filled 2021-01-17: qty 50

## 2021-01-17 MED ORDER — SODIUM CHLORIDE 0.9 % IV SOLN
3.0000 g | Freq: Four times a day (QID) | INTRAVENOUS | Status: DC
  Administered 2021-01-17 – 2021-01-18 (×5): 3 g via INTRAVENOUS
  Filled 2021-01-17 (×6): qty 8

## 2021-01-17 NOTE — Progress Notes (Signed)
PROGRESS NOTE  [name] VPX:[id number] DOB: [birth year]   PCP: [place], Julie M, NP  Patient is from: Home.  Independent at baseline.  DOA: 01/16/2021 LOS: 0  Chief complaints:  Chief Complaint  Patient presents with   Abdominal Pain   Emesis     Brief Narrative / Interim history: 65 year old M with PMH of pancreatitis, chronic A. fib on Xarelto, chronic mesenteric ischemia s/p celiac and SMA stenting and subsequent thrombectomy for occluded stents 09/15/2020, CAD s/p RCA stenting, T2DM, HTN, HLD, anemia of chronic disease, depression/anxiety, and OSA on CPAP who presented to the ED for evaluation of abdominal pain, and admitted for acute uncomplicated pancreatitis.  Denies history of EtOH.  LFT within normal.  IR consulted and recommended CTA.  CTA abdomen and pelvis showed patent celiac and SMA stents with some narrowing due to atherosclerotic plaque, marked duodenitis and mild peripancreatic fat stranding along the proximal pancreas.  VVS  consulted and have very low suspicion for acute mesenteric ischemia.  Patient received IV Zosyn, and started on IV fluid, Zofran and morphine and admitted.  Subjective: Seen and examined earlier this morning.  Continues to endorse severe periumbilical pain and nausea.  No further emesis.  No bowel movement since yesterday.  He denies fever, chills, chest pain or dyspnea.  Objective: Vitals:   01/16/21 2353 01/17/21 0128 01/17/21 0400 01/17/21 0850  BP: (!) 176/110  (!) 166/98 (!) 166/95  Pulse: 94  95 (!) 58  Resp: 16  18 18   Temp:   99 F (37.2 C) 98.4 F (36.9 C)  TempSrc:   Oral Oral  SpO2: 94%  92% 93%  Weight:  117.9 kg    Height:  6\' 1"  (1.854 m)      Examination:  GENERAL: No apparent distress.  Nontoxic. HEENT: MMM.  Vision and hearing grossly intact.  NECK: Supple.  No apparent JVD.  RESP: 93% on RA.  No IWOB.  Fair aeration bilaterally. CVS:  RRR. Heart sounds normal.  ABD/GI/GU: BS+. Abd soft.  Diffuse tenderness.   MSK/EXT:  Moves extremities. No apparent deformity. No edema.  SKIN: no apparent skin lesion or wound NEURO: Awake, alert and oriented appropriately.  No apparent focal neuro deficit. PSYCH: Calm. Normal affect.   Procedures:  None  Microbiology summarized: IOEVO-35 and influenza PCR nonreactive.  Assessment & Plan: Acute pancreatitis/duodenitis: Noted on CTA.  LFT within normal.  Denies alcohol.  Lipase improved from 445-129 but persistent severe abdominal pain.  Recent triglycerides 62.  Calcium within normal. -Continue n.p.o. except sips with meds and ice chips -Discontinue IV morphine.  IV Dilaudid 1 mg every 3 hours as needed for severe pain -P.o. oxycodone 10 mg every 4 hours as needed moderate pain -IV Toradol 50 mg every 8 hours -IV Protonix 40 mg twice daily for possible gastritis. -Continue IV fluid -Add IV Unasyn for duodenitis -Check lactic acid   Chronic mesenteric ischemia: s/p celiac and SMA stenting. IR and vascular surgery have evaluated patient while in the ED. CTA largely reassuring from vascular standpoint. -Continue Plavix, Xarelto, statin   Persistent atrial fibrillation: Irregular rhythm but rate controlled. -Continue Cardizem and Xarelto   CAD s/p RCA stenting: No cardiopulmonary symptoms. -Continue aspirin, statin, Coreg.   Anemia of chronic disease: H&H relatively stable. Recent Labs    09/10/20 1324 09/11/20 0336 09/15/20 0630 09/15/20 1847 09/16/20 0350 01/16/21 0805 01/17/21 0524  HGB 8.8* 8.4* 9.2* 9.8* 8.3* 8.6* 8.1*  -Monitor  DM-2 with hyperglycemia and hyperlipidemia: On metformin at home.  Recent Labs  Lab 01/16/21 2350 01/17/21 0139 01/17/21 0345 01/17/21 0847 01/17/21 1225  GLUCAP 127* 140* 125* 135* 118*  -Check hemoglobin A1c -Continue SSI and statin.  Hypertension: BP elevated partly from pain and IV fluid. -Continue home Coreg, diltiazem, lisinopril.   Depression/anxiety: Stable. -Continue sertraline.    OSA: -Continue CPAP nightly.  Hypokalemia: K3.4.  Mg 1.7. -P.o. KCl 40x2 -IV magnesium 2 g x 1  Leukocytosis/bandemia: Improved. -Antibiotics as above  Body mass index is 34.3 kg/m.         DVT prophylaxis:   rivaroxaban (XARELTO) tablet 20 mg  Code Status: Full code Family Communication: Patient and/or RN. Available if any question.  Level of care: Telemetry Cardiac Status is: Observation  The patient will require care spanning > 2 midnights and should be moved to inpatient because: Acute pancreatitis with severe pain requiring IV pain medication and IV fluid, duodenitis requiring IV antibiotics   Final disposition: Likely home once medically stable.   Consultants:  Interventional radiology Vascular surgery   Sch Meds:  Scheduled Meds:  atorvastatin  5 mg Oral BID   carvedilol  25 mg Oral BID   clopidogrel  75 mg Oral q morning   diltiazem  240 mg Oral q morning   insulin aspart  0-9 Units Subcutaneous Q4H   ketorolac  15 mg Intravenous Q8H   lisinopril  30 mg Oral BID   pantoprazole (PROTONIX) IV  40 mg Intravenous Q12H   rivaroxaban  20 mg Oral Q supper   sertraline  100 mg Oral QPM   sodium chloride flush  3 mL Intravenous Q12H   Continuous Infusions:  ampicillin-sulbactam (UNASYN) IV     lactated ringers 125 mL/hr at 01/17/21 0934   magnesium sulfate bolus IVPB     PRN Meds:.HYDROmorphone (DILAUDID) injection, ondansetron **OR** ondansetron (ZOFRAN) IV, oxyCODONE  Antimicrobials: Anti-infectives (From admission, onward)    Start     Dose/Rate Route Frequency Ordered Stop   01/17/21 1430  Ampicillin-Sulbactam (UNASYN) 3 g in sodium chloride 0.9 % 100 mL IVPB        3 g 200 mL/hr over 30 Minutes Intravenous Every 6 hours 01/17/21 1339 01/22/21 1429   01/16/21 1930  piperacillin-tazobactam (ZOSYN) IVPB 3.375 g        3.375 g 100 mL/hr over 30 Minutes Intravenous  Once 01/16/21 1923 01/16/21 2304        I have personally reviewed the following  labs and images: CBC: Recent Labs  Lab 01/16/21 0805 01/17/21 0524  WBC 11.6* 10.6*  HGB 8.6* 8.1*  HCT 29.7* 28.3*  MCV 83.9 84.2  PLT 258 263   BMP &GFR Recent Labs  Lab 01/16/21 0805 01/17/21 0524 01/17/21 0930  NA 139 138  --   K 3.5 3.4*  --   CL 107 108  --   CO2 22 22  --   GLUCOSE 149* 125*  --   BUN 19 16  --   CREATININE 0.83 0.79  --   CALCIUM 9.0 8.5*  --   MG  --   --  1.7   Estimated Creatinine Clearance: 120.5 mL/min (by C-G formula based on SCr of 0.79 mg/dL). Liver & Pancreas: Recent Labs  Lab 01/16/21 0805 01/17/21 0524  AST 15 14*  ALT 11 9  ALKPHOS 68 57  BILITOT 0.8 1.0  PROT 7.4 7.0  ALBUMIN 3.6 3.4*   Recent Labs  Lab 01/16/21 0805 01/17/21 0930  LIPASE 445* 129*   No results for  input(s): AMMONIA in the last 168 hours. Diabetic: No results for input(s): HGBA1C in the last 72 hours. Recent Labs  Lab 01/16/21 2350 01/17/21 0139 01/17/21 0345 01/17/21 0847 01/17/21 1225  GLUCAP 127* 140* 125* 135* 118*   Cardiac Enzymes: No results for input(s): CKTOTAL, CKMB, CKMBINDEX, TROPONINI in the last 168 hours. No results for input(s): PROBNP in the last 8760 hours. Coagulation Profile: No results for input(s): INR, PROTIME in the last 168 hours. Thyroid Function Tests: No results for input(s): TSH, T4TOTAL, FREET4, T3FREE, THYROIDAB in the last 72 hours. Lipid Profile: No results for input(s): CHOL, HDL, LDLCALC, TRIG, CHOLHDL, LDLDIRECT in the last 72 hours. Anemia Panel: No results for input(s): VITAMINB12, FOLATE, FERRITIN, TIBC, IRON, RETICCTPCT in the last 72 hours. Urine analysis:    Component Value Date/Time   COLORURINE YELLOW 01/16/2021 [place] 01/16/2021 0749   LABSPEC 1.015 01/16/2021 0749   PHURINE 5.0 01/16/2021 [place] 01/16/2021 0749   HGBUR NEGATIVE 01/16/2021 0749   BILIRUBINUR NEGATIVE 01/16/2021 0749   [place] 01/16/2021 0749   PROTEINUR NEGATIVE 01/16/2021  0749   NITRITE NEGATIVE 01/16/2021 0749   LEUKOCYTESUR NEGATIVE 01/16/2021 0749   Sepsis Labs: Invalid input(s): PROCALCITONIN, [place]  Microbiology: Recent Results (from the past 240 hour(s))  Resp Panel by RT-PCR (Flu A&B, Covid) Nasopharyngeal Swab     Status: None   Collection Time: 01/16/21 10:34 PM   Specimen: Nasopharyngeal Swab; Nasopharyngeal(NP) swabs in vial transport medium  Result Value Ref Range Status   SARS Coronavirus 2 by RT PCR NEGATIVE NEGATIVE Final    Comment: (NOTE) SARS-CoV-2 target nucleic acids are NOT DETECTED.  The SARS-CoV-2 RNA is generally detectable in upper respiratory specimens during the acute phase of infection. The lowest concentration of SARS-CoV-2 viral copies this assay can detect is 138 copies/mL. A negative result does not preclude SARS-Cov-2 infection and should not be used as the sole basis for treatment or other patient management decisions. A negative result may occur with  improper specimen collection/handling, submission of specimen other than nasopharyngeal swab, presence of viral mutation(s) within the areas targeted by this assay, and inadequate number of viral copies(<138 copies/mL). A negative result must be combined with clinical observations, patient history, and epidemiological information. The expected result is Negative.  Fact Sheet for Patients:  EntrepreneurPulse.com.au  Fact Sheet for Healthcare Providers:  IncredibleEmployment.be  This test is no t yet approved or cleared by the [place] FDA and  has been authorized for detection and/or diagnosis of SARS-CoV-2 by FDA under an Emergency Use Authorization (EUA). This EUA will remain  in effect (meaning this test can be used) for the duration of the COVID-19 declaration under Section 564(b)(1) of the Act, 21 U.S.C.section 360bbb-3(b)(1), unless the authorization is terminated  or revoked sooner.       Influenza A by  PCR NEGATIVE NEGATIVE Final   Influenza B by PCR NEGATIVE NEGATIVE Final    Comment: (NOTE) The Xpert Xpress SARS-CoV-2/FLU/RSV plus assay is intended as an aid in the diagnosis of influenza from Nasopharyngeal swab specimens and should not be used as a sole basis for treatment. Nasal washings and aspirates are unacceptable for Xpert Xpress SARS-CoV-2/FLU/RSV testing.  Fact Sheet for Patients: EntrepreneurPulse.com.au  Fact Sheet for Healthcare Providers: IncredibleEmployment.be  This test is not yet approved or cleared by the [place] FDA and has been authorized for detection and/or diagnosis of SARS-CoV-2 by FDA under an Emergency Use Authorization (EUA). This EUA will remain  in effect (meaning this test can be used) for the duration of the COVID-19 declaration under Section 564(b)(1) of the Act, 21 U.S.C. section 360bbb-3(b)(1), unless the authorization is terminated or revoked.  Performed at [facility], [place] [address], [place], [place] [address]     Radiology Studies: CT Angio Abd/Pel W and/or Wo Contrast  Result Date: 01/16/2021 CLINICAL DATA:  Mesenteric ischemia, acute history of mesenteric ischemia s/p stent. acute abdominal pain EXAM: CTA ABDOMEN AND PELVIS WITHOUT AND WITH CONTRAST TECHNIQUE: Multidetector CT imaging of the abdomen and pelvis was performed using the standard protocol during bolus administration of intravenous contrast. Multiplanar reconstructed images and MIPs were obtained and reviewed to evaluate the vascular anatomy. CONTRAST:  138mL OMNIPAQUE IOHEXOL 350 MG/ML SOLN COMPARISON:  CT abdomen pelvis 09/15/2020 FINDINGS: VASCULAR No intravenous contrast extravasation to suggest active bleed. Aorta: Severe atherosclerotic plaque. Normal caliber aorta without aneurysm, dissection, vasculitis or significant stenosis. Celiac: Stent of the origin of the celiac artery is patent but narrowed due to atherosclerotic  plaque. Otherwise patent without evidence of aneurysm, dissection, vasculitis or significant stenosis. SMA: Extent of the origin of the superior mesenteric artery is patent but narrowed due to atherosclerotic plaque. Patent without evidence of aneurysm, dissection, vasculitis or significant stenosis. Renals: At least mild atherosclerotic plaque. Both renal arteries are patent without evidence of aneurysm, dissection, vasculitis, fibromuscular dysplasia or significant stenosis. IMA: Patent without evidence of aneurysm, dissection, vasculitis or significant stenosis. Inflow: Mild to moderate atherosclerotic plaque. Patent without evidence of aneurysm, dissection, vasculitis or significant stenosis. Proximal Outflow: Bilateral common femoral and visualized portions of the superficial and profunda femoral arteries are patent without evidence of aneurysm, dissection, vasculitis or significant stenosis. Veins: The main portal, splenic, superior mesenteric veins are patent. No portal or mesenteric venous gas. Review of the MIP images confirms the above findings. NON-VASCULAR Lower chest: Trace volume right pleural effusion. Coronary artery calcifications. Hepatobiliary: No focal liver abnormality. No gallstones, gallbladder wall thickening, or pericholecystic fluid. No biliary dilatation. Pancreas: No focal lesion. Normal pancreatic contour. Mild peripancreatic fat stranding along the proximal pancreas which may be due to the adjacent duodenum. No main pancreatic ductal dilatation. Spleen: Normal in size without focal abnormality. Punctate calcifications likely sequelae of prior granulomatous disease. Adrenals/Urinary Tract: No adrenal nodule bilaterally. Bilateral kidneys enhance symmetrically. Fluid density lesions likely represent simple renal cysts. Subcentimeter hypodensities are too small to characterize. No hydronephrosis. No hydroureter. The urinary bladder is unremarkable. Stomach/Bowel: Circumferential diffuse  bowel wall thickening of the 2nd-4th portion of the duodenum with associated marked duodenal fat stranding and surrounding mesenteric edema. No pneumatosis. The duodenal bowel wall does not enhance well; this however may be due to timing of contrast on the portal venous phase. No bowel dilatation. The large bowel demonstrates no bowel dilatation or bowel wall thickening. The appendix is unremarkable. Lymphatic: No lymphadenopathy. Reproductive: Prostate is unremarkable. Other: Trace free fluid within the abdomen pelvis. No intraperitoneal free gas. No organized fluid collection. Musculoskeletal: No abdominal wall hernia or abnormality. No suspicious lytic or blastic osseous lesions. No acute displaced fracture. Multilevel mild degenerative changes of the spine. IMPRESSION: VASCULAR 1. Patent celiac and superior mesenteric artery stents with some narrowing due to atherosclerotic plaque. 2. Aortic Atherosclerosis (ICD10-I70.0) including coronary artery calcifications. 3. No intravenous contrast extravasation to suggest active bleed. NON-VASCULAR 1. Marked duodenitis. Bowel ischemia is not excluded given extent of inflammation and poorly enhancing duodenal wall - this may be due to timing of contrast on the portal venous  phase. Correlate with lactate levels. 2. Mild peripancreatic fat stranding along the proximal pancreas which may be due to the adjacent duodenal inflammation with no definite hazy pancreatic contour and no poorly defined/edematous pancreatic parenchyma. 3. Trace right pleural effusion. These results were called by telephone at the time of interpretation on 01/16/2021 at 5:41 pm to provider Dr. Sabra Heck, who verbally acknowledged these results. Electronically Signed   By: Iven Finn M.D.   On: 01/16/2021 17:52       Milynn Quirion T. Armstrong  If 7PM-7AM, please contact night-coverage [email or web address] 01/17/2021, 1:40 PM

## 2021-01-17 NOTE — Progress Notes (Signed)
Refused cpap.

## 2021-01-17 NOTE — Plan of Care (Signed)

## 2021-01-18 DIAGNOSIS — I482 Chronic atrial fibrillation, unspecified: Secondary | ICD-10-CM | POA: Diagnosis not present

## 2021-01-18 DIAGNOSIS — K859 Acute pancreatitis without necrosis or infection, unspecified: Secondary | ICD-10-CM | POA: Diagnosis not present

## 2021-01-18 DIAGNOSIS — K551 Chronic vascular disorders of intestine: Secondary | ICD-10-CM | POA: Diagnosis not present

## 2021-01-18 LAB — GLUCOSE, CAPILLARY
Glucose-Capillary: 114 mg/dL — ABNORMAL HIGH (ref 70–99)
Glucose-Capillary: 117 mg/dL — ABNORMAL HIGH (ref 70–99)
Glucose-Capillary: 121 mg/dL — ABNORMAL HIGH (ref 70–99)
Glucose-Capillary: 128 mg/dL — ABNORMAL HIGH (ref 70–99)
Glucose-Capillary: 136 mg/dL — ABNORMAL HIGH (ref 70–99)
Glucose-Capillary: 152 mg/dL — ABNORMAL HIGH (ref 70–99)

## 2021-01-18 LAB — CBC WITH DIFFERENTIAL/PLATELET
Abs Immature Granulocytes: 0.04 10*3/uL (ref 0.00–0.07)
Basophils Absolute: 0 10*3/uL (ref 0.0–0.1)
Basophils Relative: 0 %
Eosinophils Absolute: 0 10*3/uL (ref 0.0–0.5)
Eosinophils Relative: 0 %
HCT: 23.2 % — ABNORMAL LOW (ref 39.0–52.0)
Hemoglobin: 6.9 g/dL — CL (ref 13.0–17.0)
Immature Granulocytes: 1 %
Lymphocytes Relative: 11 %
Lymphs Abs: 0.9 10*3/uL (ref 0.7–4.0)
MCH: 24.6 pg — ABNORMAL LOW (ref 26.0–34.0)
MCHC: 29.7 g/dL — ABNORMAL LOW (ref 30.0–36.0)
MCV: 82.6 fL (ref 80.0–100.0)
Monocytes Absolute: 0.7 10*3/uL (ref 0.1–1.0)
Monocytes Relative: 9 %
Neutro Abs: 6.5 10*3/uL (ref 1.7–7.7)
Neutrophils Relative %: 79 %
Platelets: 204 10*3/uL (ref 150–400)
RBC: 2.81 MIL/uL — ABNORMAL LOW (ref 4.22–5.81)
RDW: 19.1 % — ABNORMAL HIGH (ref 11.5–15.5)
WBC: 8.1 10*3/uL (ref 4.0–10.5)
nRBC: 0 % (ref 0.0–0.2)

## 2021-01-18 LAB — IRON AND TIBC
Iron: 21 ug/dL — ABNORMAL LOW (ref 45–182)
Saturation Ratios: 4 % — ABNORMAL LOW (ref 17.9–39.5)
TIBC: 496 ug/dL — ABNORMAL HIGH (ref 250–450)
UIBC: 475 ug/dL

## 2021-01-18 LAB — COMPREHENSIVE METABOLIC PANEL
ALT: 7 U/L (ref 0–44)
AST: 11 U/L — ABNORMAL LOW (ref 15–41)
Albumin: 2.8 g/dL — ABNORMAL LOW (ref 3.5–5.0)
Alkaline Phosphatase: 50 U/L (ref 38–126)
Anion gap: 8 (ref 5–15)
BUN: 22 mg/dL (ref 8–23)
CO2: 24 mmol/L (ref 22–32)
Calcium: 8.5 mg/dL — ABNORMAL LOW (ref 8.9–10.3)
Chloride: 108 mmol/L (ref 98–111)
Creatinine, Ser: 0.88 mg/dL (ref 0.61–1.24)
GFR, Estimated: >60 mL/min (ref >60)
Glucose, Bld: 112 mg/dL — ABNORMAL HIGH (ref 70–99)
Potassium: 4 mmol/L (ref 3.5–5.1)
Sodium: 140 mmol/L (ref 135–145)
Total Bilirubin: 1 mg/dL (ref 0.3–1.2)
Total Protein: 6 g/dL — ABNORMAL LOW (ref 6.5–8.1)

## 2021-01-18 LAB — LIPASE, BLOOD: Lipase: 122 U/L — ABNORMAL HIGH (ref 11–51)

## 2021-01-18 LAB — RETICULOCYTES
Immature Retic Fract: 26.5 % — ABNORMAL HIGH (ref 2.3–15.9)
RBC.: 2.91 MIL/uL — ABNORMAL LOW (ref 4.22–5.81)
Retic Count, Absolute: 69.8 10*3/uL (ref 19.0–186.0)
Retic Ct Pct: 2.4 % (ref 0.4–3.1)

## 2021-01-18 LAB — VITAMIN B12: Vitamin B-12: 1103 pg/mL — ABNORMAL HIGH (ref 180–914)

## 2021-01-18 LAB — HEMOGLOBIN A1C
Hgb A1c MFr Bld: 5.9 % — ABNORMAL HIGH (ref 4.8–5.6)
Mean Plasma Glucose: 122.63 mg/dL

## 2021-01-18 LAB — PHOSPHORUS: Phosphorus: 2.3 mg/dL — ABNORMAL LOW (ref 2.5–4.6)

## 2021-01-18 LAB — HEMOGLOBIN AND HEMATOCRIT, BLOOD
HCT: 24.2 % — ABNORMAL LOW (ref 39.0–52.0)
Hemoglobin: 7.2 g/dL — ABNORMAL LOW (ref 13.0–17.0)

## 2021-01-18 LAB — MAGNESIUM: Magnesium: 2.1 mg/dL (ref 1.7–2.4)

## 2021-01-18 LAB — FOLATE: Folate: 8.6 ng/mL (ref >5.9)

## 2021-01-18 LAB — FERRITIN: Ferritin: 34 ng/mL (ref 24–336)

## 2021-01-18 LAB — PROCALCITONIN: Procalcitonin: 0.1 ng/mL

## 2021-01-18 MED ORDER — PIPERACILLIN-TAZOBACTAM 3.375 G IVPB
3.3750 g | Freq: Three times a day (TID) | INTRAVENOUS | Status: DC
  Administered 2021-01-18 – 2021-01-20 (×5): 3.375 g via INTRAVENOUS
  Filled 2021-01-18 (×5): qty 50

## 2021-01-18 MED ORDER — SODIUM CHLORIDE 0.9 % IV SOLN: INTRAVENOUS | Status: DC | PRN

## 2021-01-18 NOTE — Progress Notes (Signed)
Walked into patients room for bedside report and patient had disconnected IV and IV tubing was laying in trash can. RN explained to patient that he should call for assistance when needed and not to disconnect anything himself.

## 2021-01-18 NOTE — Progress Notes (Signed)
Pharmacy Antibiotic Note  Benjamin Fox is a 65 y.o. male admitted on 01/16/2021 with  intra-abdominal infection .  Pharmacy has been consulted for Zosyn dosing. Afebrile, WBC 8.1, SCr 0.88.   Plan: Stop Unasyn Start Zosyn 3.375g q8h  Monitor renal fx, signs of clinical improvement, etc.   Height: 6\' 1"  (185.4 cm) Weight: 122.9 kg (270 lb 14.4 oz) IBW/kg (Calculated) : 79.9  Temp (24hrs), Avg:98.3 F (36.8 C), Min:97.8 F (36.6 C), Max:98.9 F (37.2 C)  Recent Labs  Lab 01/16/21 0805 01/16/21 1611 01/17/21 0524 01/17/21 1351 01/17/21 1633 01/18/21 0132 01/18/21 0924  WBC 11.6*  --  10.6*  --   --   --  8.1  CREATININE 0.83  --  0.79  --   --  0.88  --   LATICACIDVEN  --  1.1  --  1.0 1.2  --   --     Estimated Creatinine Clearance: 111.9 mL/min (by C-G formula based on SCr of 0.88 mg/dL).    No Known Allergies  Antimicrobials this admission: Zosyn 12/23, 12/25>> Unasyn 12/24>>12/25  Microbiology results: None   Thank you for allowing pharmacy to be a part of this patients care.  Donald Pore 01/18/2021 3:16 PM

## 2021-01-18 NOTE — Progress Notes (Signed)
PROGRESS NOTE    [place] Fox  DPO:[id number] DOB: 06/30/1955 DOA: 01/16/2021 PCP: [place], Julie M, NP   Brief Narrative:  65 year old M with PMH of pancreatitis, chronic A. fib on Xarelto, chronic mesenteric ischemia s/p celiac and SMA stenting and subsequent thrombectomy for occluded stents 09/15/2020, CAD s/p RCA stenting, T2DM, HTN, HLD, anemia of chronic disease, depression/anxiety, and OSA on CPAP who presented to the ED for evaluation of abdominal pain, and admitted for acute uncomplicated pancreatitis.  Denies history of EtOH.  LFT within normal.  IR consulted and recommended CTA.  CTA abdomen and pelvis showed patent celiac and SMA stents with some narrowing due to atherosclerotic plaque, marked duodenitis and mild peripancreatic fat stranding along the proximal pancreas.  VVS  consulted and have very low suspicion for acute mesenteric ischemia.  Patient received IV Zosyn, and started on IV fluid, Zofran and morphine and admitted.  Assessment & Plan:   Principal Problem:   Acute pancreatitis Active Problems:   Chronic atrial fibrillation (HCC)   Coronary artery disease   Hyperlipidemia associated with type 2 diabetes mellitus (HCC)   Hypertension associated with diabetes (HCC)   OSA (obstructive sleep apnea)   Chronic mesenteric ischemia (HCC)   Type 2 diabetes mellitus ([place])   Duodenitis  Acute pancreatitis/duodenitis: Noted on CTA.  LFT within normal.  Denies alcohol.    Recent triglycerides 62.  Calcium within normal.  Is slightly improved.  Interestingly, his tenderness is located at right lower quadrant.  I will start him on clears and advance to full liquid diet later today if clears tolerated.  Continue IV PPI.  Patient is on Unasyn.  Unsure whether his duodenitis is infectious or not.  Nonetheless, Unasyn is not a good choice so I will transition him to Zosyn.  I will check procalcitonin and based on that I will further decide about antibiotics.   Chronic mesenteric  ischemia: s/p celiac and SMA stenting. IR and vascular surgery have evaluated patient while in the ED. CTA largely reassuring from vascular standpoint. Continue Plavix, Xarelto, statin   Persistent atrial fibrillation: Irregular rhythm but rate controlled. -Continue Cardizem and Xarelto   CAD s/p RCA stenting: No cardiopulmonary symptoms. -Continue aspirin, statin, Coreg.   Anemia of chronic disease: Hemoglobin this morning dropped to 6.9.  Repeat H&H shows 7.2.  His baseline is between 8 and 9.  No indication of transfusion but monitor closely.  DM-2 with hyperglycemia and hyperlipidemia: On metformin at home.  Blood sugar controlled on SSI.  Continue statin.   Hypertension: Very well controlled. -Continue home Coreg, diltiazem, lisinopril.   Depression/anxiety: Stable. -Continue sertraline.   OSA: -Continue CPAP nightly.   Hypokalemia: Resolved.  DVT prophylaxis:    Code Status: Full Code  Family Communication:  None present at bedside.  Plan of care discussed with patient in length and he verbalized understanding and agreed with it.  Status is: Inpatient  Remains inpatient appropriate because: Still with pain  Estimated body mass index is 35.74 kg/m as calculated from the following:   Height as of this encounter: 6\' 1"  (1.854 m).   Weight as of this encounter: 122.9 kg.  Nutritional Assessment: Body mass index is 35.74 kg/m.Marland Kitchen Seen by dietician.  I agree with the assessment and plan as outlined below: Nutrition Status:  Skin Assessment: I have examined the patient's skin and I agree with the wound assessment as performed by the wound care RN as outlined below:    Consultants:  Vascular surgery-signed off IR-signed off  Procedures:  None  Antimicrobials:  Anti-infectives (From admission, onward)    Start     Dose/Rate Route Frequency Ordered Stop   01/17/21 1430  Ampicillin-Sulbactam (UNASYN) 3 g in sodium chloride 0.9 % 100 mL IVPB        3 g 200 mL/hr  over 30 Minutes Intravenous Every 6 hours 01/17/21 1339 01/22/21 1429   01/16/21 1930  piperacillin-tazobactam (ZOSYN) IVPB 3.375 g        3.375 g 100 mL/hr over 30 Minutes Intravenous  Once 01/16/21 1923 01/16/21 2304          Subjective: Seen and examined.  He states that his pain is better.  He looks very comfortable but he thinks his pain is 7 out of 10.  No nausea or other complaint.  Objective: Vitals:   01/18/21 0021 01/18/21 0445 01/18/21 0700 01/18/21 1246  BP: 120/68 131/74 (!) 146/76 (!) 116/56  Pulse: (!) 56 83 88 94  Resp: 18 18 17 18   Temp: 98.1 F (36.7 C) 98.2 F (36.8 C) 98.3 F (36.8 C) 97.8 F (36.6 C)  TempSrc: Oral Oral Oral Oral  SpO2: 93% 93% 93%   Weight:  122.9 kg    Height:        Intake/Output Summary (Last 24 hours) at 01/18/2021 1453 Last data filed at 01/18/2021 1300 Gross per 24 hour  Intake 2303.38 ml  Output 300 ml  Net 2003.38 ml   Filed Weights   01/17/21 0128 01/18/21 0445  Weight: 117.9 kg 122.9 kg    Examination:  General exam: Appears calm and comfortable, obese Respiratory system: Clear to auscultation. Respiratory effort normal. Cardiovascular system: S1 & S2 heard, RRR. No JVD, murmurs, rubs, gallops or clicks. No pedal edema. Gastrointestinal system: Abdomen is nondistended, soft with right lower quadrant tenderness. No organomegaly or masses felt. Normal bowel sounds heard. Central nervous system: Alert and oriented. No focal neurological deficits. Extremities: Symmetric 5 x 5 power. Skin: No rashes, lesions or ulcers Psychiatry: Judgement and insight appear normal. Mood & affect appropriate.    Data Reviewed: I have personally reviewed following labs and imaging studies  CBC: Recent Labs  Lab 01/16/21 0805 01/17/21 0524 01/18/21 0924 01/18/21 1409  WBC 11.6* 10.6* 8.1  --   NEUTROABS  --   --  6.5  --   HGB 8.6* 8.1* 6.9* 7.2*  HCT 29.7* 28.3* 23.2* 24.2*  MCV 83.9 84.2 82.6  --   PLT 258 263 204  --     Basic Metabolic Panel: Recent Labs  Lab 01/16/21 0805 01/17/21 0524 01/17/21 0930 01/18/21 0132  NA 139 138  --  140  K 3.5 3.4*  --  4.0  CL 107 108  --  108  CO2 22 22  --  24  GLUCOSE 149* 125*  --  112*  BUN 19 16  --  22  CREATININE 0.83 0.79  --  0.88  CALCIUM 9.0 8.5*  --  8.5*  MG  --   --  1.7 2.1  PHOS  --   --   --  2.3*   GFR: Estimated Creatinine Clearance: 111.9 mL/min (by C-G formula based on SCr of 0.88 mg/dL). Liver Function Tests: Recent Labs  Lab 01/16/21 0805 01/17/21 0524 01/18/21 0132  AST 15 14* 11*  ALT 11 9 7   ALKPHOS 68 57 50  BILITOT 0.8 1.0 1.0  PROT 7.4 7.0 6.0*  ALBUMIN 3.6 3.4* 2.8*   Recent Labs  Lab 01/16/21 0805 01/17/21  0930 01/18/21 0132  LIPASE 445* 129* 122*   No results for input(s): AMMONIA in the last 168 hours. Coagulation Profile: No results for input(s): INR, PROTIME in the last 168 hours. Cardiac Enzymes: No results for input(s): CKTOTAL, CKMB, CKMBINDEX, TROPONINI in the last 168 hours. BNP (last 3 results) No results for input(s): PROBNP in the last 8760 hours. HbA1C: Recent Labs    01/18/21 0132  HGBA1C 5.9*   CBG: Recent Labs  Lab 01/17/21 2126 01/18/21 0022 01/18/21 0443 01/18/21 0758 01/18/21 1225  GLUCAP 131* 114* 136* 121* 117*   Lipid Profile: No results for input(s): CHOL, HDL, LDLCALC, TRIG, CHOLHDL, LDLDIRECT in the last 72 hours. Thyroid Function Tests: No results for input(s): TSH, T4TOTAL, FREET4, T3FREE, THYROIDAB in the last 72 hours. Anemia Panel: Recent Labs    01/18/21 1409  RETICCTPCT 2.4   Sepsis Labs: Recent Labs  Lab 01/16/21 1611 01/17/21 1351 01/17/21 1633  LATICACIDVEN 1.1 1.0 1.2    Recent Results (from the past 240 hour(s))  Resp Panel by RT-PCR (Flu A&B, Covid) Nasopharyngeal Swab     Status: None   Collection Time: 01/16/21 10:34 PM   Specimen: Nasopharyngeal Swab; Nasopharyngeal(NP) swabs in vial transport medium  Result Value Ref Range Status   SARS  Coronavirus 2 by RT PCR NEGATIVE NEGATIVE Final    Comment: (NOTE) SARS-CoV-2 target nucleic acids are NOT DETECTED.  The SARS-CoV-2 RNA is generally detectable in upper respiratory specimens during the acute phase of infection. The lowest concentration of SARS-CoV-2 viral copies this assay can detect is 138 copies/mL. A negative result does not preclude SARS-Cov-2 infection and should not be used as the sole basis for treatment or other patient management decisions. A negative result may occur with  improper specimen collection/handling, submission of specimen other than nasopharyngeal swab, presence of viral mutation(s) within the areas targeted by this assay, and inadequate number of viral copies(<138 copies/mL). A negative result must be combined with clinical observations, patient history, and epidemiological information. The expected result is Negative.  Fact Sheet for Patients:  EntrepreneurPulse.com.au  Fact Sheet for Healthcare Providers:  IncredibleEmployment.be  This test is no t yet approved or cleared by the [place] FDA and  has been authorized for detection and/or diagnosis of SARS-CoV-2 by FDA under an Emergency Use Authorization (EUA). This EUA will remain  in effect (meaning this test can be used) for the duration of the COVID-19 declaration under Section 564(b)(1) of the Act, 21 U.S.C.section 360bbb-3(b)(1), unless the authorization is terminated  or revoked sooner.       Influenza A by PCR NEGATIVE NEGATIVE Final   Influenza B by PCR NEGATIVE NEGATIVE Final    Comment: (NOTE) The Xpert Xpress SARS-CoV-2/FLU/RSV plus assay is intended as an aid in the diagnosis of influenza from Nasopharyngeal swab specimens and should not be used as a sole basis for treatment. Nasal washings and aspirates are unacceptable for Xpert Xpress SARS-CoV-2/FLU/RSV testing.  Fact Sheet for  Patients: EntrepreneurPulse.com.au  Fact Sheet for Healthcare Providers: IncredibleEmployment.be  This test is not yet approved or cleared by the [place] FDA and has been authorized for detection and/or diagnosis of SARS-CoV-2 by FDA under an Emergency Use Authorization (EUA). This EUA will remain in effect (meaning this test can be used) for the duration of the COVID-19 declaration under Section 564(b)(1) of the Act, 21 U.S.C. section 360bbb-3(b)(1), unless the authorization is terminated or revoked.  Performed at [facility], [place] [address] [place], [place] [address]  Radiology Studies: CT Angio Abd/Pel W and/or Wo Contrast  Result Date: 01/16/2021 CLINICAL DATA:  Mesenteric ischemia, acute history of mesenteric ischemia s/p stent. acute abdominal pain EXAM: CTA ABDOMEN AND PELVIS WITHOUT AND WITH CONTRAST TECHNIQUE: Multidetector CT imaging of the abdomen and pelvis was performed using the standard protocol during bolus administration of intravenous contrast. Multiplanar reconstructed images and MIPs were obtained and reviewed to evaluate the vascular anatomy. CONTRAST:  144mL OMNIPAQUE IOHEXOL 350 MG/ML SOLN COMPARISON:  CT abdomen pelvis 09/15/2020 FINDINGS: VASCULAR No intravenous contrast extravasation to suggest active bleed. Aorta: Severe atherosclerotic plaque. Normal caliber aorta without aneurysm, dissection, vasculitis or significant stenosis. Celiac: Stent of the origin of the celiac artery is patent but narrowed due to atherosclerotic plaque. Otherwise patent without evidence of aneurysm, dissection, vasculitis or significant stenosis. SMA: Extent of the origin of the superior mesenteric artery is patent but narrowed due to atherosclerotic plaque. Patent without evidence of aneurysm, dissection, vasculitis or significant stenosis. Renals: At least mild atherosclerotic plaque. Both renal arteries are patent without evidence  of aneurysm, dissection, vasculitis, fibromuscular dysplasia or significant stenosis. IMA: Patent without evidence of aneurysm, dissection, vasculitis or significant stenosis. Inflow: Mild to moderate atherosclerotic plaque. Patent without evidence of aneurysm, dissection, vasculitis or significant stenosis. Proximal Outflow: Bilateral common femoral and visualized portions of the superficial and profunda femoral arteries are patent without evidence of aneurysm, dissection, vasculitis or significant stenosis. Veins: The main portal, splenic, superior mesenteric veins are patent. No portal or mesenteric venous gas. Review of the MIP images confirms the above findings. NON-VASCULAR Lower chest: Trace volume right pleural effusion. Coronary artery calcifications. Hepatobiliary: No focal liver abnormality. No gallstones, gallbladder wall thickening, or pericholecystic fluid. No biliary dilatation. Pancreas: No focal lesion. Normal pancreatic contour. Mild peripancreatic fat stranding along the proximal pancreas which may be due to the adjacent duodenum. No main pancreatic ductal dilatation. Spleen: Normal in size without focal abnormality. Punctate calcifications likely sequelae of prior granulomatous disease. Adrenals/Urinary Tract: No adrenal nodule bilaterally. Bilateral kidneys enhance symmetrically. Fluid density lesions likely represent simple renal cysts. Subcentimeter hypodensities are too small to characterize. No hydronephrosis. No hydroureter. The urinary bladder is unremarkable. Stomach/Bowel: Circumferential diffuse bowel wall thickening of the 2nd-4th portion of the duodenum with associated marked duodenal fat stranding and surrounding mesenteric edema. No pneumatosis. The duodenal bowel wall does not enhance well; this however may be due to timing of contrast on the portal venous phase. No bowel dilatation. The large bowel demonstrates no bowel dilatation or bowel wall thickening. The appendix is  unremarkable. Lymphatic: No lymphadenopathy. Reproductive: Prostate is unremarkable. Other: Trace free fluid within the abdomen pelvis. No intraperitoneal free gas. No organized fluid collection. Musculoskeletal: No abdominal wall hernia or abnormality. No suspicious lytic or blastic osseous lesions. No acute displaced fracture. Multilevel mild degenerative changes of the spine. IMPRESSION: VASCULAR 1. Patent celiac and superior mesenteric artery stents with some narrowing due to atherosclerotic plaque. 2. Aortic Atherosclerosis (ICD10-I70.0) including coronary artery calcifications. 3. No intravenous contrast extravasation to suggest active bleed. NON-VASCULAR 1. Marked duodenitis. Bowel ischemia is not excluded given extent of inflammation and poorly enhancing duodenal wall - this may be due to timing of contrast on the portal venous phase. Correlate with lactate levels. 2. Mild peripancreatic fat stranding along the proximal pancreas which may be due to the adjacent duodenal inflammation with no definite hazy pancreatic contour and no poorly defined/edematous pancreatic parenchyma. 3. Trace right pleural effusion. These results were called by telephone at the time of  interpretation on 01/16/2021 at 5:41 pm to provider Dr. Sabra Heck, who verbally acknowledged these results. Electronically Signed   By: Iven Finn M.D.   On: 01/16/2021 17:52    Scheduled Meds:  atorvastatin  5 mg Oral BID   carvedilol  25 mg Oral BID   clopidogrel  75 mg Oral q morning   diltiazem  240 mg Oral q morning   insulin aspart  0-9 Units Subcutaneous Q4H   ketorolac  15 mg Intravenous Q8H   lisinopril  30 mg Oral BID   pantoprazole (PROTONIX) IV  40 mg Intravenous Q12H   rivaroxaban  20 mg Oral Q supper   sertraline  100 mg Oral QPM   sodium chloride flush  3 mL Intravenous Q12H   Continuous Infusions:  ampicillin-sulbactam (UNASYN) IV 3 g (01/18/21 1329)   lactated ringers 125 mL/hr at 01/18/21 0739     LOS: 1 day    Time spent: 35 minutes   Darliss Cheney, MD [facility]  01/18/2021, 2:53 PM  Please page via [place] and do not message via secure chat for anything urgent. Secure chat can be used for anything non urgent.  How to contact the [facility] Attending or Consulting provider [place] or covering provider during after hours [place], for this patient?  Check the care team in [facility] and look for a) attending/consulting [facility] provider listed and b) the [facility] team listed. Page or secure chat [phone]. Log into [email or web address] and use [place]'s universal password to access. If you do not have the password, please contact the hospital operator. Locate the [facility] provider you are looking for under [facility] and page to a number that you can be directly reached. If you still have difficulty reaching the provider, please page the [facility] (Director on Call) for the Hospitalists listed on amion for assistance.

## 2021-01-18 NOTE — Progress Notes (Signed)
°   01/18/21 1042  Provider Notification  Provider Name/Title Dr. Darliss Cheney  Date Provider Notified 01/18/21  Time Provider Notified 1042  Notification Type Page  Notification Reason Critical result  Test performed and critical result HGB 6.9  Date Critical Result Received 01/18/21  Time Critical Result Received 1036  Provider response See new orders

## 2021-01-19 ENCOUNTER — Inpatient Hospital Stay (HOSPITAL_COMMUNITY): Payer: BC Managed Care – PPO

## 2021-01-19 ENCOUNTER — Encounter (HOSPITAL_COMMUNITY): Payer: Self-pay | Admitting: Student

## 2021-01-19 DIAGNOSIS — K551 Chronic vascular disorders of intestine: Secondary | ICD-10-CM | POA: Diagnosis not present

## 2021-01-19 DIAGNOSIS — K2980 Duodenitis without bleeding: Secondary | ICD-10-CM | POA: Diagnosis not present

## 2021-01-19 DIAGNOSIS — E1169 Type 2 diabetes mellitus with other specified complication: Secondary | ICD-10-CM

## 2021-01-19 DIAGNOSIS — E785 Hyperlipidemia, unspecified: Secondary | ICD-10-CM

## 2021-01-19 DIAGNOSIS — K859 Acute pancreatitis without necrosis or infection, unspecified: Secondary | ICD-10-CM | POA: Diagnosis not present

## 2021-01-19 DIAGNOSIS — I482 Chronic atrial fibrillation, unspecified: Secondary | ICD-10-CM | POA: Diagnosis not present

## 2021-01-19 LAB — CBC WITH DIFFERENTIAL/PLATELET
Abs Immature Granulocytes: 0.04 10*3/uL (ref 0.00–0.07)
Basophils Absolute: 0 10*3/uL (ref 0.0–0.1)
Basophils Relative: 0 %
Eosinophils Absolute: 0 10*3/uL (ref 0.0–0.5)
Eosinophils Relative: 0 %
HCT: 23.1 % — ABNORMAL LOW (ref 39.0–52.0)
Hemoglobin: 6.8 g/dL — CL (ref 13.0–17.0)
Immature Granulocytes: 1 %
Lymphocytes Relative: 9 %
Lymphs Abs: 0.7 10*3/uL (ref 0.7–4.0)
MCH: 24.5 pg — ABNORMAL LOW (ref 26.0–34.0)
MCHC: 29.4 g/dL — ABNORMAL LOW (ref 30.0–36.0)
MCV: 83.1 fL (ref 80.0–100.0)
Monocytes Absolute: 0.5 10*3/uL (ref 0.1–1.0)
Monocytes Relative: 7 %
Neutro Abs: 6.3 10*3/uL (ref 1.7–7.7)
Neutrophils Relative %: 83 %
Platelets: 216 10*3/uL (ref 150–400)
RBC: 2.78 MIL/uL — ABNORMAL LOW (ref 4.22–5.81)
RDW: 19 % — ABNORMAL HIGH (ref 11.5–15.5)
WBC: 7.5 10*3/uL (ref 4.0–10.5)
nRBC: 0.3 % — ABNORMAL HIGH (ref 0.0–0.2)

## 2021-01-19 LAB — GLUCOSE, CAPILLARY
Glucose-Capillary: 115 mg/dL — ABNORMAL HIGH (ref 70–99)
Glucose-Capillary: 119 mg/dL — ABNORMAL HIGH (ref 70–99)
Glucose-Capillary: 119 mg/dL — ABNORMAL HIGH (ref 70–99)
Glucose-Capillary: 122 mg/dL — ABNORMAL HIGH (ref 70–99)
Glucose-Capillary: 129 mg/dL — ABNORMAL HIGH (ref 70–99)
Glucose-Capillary: 144 mg/dL — ABNORMAL HIGH (ref 70–99)

## 2021-01-19 LAB — BASIC METABOLIC PANEL
Anion gap: 7 (ref 5–15)
BUN: 27 mg/dL — ABNORMAL HIGH (ref 8–23)
CO2: 25 mmol/L (ref 22–32)
Calcium: 8.1 mg/dL — ABNORMAL LOW (ref 8.9–10.3)
Chloride: 106 mmol/L (ref 98–111)
Creatinine, Ser: 0.97 mg/dL (ref 0.61–1.24)
GFR, Estimated: >60 mL/min (ref >60)
Glucose, Bld: 159 mg/dL — ABNORMAL HIGH (ref 70–99)
Potassium: 3.4 mmol/L — ABNORMAL LOW (ref 3.5–5.1)
Sodium: 138 mmol/L (ref 135–145)

## 2021-01-19 LAB — HEMOGLOBIN AND HEMATOCRIT, BLOOD
HCT: 25.1 % — ABNORMAL LOW (ref 39.0–52.0)
Hemoglobin: 7.7 g/dL — ABNORMAL LOW (ref 13.0–17.0)

## 2021-01-19 LAB — OCCULT BLOOD X 1 CARD TO LAB, STOOL: Fecal Occult Bld: NEGATIVE

## 2021-01-19 LAB — ABO/RH: ABO/RH(D): O POS

## 2021-01-19 LAB — PREPARE RBC (CROSSMATCH)

## 2021-01-19 MED ORDER — SODIUM CHLORIDE 0.9% IV SOLUTION: Freq: Once | INTRAVENOUS | Status: DC

## 2021-01-19 MED ORDER — POTASSIUM CHLORIDE CRYS ER 20 MEQ PO TBCR
40.0000 meq | EXTENDED_RELEASE_TABLET | Freq: Once | ORAL | Status: AC
  Administered 2021-01-19: 13:00:00 40 meq via ORAL
  Filled 2021-01-19: qty 2

## 2021-01-19 NOTE — Progress Notes (Signed)
Patient notified that stool sample is needed, hat to catch stool placed in toilet and patient says he will let us know if/when he goes.

## 2021-01-19 NOTE — Plan of Care (Signed)
  Problem: Health Behavior/Discharge Planning: Goal: Ability to manage health-related needs will improve Outcome: Progressing   Problem: Clinical Measurements: Goal: Ability to maintain clinical measurements within normal limits will improve Outcome: Progressing   

## 2021-01-19 NOTE — Plan of Care (Signed)
°  Problem: Education: °Goal: Knowledge of General Education information will improve °Description: Including pain rating scale, medication(s)/side effects and non-pharmacologic comfort measures °Outcome: Progressing °  °Problem: Health Behavior/Discharge Planning: °Goal: Ability to manage health-related needs will improve °Outcome: Progressing °  °Problem: Clinical Measurements: °Goal: Ability to maintain clinical measurements within normal limits will improve °Outcome: Progressing °  °Problem: Clinical Measurements: °Goal: Respiratory complications will improve °Outcome: Progressing °  °Problem: Activity: °Goal: Risk for activity intolerance will decrease °Outcome: Progressing °  °

## 2021-01-19 NOTE — TOC Progression Note (Signed)
Transition of Care [facility]) - Progression Note    Patient Details  Name: [place] Fox MRN: [id number] Date of Birth: May 24, 1955  Transition of Care [facility]) CM/SW Contact  Zenon Mayo, RN Phone Number: 01/19/2021, 4:08 PM  Clinical Narrative:     Transition of Care [facility]) Screening Note   Patient Details  Name: [place] Fox Date of Birth: [birth year]   Transition of Care [facility]) CM/SW Contact:    Zenon Mayo, RN Phone Number: 01/19/2021, 4:08 PM    Transition of Care Department [facility]) has reviewed patient and no TOC needs have been identified at this time. We will continue to monitor patient advancement through interdisciplinary progression rounds. If new patient transition needs arise, please place a TOC consult.          Expected Discharge Plan and Services                                                 Social Determinants of Health (SDOH) Interventions    Readmission Risk Interventions No flowsheet data found.

## 2021-01-19 NOTE — Progress Notes (Signed)
PROGRESS NOTE    Benjamin Fox  HUT:[id number] DOB: [birth year] DOA: 01/16/2021 PCP: [place], Julie M, NP   Brief Narrative:  65 year old M with PMH of pancreatitis, chronic A. fib on Xarelto, chronic mesenteric ischemia s/p celiac and SMA stenting and subsequent thrombectomy for occluded stents 09/15/2020, CAD s/p RCA stenting, T2DM, HTN, HLD, anemia of chronic disease, depression/anxiety, and OSA on CPAP who presented to the ED for evaluation of abdominal pain, and admitted for acute uncomplicated pancreatitis.  Denies history of EtOH.  LFT within normal.  IR consulted and recommended CTA.  CTA abdomen and pelvis showed patent celiac and SMA stents with some narrowing due to atherosclerotic plaque, marked duodenitis and mild peripancreatic fat stranding along the proximal pancreas.  VVS  consulted and have very low suspicion for acute mesenteric ischemia.  Patient received IV Zosyn, and started on IV fluid, Zofran and morphine and admitted.  Assessment & Plan:   Principal Problem:   Acute pancreatitis Active Problems:   Chronic atrial fibrillation (HCC)   Coronary artery disease   Hyperlipidemia associated with type 2 diabetes mellitus (HCC)   Hypertension associated with diabetes (HCC)   OSA (obstructive sleep apnea)   Chronic mesenteric ischemia (HCC)   Type 2 diabetes mellitus ([place])   Duodenitis  Acute pancreatitis/duodenitis: Noted on CTA.  LFT within normal.  Denies alcohol.    Recent triglycerides 62.  Calcium within normal.  Is slightly improved.  Still complains of 6 out of 10 pain which is better and tolerating full liquid diet.  He is tenderness is most pronounced at the right upper quadrant now as well as right lower quadrant and left lower quadrant, very minimally on the epigastrium.  He has positive Percell Miller sign which raises suspicion for possible acute cholecystitis.  I do not see any abdominal imaging done, we will proceed with ultrasound abdomen to rule out acute  cholecystitis.  If this rules out, I will advised him to soft diet.  Procalcitonin unremarkable.  He probably does not need antibiotics for duodenitis and if acute cholecystitis ruled out, I will discontinue Zosyn as well.  Continue IV PPI.    Chronic mesenteric ischemia: s/p celiac and SMA stenting. IR and vascular surgery have evaluated patient while in the ED. CTA largely reassuring from vascular standpoint. Continue Plavix, Xarelto, statin   Persistent atrial fibrillation: Irregular rhythm but rate controlled. -Continue Cardizem and Xarelto   CAD s/p RCA stenting: No cardiopulmonary symptoms. -Continue aspirin, statin, Coreg.   Anemia of chronic disease/iron deficiency anemia: His baseline hemoglobin is between 8 and 9.  Hemoglobin dropped to 6.8 today.  Iron studies indicate iron deficiency anemia.  I will transfuse him 1 unit of PRBC.  FOBT is ordered.  If positive, will need GI bleed work-up.  DM-2 with hyperglycemia and hyperlipidemia: On metformin at home.  Blood sugar controlled on SSI.  Continue statin.   Hypertension: Very well controlled. -Continue home Coreg, diltiazem, lisinopril.   Depression/anxiety: Stable. -Continue sertraline.   OSA: -Continue CPAP nightly.   Hypokalemia: Low again, will replace.  DVT prophylaxis:    Code Status: Full Code  Family Communication:  None present at bedside.  Plan of care discussed with patient in length and he verbalized understanding and agreed with it.  Status is: Inpatient  Remains inpatient appropriate because: Still with pain  Estimated body mass index is 36.55 kg/m as calculated from the following:   Height as of this encounter: 6\' 1"  (1.854 m).   Weight as of this encounter: 125.6  kg.  Nutritional Assessment: Body mass index is 36.55 kg/m.Marland Kitchen Seen by dietician.  I agree with the assessment and plan as outlined below: Nutrition Status:  Skin Assessment: I have examined the patient's skin and I agree with the wound  assessment as performed by the wound care RN as outlined below:    Consultants:  Vascular surgery-signed off IR-signed off  Procedures:  None  Antimicrobials:  Anti-infectives (From admission, onward)    Start     Dose/Rate Route Frequency Ordered Stop   01/18/21 2000  piperacillin-tazobactam (ZOSYN) IVPB 3.375 g        3.375 g 12.5 mL/hr over 240 Minutes Intravenous Every 8 hours 01/18/21 1515     01/17/21 1430  Ampicillin-Sulbactam (UNASYN) 3 g in sodium chloride 0.9 % 100 mL IVPB  Status:  Discontinued        3 g 200 mL/hr over 30 Minutes Intravenous Every 6 hours 01/17/21 1339 01/18/21 1501   01/16/21 1930  piperacillin-tazobactam (ZOSYN) IVPB 3.375 g        3.375 g 100 mL/hr over 30 Minutes Intravenous  Once 01/16/21 1923 01/16/21 2304          Subjective:  Seen and examined.  Still complaining of abdominal pain but improving.  No other complaint.  Not passing flatus.  Will check x-ray KUB to rule out SBO. Objective: Vitals:   01/18/21 0700 01/18/21 1246 01/18/21 2003 01/19/21 0401  BP: (!) 146/76 (!) 116/56 126/78 135/65  Pulse: 88 94 80 73  Resp: 17 18 18 18   Temp: 98.3 F (36.8 C) 97.8 F (36.6 C) 98.1 F (36.7 C) 98.5 F (36.9 C)  TempSrc: Oral Oral Oral Oral  SpO2: 93%  93% 93%  Weight:    125.6 kg  Height:        Intake/Output Summary (Last 24 hours) at 01/19/2021 1030 Last data filed at 01/19/2021 0817 Gross per 24 hour  Intake 3168.72 ml  Output 725 ml  Net 2443.72 ml    Filed Weights   01/17/21 0128 01/18/21 0445 01/19/21 0401  Weight: 117.9 kg 122.9 kg 125.6 kg    Examination:  General exam: Appears calm and comfortable, morbidly obese Respiratory system: Clear to auscultation. Respiratory effort normal. Cardiovascular system: S1 & S2 heard, RRR. No JVD, murmurs, rubs, gallops or clicks. No pedal edema. Gastrointestinal system: Abdomen is nondistended, soft and tender at right upper quadrant, right lower quadrant and periumbilical with  positive Murphy sign.. No organomegaly or masses felt. Normal bowel sounds heard. Central nervous system: Alert and oriented. No focal neurological deficits. Extremities: Symmetric 5 x 5 power. Skin: No rashes, lesions or ulcers.  Psychiatry: Judgement and insight appear normal. Mood & affect appropriate.    Data Reviewed: I have personally reviewed following labs and imaging studies  CBC: Recent Labs  Lab 01/16/21 0805 01/17/21 0524 01/18/21 0924 01/18/21 1409 01/19/21 0911  WBC 11.6* 10.6* 8.1  --  7.5  NEUTROABS  --   --  6.5  --  6.3  HGB 8.6* 8.1* 6.9* 7.2* 6.8*  HCT 29.7* 28.3* 23.2* 24.2* 23.1*  MCV 83.9 84.2 82.6  --  83.1  PLT 258 263 204  --  500    Basic Metabolic Panel: Recent Labs  Lab 01/16/21 0805 01/17/21 0524 01/17/21 0930 01/18/21 0132 01/19/21 0911  NA 139 138  --  140 138  K 3.5 3.4*  --  4.0 3.4*  CL 107 108  --  108 106  CO2 22 22  --  24 25  GLUCOSE 149* 125*  --  112* 159*  BUN 19 16  --  22 27*  CREATININE 0.83 0.79  --  0.88 0.97  CALCIUM 9.0 8.5*  --  8.5* 8.1*  MG  --   --  1.7 2.1  --   PHOS  --   --   --  2.3*  --     GFR: Estimated Creatinine Clearance: 102.6 mL/min (by C-G formula based on SCr of 0.97 mg/dL). Liver Function Tests: Recent Labs  Lab 01/16/21 0805 01/17/21 0524 01/18/21 0132  AST 15 14* 11*  ALT 11 9 7   ALKPHOS 68 57 50  BILITOT 0.8 1.0 1.0  PROT 7.4 7.0 6.0*  ALBUMIN 3.6 3.4* 2.8*    Recent Labs  Lab 01/16/21 0805 01/17/21 0930 01/18/21 0132  LIPASE 445* 129* 122*    No results for input(s): AMMONIA in the last 168 hours. Coagulation Profile: No results for input(s): INR, PROTIME in the last 168 hours. Cardiac Enzymes: No results for input(s): CKTOTAL, CKMB, CKMBINDEX, TROPONINI in the last 168 hours. BNP (last 3 results) No results for input(s): PROBNP in the last 8760 hours. HbA1C: Recent Labs    01/18/21 0132  HGBA1C 5.9*    CBG: Recent Labs  Lab 01/18/21 1739 01/18/21 2015  01/19/21 0017 01/19/21 0427 01/19/21 0743  GLUCAP 128* 152* 119* 115* 122*    Lipid Profile: No results for input(s): CHOL, HDL, LDLCALC, TRIG, CHOLHDL, LDLDIRECT in the last 72 hours. Thyroid Function Tests: No results for input(s): TSH, T4TOTAL, FREET4, T3FREE, THYROIDAB in the last 72 hours. Anemia Panel: Recent Labs    01/18/21 1409  VITAMINB12 1,103*  FOLATE 8.6  FERRITIN 34  TIBC 496*  IRON 21*  RETICCTPCT 2.4    Sepsis Labs: Recent Labs  Lab 01/16/21 1611 01/17/21 1351 01/17/21 1633 01/18/21 0924  PROCALCITON  --   --   --  0.10  LATICACIDVEN 1.1 1.0 1.2  --      Recent Results (from the past 240 hour(s))  Resp Panel by RT-PCR (Flu A&B, Covid) Nasopharyngeal Swab     Status: None   Collection Time: 01/16/21 10:34 PM   Specimen: Nasopharyngeal Swab; Nasopharyngeal(NP) swabs in vial transport medium  Result Value Ref Range Status   SARS Coronavirus 2 by RT PCR NEGATIVE NEGATIVE Final    Comment: (NOTE) SARS-CoV-2 target nucleic acids are NOT DETECTED.  The SARS-CoV-2 RNA is generally detectable in upper respiratory specimens during the acute phase of infection. The lowest concentration of SARS-CoV-2 viral copies this assay can detect is 138 copies/mL. A negative result does not preclude SARS-Cov-2 infection and should not be used as the sole basis for treatment or other patient management decisions. A negative result may occur with  improper specimen collection/handling, submission of specimen other than nasopharyngeal swab, presence of viral mutation(s) within the areas targeted by this assay, and inadequate number of viral copies(<138 copies/mL). A negative result must be combined with clinical observations, patient history, and epidemiological information. The expected result is Negative.  Fact Sheet for Patients:  EntrepreneurPulse.com.au  Fact Sheet for Healthcare Providers:  IncredibleEmployment.be  This test  is no t yet approved or cleared by the [place] FDA and  has been authorized for detection and/or diagnosis of SARS-CoV-2 by FDA under an Emergency Use Authorization (EUA). This EUA will remain  in effect (meaning this test can be used) for the duration of the COVID-19 declaration under Section 564(b)(1) of the Act, 21 U.S.C.section 360bbb-3(b)(1),  unless the authorization is terminated  or revoked sooner.       Influenza A by PCR NEGATIVE NEGATIVE Final   Influenza B by PCR NEGATIVE NEGATIVE Final    Comment: (NOTE) The Xpert Xpress SARS-CoV-2/FLU/RSV plus assay is intended as an aid in the diagnosis of influenza from Nasopharyngeal swab specimens and should not be used as a sole basis for treatment. Nasal washings and aspirates are unacceptable for Xpert Xpress SARS-CoV-2/FLU/RSV testing.  Fact Sheet for Patients: EntrepreneurPulse.com.au  Fact Sheet for Healthcare Providers: IncredibleEmployment.be  This test is not yet approved or cleared by the [place] FDA and has been authorized for detection and/or diagnosis of SARS-CoV-2 by FDA under an Emergency Use Authorization (EUA). This EUA will remain in effect (meaning this test can be used) for the duration of the COVID-19 declaration under Section 564(b)(1) of the Act, 21 U.S.C. section 360bbb-3(b)(1), unless the authorization is terminated or revoked.  Performed at [facility], [place] [address], [place], [place] [address]        Radiology Studies: No results found.  Scheduled Meds:  sodium chloride   Intravenous Once   atorvastatin  5 mg Oral BID   carvedilol  25 mg Oral BID   clopidogrel  75 mg Oral q morning   diltiazem  240 mg Oral q morning   insulin aspart  0-9 Units Subcutaneous Q4H   ketorolac  15 mg Intravenous Q8H   lisinopril  30 mg Oral BID   pantoprazole (PROTONIX) IV  40 mg Intravenous Q12H   rivaroxaban  20 mg Oral Q supper   sertraline  100 mg  Oral QPM   sodium chloride flush  3 mL Intravenous Q12H   Continuous Infusions:  sodium chloride Stopped (01/18/21 2033)   lactated ringers 125 mL/hr at 01/19/21 0552   piperacillin-tazobactam (ZOSYN)  IV 3.375 g (01/19/21 0553)     LOS: 2 days   Time spent: 30 minutes   Darliss Cheney, MD [facility]  01/19/2021, 10:30 AM  Please page via [place] and do not message via secure chat for anything urgent. Secure chat can be used for anything non urgent.  How to contact the [facility] Attending or Consulting provider [place] or covering provider during after hours [place], for this patient?  Check the care team in [facility] and look for a) attending/consulting TRH provider listed and b) the [facility] team listed. Page or secure chat [phone]. Log into [email or web address] and use [place]'s universal password to access. If you do not have the password, please contact the hospital operator. Locate the [facility] provider you are looking for under [facility] and page to a number that you can be directly reached. If you still have difficulty reaching the provider, please page the [facility] (Director on Call) for the Hospitalists listed on amion for assistance.

## 2021-01-20 DIAGNOSIS — K551 Chronic vascular disorders of intestine: Secondary | ICD-10-CM | POA: Diagnosis not present

## 2021-01-20 DIAGNOSIS — K2980 Duodenitis without bleeding: Secondary | ICD-10-CM | POA: Diagnosis not present

## 2021-01-20 DIAGNOSIS — K859 Acute pancreatitis without necrosis or infection, unspecified: Secondary | ICD-10-CM | POA: Diagnosis not present

## 2021-01-20 DIAGNOSIS — I482 Chronic atrial fibrillation, unspecified: Secondary | ICD-10-CM | POA: Diagnosis not present

## 2021-01-20 LAB — BPAM RBC
Blood Product Expiration Date: 202301022359
ISSUE DATE / TIME: 202212261636
Unit Type and Rh: 5100

## 2021-01-20 LAB — CBC WITH DIFFERENTIAL/PLATELET
Abs Immature Granulocytes: 0.03 10*3/uL (ref 0.00–0.07)
Basophils Absolute: 0 10*3/uL (ref 0.0–0.1)
Basophils Relative: 0 %
Eosinophils Absolute: 0 10*3/uL (ref 0.0–0.5)
Eosinophils Relative: 0 %
HCT: 23.4 % — ABNORMAL LOW (ref 39.0–52.0)
Hemoglobin: 7.1 g/dL — ABNORMAL LOW (ref 13.0–17.0)
Immature Granulocytes: 0 %
Lymphocytes Relative: 9 %
Lymphs Abs: 0.8 10*3/uL (ref 0.7–4.0)
MCH: 25.2 pg — ABNORMAL LOW (ref 26.0–34.0)
MCHC: 30.3 g/dL (ref 30.0–36.0)
MCV: 83 fL (ref 80.0–100.0)
Monocytes Absolute: 0.8 10*3/uL (ref 0.1–1.0)
Monocytes Relative: 10 %
Neutro Abs: 6.7 10*3/uL (ref 1.7–7.7)
Neutrophils Relative %: 81 %
Platelets: 207 10*3/uL (ref 150–400)
RBC: 2.82 MIL/uL — ABNORMAL LOW (ref 4.22–5.81)
RDW: 19.1 % — ABNORMAL HIGH (ref 11.5–15.5)
WBC: 8.3 10*3/uL (ref 4.0–10.5)
nRBC: 0.6 % — ABNORMAL HIGH (ref 0.0–0.2)

## 2021-01-20 LAB — GLUCOSE, CAPILLARY
Glucose-Capillary: 106 mg/dL — ABNORMAL HIGH (ref 70–99)
Glucose-Capillary: 121 mg/dL — ABNORMAL HIGH (ref 70–99)
Glucose-Capillary: 122 mg/dL — ABNORMAL HIGH (ref 70–99)
Glucose-Capillary: 126 mg/dL — ABNORMAL HIGH (ref 70–99)
Glucose-Capillary: 126 mg/dL — ABNORMAL HIGH (ref 70–99)
Glucose-Capillary: 131 mg/dL — ABNORMAL HIGH (ref 70–99)

## 2021-01-20 LAB — TYPE AND SCREEN
ABO/RH(D): O POS
Antibody Screen: NEGATIVE
Unit division: 0

## 2021-01-20 LAB — BASIC METABOLIC PANEL
Anion gap: 7 (ref 5–15)
BUN: 25 mg/dL — ABNORMAL HIGH (ref 8–23)
CO2: 25 mmol/L (ref 22–32)
Calcium: 8.2 mg/dL — ABNORMAL LOW (ref 8.9–10.3)
Chloride: 108 mmol/L (ref 98–111)
Creatinine, Ser: 0.99 mg/dL (ref 0.61–1.24)
GFR, Estimated: >60 mL/min (ref >60)
Glucose, Bld: 117 mg/dL — ABNORMAL HIGH (ref 70–99)
Potassium: 3.6 mmol/L (ref 3.5–5.1)
Sodium: 140 mmol/L (ref 135–145)

## 2021-01-20 LAB — HEMOGLOBIN AND HEMATOCRIT, BLOOD
HCT: 25.6 % — ABNORMAL LOW (ref 39.0–52.0)
Hemoglobin: 7.9 g/dL — ABNORMAL LOW (ref 13.0–17.0)

## 2021-01-20 LAB — MAGNESIUM: Magnesium: 1.8 mg/dL (ref 1.7–2.4)

## 2021-01-20 NOTE — Evaluation (Signed)
Occupational Therapy Evaluation and Discharge Patient Details Name: Benjamin Fox MRN: [id number] DOB: Jun 03, 1955 Today's Date: 01/20/2021   History of Present Illness Pt is a 65 y/o male admitted secondary to abdominal pain and found to have acute pancreatitis and duodenitis. PMH including but not limited to chronic atrial fibrillation on Xarelto, chronic mesenteric ischemia (s/p celiac and SMA stenting and subsequent thrombectomy for occluded stents 09/15/2020), CAD s/p RCA stenting, T2DM, HTN, HLD, anemia of chronic disease, depression/anxiety, and OSA on CPAP.   Clinical Impression   Pt has been ambulating in his room and to bathroom without AD and completing ADL with set up. He endorses feeling fatigue and abdominal pain which limits activity tolerance. Hgb currently 7.1, which may be contributing. Recommending continued ADL and mobility with nursing staff, no OT needs.      Recommendations for follow up therapy are one component of a multi-disciplinary discharge planning process, led by the attending physician.  Recommendations may be updated based on patient status, additional functional criteria and insurance authorization.   Follow Up Recommendations  No OT follow up    Assistance Recommended at Discharge    Functional Status Assessment  Patient has had a recent decline in their functional status and demonstrates the ability to make significant improvements in function in a reasonable and predictable amount of time.  Equipment Recommendations  None recommended by OT    Recommendations for Other Services       Precautions / Restrictions Precautions Precautions: Fall      Mobility Bed Mobility Overal bed mobility: Independent             General bed mobility comments: HOB flat    Transfers Overall transfer level: Independent Equipment used: None               General transfer comment: pt has been routinely walking to the bathroom      Balance Overall  balance assessment: Needs assistance   Sitting balance-Leahy Scale: Normal     Standing balance support: No upper extremity supported Standing balance-Leahy Scale: Fair                             ADL either performed or assessed with clinical judgement   ADL Overall ADL's : Modified independent                                             Vision Baseline Vision/History: 1 Wears glasses Ability to See in Adequate Light: 0 Adequate Patient Visual Report: No change from baseline       Perception     Praxis      Pertinent Vitals/Pain Pain Assessment: Faces Faces Pain Scale: Hurts even more Pain Location: abdomen Pain Descriptors / Indicators: Discomfort Pain Intervention(s): Monitored during session;Repositioned     Hand Dominance Right   Extremity/Trunk Assessment Upper Extremity Assessment Upper Extremity Assessment: Overall WFL for tasks assessed   Lower Extremity Assessment Lower Extremity Assessment: Defer to PT evaluation       Communication Communication Communication: No difficulties   Cognition Arousal/Alertness: Awake/alert Behavior During Therapy: WFL for tasks assessed/performed Overall Cognitive Status: Within Functional Limits for tasks assessed  General Comments       Exercises     Shoulder Instructions      Home Living Family/patient expects to be discharged to:: Private residence Living Arrangements: Spouse/significant other Available Help at Discharge: Family Type of Home: House Home Access: Stairs to enter Technical brewer of Steps: 3   Home Layout: One level     Bathroom Shower/Tub: Occupational psychologist: [place] - single point          Prior Functioning/Environment Prior Level of Function : Independent/Modified Independent             Mobility Comments: ambulates with a cane PRN ADLs  Comments: drives, stands to shower        OT Problem List:        OT Treatment/Interventions:      OT Goals(Current goals can be found in the care plan section)    OT Frequency:     Barriers to D/C:            Co-evaluation              AM-PAC OT "6 Clicks" Daily Activity     Outcome Measure Help from another person eating meals?: None Help from another person taking care of personal grooming?: None Help from another person toileting, which includes using toliet, bedpan, or urinal?: None Help from another person bathing (including washing, rinsing, drying)?: None Help from another person to put on and taking off regular upper body clothing?: None Help from another person to put on and taking off regular lower body clothing?: None 6 Click Score: 24   End of Session    Activity Tolerance: Patient tolerated treatment well Patient left: in bed;with call bell/phone within reach  OT Visit Diagnosis: Pain                Time: 4650-3546 OT Time Calculation (min): 21 min Charges:  OT General Charges $OT Visit: 1 Visit OT Evaluation $OT Eval Low Complexity: 1 Low Nestor Lewandowsky, OTR/L Acute Rehabilitation Services Pager: [phone] Office: [phone]   Malka So 01/20/2021, 2:56 PM

## 2021-01-20 NOTE — Evaluation (Signed)
Physical Therapy Evaluation Patient Details Name: Benjamin Fox MRN: [id number] DOB: Sep 30, 1955 Today's Date: 01/20/2021  History of Present Illness  Pt is a 65 y/o male admitted secondary to abdominal pain and found to have acute pancreatitis and duodenitis. PMH including but not limited to chronic atrial fibrillation on Xarelto, chronic mesenteric ischemia (s/p celiac and SMA stenting and subsequent thrombectomy for occluded stents 09/15/2020), CAD s/p RCA stenting, T2DM, HTN, HLD, anemia of chronic disease, depression/anxiety, and OSA on CPAP.   Clinical Impression  Pt presented supine in bed with HOB elevated, awake and willing to participate in therapy session. Prior to admission, pt reported that he ambulated with use of a cane PRN and was independent with ADLs. Pt lives with his wife in a single level home with a few steps to enter. At the time of evaluation, pt performing bed mobility independently, transfers with supervision and ambulated in hallway with min guard for safety while pushing his IV pole. He was limited with ambulation distance and endurance secondary to abdominal pain. PT will continue to follow pt acutely while admitted to progress mobility as tolerated to ensure a safe d/c home.       Recommendations for follow up therapy are one component of a multi-disciplinary discharge planning process, led by the attending physician.  Recommendations may be updated based on patient status, additional functional criteria and insurance authorization.  Follow Up Recommendations No PT follow up    Assistance Recommended at Discharge    Functional Status Assessment Patient has had a recent decline in their functional status and demonstrates the ability to make significant improvements in function in a reasonable and predictable amount of time.  Equipment Recommendations  None recommended by PT    Recommendations for Other Services       Precautions / Restrictions  Precautions Precautions: Fall Restrictions Weight Bearing Restrictions: No      Mobility  Bed Mobility Overal bed mobility: Independent                  Transfers Overall transfer level: Needs assistance Equipment used: None Transfers: Sit to/from Stand Sit to Stand: Supervision           General transfer comment: for safety    Ambulation/Gait Ambulation/Gait assistance: Min guard Gait Distance (Feet): 100 Feet Assistive device: IV Pole Gait Pattern/deviations: Step-through pattern;Decreased stride length Gait velocity: decreased     General Gait Details: pt with slow, cautious and guarded gait, pushing IV pole with L UE and reaching for hand rails in hallway with R UE. No overt LOB or need for physical assistance, min guard for safety  Stairs            Wheelchair Mobility    Modified Rankin (Stroke Patients Only)       Balance Overall balance assessment: Needs assistance Sitting-balance support: Feet supported Sitting balance-Leahy Scale: Good     Standing balance support: During functional activity;Bilateral upper extremity supported;Single extremity supported Standing balance-Leahy Scale: Poor                               Pertinent Vitals/Pain Pain Assessment: 0-10 Pain Score: 7  Pain Location: abdomen Pain Descriptors / Indicators: Discomfort Pain Intervention(s): Monitored during session;Repositioned    Home Living Family/patient expects to be discharged to:: Private residence Living Arrangements: Spouse/significant other Available Help at Discharge: Family Type of Home: House Home Access: Stairs to enter   [employer] of Steps:  3   Home Layout: One level Home Equipment: Cane - single point      Prior Function Prior Level of Function : Independent/Modified Independent             Mobility Comments: ambulates with a cane PRN       Hand Dominance        Extremity/Trunk Assessment    Upper Extremity Assessment Upper Extremity Assessment: Overall WFL for tasks assessed    Lower Extremity Assessment Lower Extremity Assessment: Generalized weakness       Communication   Communication: No difficulties  Cognition Arousal/Alertness: Awake/alert Behavior During Therapy: WFL for tasks assessed/performed Overall Cognitive Status: Within Functional Limits for tasks assessed                                          General Comments      Exercises     Assessment/Plan    PT Assessment Patient needs continued PT services  PT Problem List Decreased strength;Decreased activity tolerance;Decreased balance;Decreased coordination;Decreased mobility;Decreased knowledge of use of DME;Decreased safety awareness;Pain       PT Treatment Interventions DME instruction;Gait training;Stair training;Functional mobility training;Therapeutic activities;Balance training;Therapeutic exercise;Neuromuscular re-education;Patient/family education    PT Goals (Current goals can be found in the Care Plan section)  Acute Rehab PT Goals Patient Stated Goal: "home today" PT Goal Formulation: With patient Time For Goal Achievement: 02/03/21 Potential to Achieve Goals: Good    Frequency Min 3X/week   Barriers to discharge        Co-evaluation               AM-PAC PT "6 Clicks" Mobility  Outcome Measure Help needed turning from your back to your side while in a flat bed without using bedrails?: None Help needed moving from lying on your back to sitting on the side of a flat bed without using bedrails?: None Help needed moving to and from a bed to a chair (including a wheelchair)?: None Help needed standing up from a chair using your arms (e.g., wheelchair or bedside chair)?: None Help needed to walk in hospital room?: A Little Help needed climbing 3-5 steps with a railing? : A Little 6 Click Score: 22    End of Session   Activity Tolerance: Patient tolerated  treatment well Patient left: in bed;with call bell/phone within reach Nurse Communication: Mobility status PT Visit Diagnosis: Other abnormalities of gait and mobility (R26.89)    Time: 7416-3845 PT Time Calculation (min) (ACUTE ONLY): 12 min   Charges:   PT Evaluation $PT Eval Low Complexity: 1 Low          Eduard Clos, PT, DPT  Acute Rehabilitation Services Office [place] 01/20/2021, 9:42 AM

## 2021-01-20 NOTE — Progress Notes (Signed)
Patients HR up to 120s-130s while using the bathroom this morning, back down to WNL when he got back in bed.

## 2021-01-20 NOTE — Plan of Care (Signed)
  Problem: Education: Goal: Knowledge of General Education information will improve Description Including pain rating scale, medication(s)/side effects and non-pharmacologic comfort measures Outcome: Progressing   

## 2021-01-20 NOTE — Progress Notes (Addendum)
PROGRESS NOTE    Benjamin Fox  TZG:[id number] DOB: 1955-11-04 DOA: 01/16/2021 PCP: [place], Julie M, NP   Brief Narrative:  65 year old M with PMH of pancreatitis, chronic A. fib on Xarelto, chronic mesenteric ischemia s/p celiac and SMA stenting and subsequent thrombectomy for occluded stents 09/15/2020, CAD s/p RCA stenting, T2DM, HTN, HLD, anemia of chronic disease, depression/anxiety, and OSA on CPAP who presented to the ED for evaluation of abdominal pain, and admitted for acute uncomplicated pancreatitis.  Denies history of EtOH.  LFT within normal.  IR consulted and recommended CTA.  CTA abdomen and pelvis showed patent celiac and SMA stents with some narrowing due to atherosclerotic plaque, marked duodenitis and mild peripancreatic fat stranding along the proximal pancreas.  VVS  consulted and have very low suspicion for acute mesenteric ischemia.  Patient received IV Zosyn, and started on IV fluid, Zofran and morphine and admitted.  Assessment & Plan:   Principal Problem:   Acute pancreatitis Active Problems:   Chronic atrial fibrillation (HCC)   Coronary artery disease   Hyperlipidemia associated with type 2 diabetes mellitus (HCC)   Hypertension associated with diabetes (HCC)   OSA (obstructive sleep apnea)   Chronic mesenteric ischemia (HCC)   Type 2 diabetes mellitus ([place])   Duodenitis  Acute pancreatitis/duodenitis: Noted on CTA.  LFT within normal.  Denies alcohol.    Recent triglycerides 62.  Calcium within normal.  He states that his pain is improved, 6 out of 10.  He is still tender at right upper quadrant with positive Murphy sign but surprisingly, ultrasound abdomen negative for gallbladder wall thickening or acute cholecystitis.  He has some tiny polyps.  His pain and tenderness could be due to do tinnitus itself.  He is afebrile, procalcitonin unremarkable.  Tolerating soft diet, he is requesting to advance his diet so I will do that.  Continue current pain  management.  Continue IV PPI.  Chronic mesenteric ischemia: s/p celiac and SMA stenting in March 2022 at outside hospital. IR and vascular surgery have evaluated patient while in the ED. CTA largely reassuring from vascular standpoint.  Continue statin but Xarelto and Plavix is on hold for acute blood loss anemia.   Persistent atrial fibrillation: Irregular rhythm but rate controlled.  Continue Cardizem but Xarelto on hold.   CAD s/p RCA stenting: No cardiopulmonary symptoms. -Continue statin, Coreg.  Holding Plavix.   Anemia of chronic disease/iron deficiency anemia: His baseline hemoglobin is between 8 and 9.  Hemoglobin dropped to 6.8 on 01/19/2021, patient was hesitant to accept blood transfusion but eventually he agreed after I talked to his daughter, 1 unit of PRBC transfused, posttransfusion hemoglobin 7.8 but then dropped again back down to 7.1 this morning.  We will recheck this afternoon and transfuse if less than 7.  FOBT x1 negative.  We will check again.  Continue to hold Plavix and Xarelto.  Source remains unclear.  Continue PPI.  Daughter informs that patient has known history of B12 deficiency however his B12 is above normal here.  DM-2 with hyperglycemia and hyperlipidemia: On metformin at home.  Blood sugar controlled on SSI.  Continue statin.   Hypertension: Very well controlled. -Continue home Coreg, diltiazem, lisinopril.   Depression/anxiety: Stable. -Continue sertraline.   OSA: -Continue CPAP nightly.   Hypokalemia: Resolved  DVT prophylaxis:    Code Status: Full Code  Family Communication:  None present at bedside.  Plan of care discussed with patient in length and he verbalized understanding and agreed with it.  Plan  of care discussed with the daughter yesterday as well.  Status is: Inpatient  Remains inpatient appropriate because: Needs work-up for anemia.  Estimated body mass index is 36.65 kg/m as calculated from the following:   Height as of this  encounter: 6\' 1"  (1.854 m).   Weight as of this encounter: 126 kg.  Nutritional Assessment: Body mass index is 36.65 kg/m.Marland Kitchen Seen by dietician.  I agree with the assessment and plan as outlined below: Nutrition Status:  Skin Assessment: I have examined the patient's skin and I agree with the wound assessment as performed by the wound care RN as outlined below:    Consultants:  Vascular surgery-signed off IR-signed off  Procedures:  None  Antimicrobials:  Anti-infectives (From admission, onward)    Start     Dose/Rate Route Frequency Ordered Stop   01/18/21 2000  piperacillin-tazobactam (ZOSYN) IVPB 3.375 g        3.375 g 12.5 mL/hr over 240 Minutes Intravenous Every 8 hours 01/18/21 1515     01/17/21 1430  Ampicillin-Sulbactam (UNASYN) 3 g in sodium chloride 0.9 % 100 mL IVPB  Status:  Discontinued        3 g 200 mL/hr over 30 Minutes Intravenous Every 6 hours 01/17/21 1339 01/18/21 1501   01/16/21 1930  piperacillin-tazobactam (ZOSYN) IVPB 3.375 g        3.375 g 100 mL/hr over 30 Minutes Intravenous  Once 01/16/21 1923 01/16/21 2304          Subjective:  Patient seen and examined.  Still complains of abdominal pain but improving, 6 out of 10.  No other complaint.  Tolerating soft diet.  Objective: Vitals:   01/19/21 2140 01/19/21 2300 01/20/21 0421 01/20/21 0500  BP: (!) 148/96  (!) 154/75   Pulse:   (!) 102 92  Resp: 20  18   Temp: (!) 97.1 F (36.2 C)  (!) 97.5 F (36.4 C)   TempSrc: Oral  Oral   SpO2:  94% 92%   Weight:   126 kg   Height:        Intake/Output Summary (Last 24 hours) at 01/20/2021 0811 Last data filed at 01/20/2021 0729 Gross per 24 hour  Intake 4650.11 ml  Output 950 ml  Net 3700.11 ml    Filed Weights   01/18/21 0445 01/19/21 0401 01/20/21 0421  Weight: 122.9 kg 125.6 kg 126 kg    Examination:  General exam: Appears calm and comfortable, morbidly obese Respiratory system: Clear to auscultation. Respiratory effort  normal. Cardiovascular system: S1 & S2 heard, RRR. No JVD, murmurs, rubs, gallops or clicks. No pedal edema. Gastrointestinal system: Abdomen is nondistended, soft and epigastric and more pronounced right upper quadrant as well as right lower quadrant tenderness. No organomegaly or masses felt. Normal bowel sounds heard. Central nervous system: Alert and oriented. No focal neurological deficits. Extremities: Symmetric 5 x 5 power. Skin: No rashes, lesions or ulcers.  Psychiatry: Judgement and insight appear normal. Mood & affect appropriate.    Data Reviewed: I have personally reviewed following labs and imaging studies  CBC: Recent Labs  Lab 01/16/21 0805 01/17/21 0524 01/18/21 0924 01/18/21 1409 01/19/21 0911 01/19/21 2114 01/20/21 0416  WBC 11.6* 10.6* 8.1  --  7.5  --  8.3  NEUTROABS  --   --  6.5  --  6.3  --  6.7  HGB 8.6* 8.1* 6.9* 7.2* 6.8* 7.7* 7.1*  HCT 29.7* 28.3* 23.2* 24.2* 23.1* 25.1* 23.4*  MCV 83.9 84.2 82.6  --  83.1  --  83.0  PLT 258 263 204  --  216  --  284    Basic Metabolic Panel: Recent Labs  Lab 01/16/21 0805 01/17/21 0524 01/17/21 0930 01/18/21 0132 01/19/21 0911 01/20/21 0416  NA 139 138  --  140 138 140  K 3.5 3.4*  --  4.0 3.4* 3.6  CL 107 108  --  108 106 108  CO2 22 22  --  24 25 25   GLUCOSE 149* 125*  --  112* 159* 117*  BUN 19 16  --  22 27* 25*  CREATININE 0.83 0.79  --  0.88 0.97 0.99  CALCIUM 9.0 8.5*  --  8.5* 8.1* 8.2*  MG  --   --  1.7 2.1  --  1.8  PHOS  --   --   --  2.3*  --   --     GFR: Estimated Creatinine Clearance: 100.7 mL/min (by C-G formula based on SCr of 0.99 mg/dL). Liver Function Tests: Recent Labs  Lab 01/16/21 0805 01/17/21 0524 01/18/21 0132  AST 15 14* 11*  ALT 11 9 7   ALKPHOS 68 57 50  BILITOT 0.8 1.0 1.0  PROT 7.4 7.0 6.0*  ALBUMIN 3.6 3.4* 2.8*    Recent Labs  Lab 01/16/21 0805 01/17/21 0930 01/18/21 0132  LIPASE 445* 129* 122*    No results for input(s): AMMONIA in the last 168  hours. Coagulation Profile: No results for input(s): INR, PROTIME in the last 168 hours. Cardiac Enzymes: No results for input(s): CKTOTAL, CKMB, CKMBINDEX, TROPONINI in the last 168 hours. BNP (last 3 results) No results for input(s): PROBNP in the last 8760 hours. HbA1C: Recent Labs    01/18/21 0132  HGBA1C 5.9*    CBG: Recent Labs  Lab 01/19/21 1617 01/19/21 2125 01/20/21 0001 01/20/21 0418 01/20/21 0728  GLUCAP 129* 144* 126* 121* 122*    Lipid Profile: No results for input(s): CHOL, HDL, LDLCALC, TRIG, CHOLHDL, LDLDIRECT in the last 72 hours. Thyroid Function Tests: No results for input(s): TSH, T4TOTAL, FREET4, T3FREE, THYROIDAB in the last 72 hours. Anemia Panel: Recent Labs    01/18/21 1409  VITAMINB12 1,103*  FOLATE 8.6  FERRITIN 34  TIBC 496*  IRON 21*  RETICCTPCT 2.4    Sepsis Labs: Recent Labs  Lab 01/16/21 1611 01/17/21 1351 01/17/21 1633 01/18/21 0924  PROCALCITON  --   --   --  0.10  LATICACIDVEN 1.1 1.0 1.2  --      Recent Results (from the past 240 hour(s))  Resp Panel by RT-PCR (Flu A&B, Covid) Nasopharyngeal Swab     Status: None   Collection Time: 01/16/21 10:34 PM   Specimen: Nasopharyngeal Swab; Nasopharyngeal(NP) swabs in vial transport medium  Result Value Ref Range Status   SARS Coronavirus 2 by RT PCR NEGATIVE NEGATIVE Final    Comment: (NOTE) SARS-CoV-2 target nucleic acids are NOT DETECTED.  The SARS-CoV-2 RNA is generally detectable in upper respiratory specimens during the acute phase of infection. The lowest concentration of SARS-CoV-2 viral copies this assay can detect is 138 copies/mL. A negative result does not preclude SARS-Cov-2 infection and should not be used as the sole basis for treatment or other patient management decisions. A negative result may occur with  improper specimen collection/handling, submission of specimen other than nasopharyngeal swab, presence of viral mutation(s) within the areas targeted  by this assay, and inadequate number of viral copies(<138 copies/mL). A negative result must be combined with clinical observations, patient history,  and epidemiological information. The expected result is Negative.  Fact Sheet for Patients:  EntrepreneurPulse.com.au  Fact Sheet for Healthcare Providers:  IncredibleEmployment.be  This test is no t yet approved or cleared by the [place] FDA and  has been authorized for detection and/or diagnosis of SARS-CoV-2 by FDA under an Emergency Use Authorization (EUA). This EUA will remain  in effect (meaning this test can be used) for the duration of the COVID-19 declaration under Section 564(b)(1) of the Act, 21 U.S.C.section 360bbb-3(b)(1), unless the authorization is terminated  or revoked sooner.       Influenza A by PCR NEGATIVE NEGATIVE Final   Influenza B by PCR NEGATIVE NEGATIVE Final    Comment: (NOTE) The Xpert Xpress SARS-CoV-2/FLU/RSV plus assay is intended as an aid in the diagnosis of influenza from Nasopharyngeal swab specimens and should not be used as a sole basis for treatment. Nasal washings and aspirates are unacceptable for Xpert Xpress SARS-CoV-2/FLU/RSV testing.  Fact Sheet for Patients: EntrepreneurPulse.com.au  Fact Sheet for Healthcare Providers: IncredibleEmployment.be  This test is not yet approved or cleared by the [place] FDA and has been authorized for detection and/or diagnosis of SARS-CoV-2 by FDA under an Emergency Use Authorization (EUA). This EUA will remain in effect (meaning this test can be used) for the duration of the COVID-19 declaration under Section 564(b)(1) of the Act, 21 U.S.C. section 360bbb-3(b)(1), unless the authorization is terminated or revoked.  Performed at [facility], [place] [address] [place], [place] [address]        Radiology Studies: DG Abd 1 View  Result Date:  01/19/2021 CLINICAL DATA:  Abdominal pain and distension.  Vomiting. EXAM: ABDOMEN - 1 VIEW COMPARISON:  CTA abdomen/pelvis 01/16/2021 FINDINGS: Mild gaseous distention of small bowel and colon identified in a nonspecific pattern. No evidence for overt small bowel obstruction. Multiple phleboliths are seen over the anatomic pelvis. IMPRESSION: Nonspecific bowel gas pattern. Electronically Signed   By: Misty Stanley M.D.   On: 01/19/2021 13:33   US Abdomen Limited RUQ (LIVER/GB)  Result Date: 01/19/2021 CLINICAL DATA:  Acute cholecystitis, small-bowel obstruction EXAM: ULTRASOUND ABDOMEN LIMITED RIGHT UPPER QUADRANT COMPARISON:  CT angio abdomen and pelvis 01/16/2021 FINDINGS: Gallbladder: Normally distended with normal wall thickness. Two tiny nonshadowing echogenic foci of within gallbladder likely tiny polyps measuring 4 mm and 3 mm in greatest sizes. No definite shadowing calculi, pericholecystic fluid, or sonographic Murphy sign. Common bile duct: Diameter: 4 mm Liver: Mildly lobulated contour. Slightly heterogeneous echogenicity. No discrete hepatic mass. Portal vein is patent on color Doppler imaging with normal direction of blood flow towards the liver. Other: Small RIGHT pleural effusion. RIGHT renal cysts as noted on CT. Trace RIGHT upper quadrant free fluid. IMPRESSION: Tiny gallbladder polyps. RIGHT pleural effusion and trace perihepatic ascites. Slightly lobulated hepatic contour without discrete mass. Electronically Signed   By: Lavonia Dana M.D.   On: 01/19/2021 12:43    Scheduled Meds:  sodium chloride   Intravenous Once   atorvastatin  5 mg Oral BID   carvedilol  25 mg Oral BID   diltiazem  240 mg Oral q morning   insulin aspart  0-9 Units Subcutaneous Q4H   lisinopril  30 mg Oral BID   pantoprazole (PROTONIX) IV  40 mg Intravenous Q12H   sertraline  100 mg Oral QPM   sodium chloride flush  3 mL Intravenous Q12H   Continuous Infusions:  sodium chloride Stopped (01/20/21 0507)    lactated ringers 125 mL/hr at 01/20/21  0600   piperacillin-tazobactam (ZOSYN)  IV 12.5 mL/hr at 01/20/21 0600     LOS: 3 days   Time spent: 28 minutes   Darliss Cheney, MD [facility]  01/20/2021, 8:11 AM  Please page via [place] and do not message via secure chat for anything urgent. Secure chat can be used for anything non urgent.  How to contact the [facility] Attending or Consulting provider [place] or covering provider during after hours [place], for this patient?  Check the care team in [facility] and look for a) attending/consulting [facility] provider listed and b) the [facility] team listed. Page or secure chat [phone]. Log into [email or web address] and use [place]'s universal password to access. If you do not have the password, please contact the hospital operator. Locate the [facility] provider you are looking for under [facility] and page to a number that you can be directly reached. If you still have difficulty reaching the provider, please page the [facility] (Director on Call) for the Hospitalists listed on amion for assistance.

## 2021-01-21 DIAGNOSIS — K859 Acute pancreatitis without necrosis or infection, unspecified: Secondary | ICD-10-CM | POA: Diagnosis not present

## 2021-01-21 LAB — CBC
HCT: 24.2 % — ABNORMAL LOW (ref 39.0–52.0)
Hemoglobin: 7.2 g/dL — ABNORMAL LOW (ref 13.0–17.0)
MCH: 24.7 pg — ABNORMAL LOW (ref 26.0–34.0)
MCHC: 29.8 g/dL — ABNORMAL LOW (ref 30.0–36.0)
MCV: 82.9 fL (ref 80.0–100.0)
Platelets: 227 10*3/uL (ref 150–400)
RBC: 2.92 MIL/uL — ABNORMAL LOW (ref 4.22–5.81)
RDW: 19.4 % — ABNORMAL HIGH (ref 11.5–15.5)
WBC: 9.2 10*3/uL (ref 4.0–10.5)
nRBC: 0.4 % — ABNORMAL HIGH (ref 0.0–0.2)

## 2021-01-21 LAB — GLUCOSE, CAPILLARY
Glucose-Capillary: 119 mg/dL — ABNORMAL HIGH (ref 70–99)
Glucose-Capillary: 125 mg/dL — ABNORMAL HIGH (ref 70–99)
Glucose-Capillary: 125 mg/dL — ABNORMAL HIGH (ref 70–99)
Glucose-Capillary: 128 mg/dL — ABNORMAL HIGH (ref 70–99)

## 2021-01-21 MED ORDER — SODIUM CHLORIDE 0.9 % IV SOLN
510.0000 mg | Freq: Once | INTRAVENOUS | Status: AC
  Administered 2021-01-21: 11:00:00 510 mg via INTRAVENOUS
  Filled 2021-01-21: qty 17

## 2021-01-21 MED ORDER — OXYCODONE HCL 5 MG PO TABS: 5.0000 mg | ORAL_TABLET | ORAL | 0 refills | Status: AC | PRN

## 2021-01-21 MED ORDER — ATORVASTATIN CALCIUM 10 MG PO TABS: 10.0000 mg | ORAL_TABLET | Freq: Every day | ORAL | Status: AC

## 2021-01-21 NOTE — Discharge Summary (Signed)
Physician Discharge Summary  Benjamin Fox WSF:[id number] DOB: 1955/03/06 DOA: 01/16/2021  PCP: Martinique, Julie M, NP  Admit date: 01/16/2021 Discharge date: 01/21/2021  Admitted From: Home Disposition: Home  Recommendations for Outpatient Follow-up:  Follow up with PCP in 1-2 weeks Please obtain BMP/CBC in one week Schedule follow-up with your gastroenterologist about abdominal pain and follow-up regarding pancreatitis.  Home Health: N/A Equipment/Devices: N/A  Discharge Condition: Stable CODE STATUS: Full code Diet recommendation: Low-salt diet, low-carb diet.  Discharge summary:  65 year old gentleman with history of chronic A. fib on Xarelto, chronic mesenteric ischemia status post celiac and SMA stenting and subsequent thrombectomy for occluded stents 08/2020, coronary artery disease status post RCA stenting, type 2 diabetes, hypertension hyperlipidemia, depression anxiety, OSA on CPAP, history of pancreatitis and recently treated appendicular abscess presented to the emergency room with diffuse abdominal pain mostly epigastric abdominal pain and found to have acute uncomplicated pancreatitis.  CT angiogram was done that showed patent celiac and SMA stents, atherosclerotic plaque, marked duodenitis and mild peripancreatic fatty stranding along the proximal pancreas.  Patient was hemodynamically stable.  Acute mesenteric ischemia was ruled out.  Patient was admitted to the hospital and treated symptomatically for acute pancreatitis/duodenitis.  LFTs normal.  No alcoholism.  Triglyceride normal.  Pain improved with IV fluids and now tolerating regular diet.  Ultrasound abdomen negative for gallbladder disease.  LFTs normalized.  Lipase normalized. Does have evidence of duodenitis, currently on PPI.  Continue.  With resolved symptoms, he is going home and continue PPI.  He does have a gastroenterology follow-up at [facility].  Chronic mesenteric ischemia: Status post celiac and  SMA stenting.  CTA was a searing.  Continue Xarelto and Plavix.  See anemia below.  Anemia of chronic disease/chronic iron deficiency anemia: Apparent baseline hemoglobin 7.2-8.  Hemoglobin dropped to 6.8 on 12/26 that is probably due to multiple interventions and recent hospitalizations and blood draws.  Received 1 unit of PRBC.  FOBT was negative.  B12 was normal.  Iron was low. Total 1 unit of PRBC transfusion Given 1 unit of Feraheme on the day of discharge. Does not have any evidence of ongoing bleeding, may have chronic GI loss.  Will have follow-up with his gastroenterologist. Benefits outweigh risks.  Go back on Xarelto and Plavix with close follow-up on hemoglobin. Hemoglobin 7.2 today.  Please recheck in 1 week to ensure stabilization.  Chronic medical issues including hypertension, type 2 diabetes/depression anxiety are stable.  Uses CPAP at home.   Discharge Diagnoses:  Principal Problem:   Acute pancreatitis Active Problems:   Chronic atrial fibrillation (HCC)   Coronary artery disease   Hyperlipidemia associated with type 2 diabetes mellitus (HCC)   Hypertension associated with diabetes (HCC)   OSA (obstructive sleep apnea)   Chronic mesenteric ischemia (HCC)   Type 2 diabetes mellitus ([place])   Duodenitis    Discharge Instructions  Discharge Instructions     Call MD for:  persistant nausea and vomiting   Complete by: As directed    Call MD for:  severe uncontrolled pain   Complete by: As directed    Diet - low sodium heart healthy   Complete by: As directed    Diet Carb Modified   Complete by: As directed    Increase activity slowly   Complete by: As directed       Allergies as of 01/21/2021   No Known Allergies      Medication List     TAKE these medications  acetaminophen 500 MG tablet Commonly known as: TYLENOL Take 2 tablets (1,000 mg total) by mouth every 6 (six) hours as needed for mild pain (or Fever >/= 101).   atorvastatin 10 MG  tablet Commonly known as: LIPITOR Take 1 tablet (10 mg total) by mouth daily. What changed:  how much to take when to take this   carvedilol 25 MG tablet Commonly known as: COREG Take 25 mg by mouth 2 (two) times daily.   clopidogrel 75 MG tablet Commonly known as: PLAVIX Take 1 tablet (75 mg total) by mouth daily. What changed: when to take this   Coenzyme Q10 100 MG capsule Take 100 mg by mouth every morning.   Cyanocobalamin 5000 MCG Subl Place 5,000 mcg under the tongue every morning.   diltiazem 240 MG 24 hr capsule Commonly known as: CARDIZEM CD Take 240 mg by mouth every morning.   fenofibrate 145 MG tablet Commonly known as: TRICOR Take 145 mg by mouth every morning.   furosemide 20 MG tablet Commonly known as: LASIX Take 10 mg by mouth every morning.   lisinopril 30 MG tablet Commonly known as: ZESTRIL Take 30 mg by mouth 2 (two) times daily.   metFORMIN 500 MG tablet Commonly known as: GLUCOPHAGE Take 500 mg by mouth 2 (two) times daily.   oxyCODONE 5 MG immediate release tablet Commonly known as: Oxy IR/ROXICODONE Take 1 tablet (5 mg total) by mouth every 4 (four) hours as needed for moderate pain.   pantoprazole 40 MG tablet Commonly known as: PROTONIX Take 40 mg by mouth 2 (two) times daily.   rivaroxaban 20 MG Tabs tablet Commonly known as: XARELTO Take 20 mg by mouth every evening.   sertraline 100 MG tablet Commonly known as: ZOLOFT Take 100 mg by mouth every evening.        Follow-up Information     Martinique, Julie M, NP Follow up in 2 week(s).   Specialty: Nurse Practitioner Contact information: [place] [address] [phone]                No Known Allergies  Consultations: None   Procedures/Studies: DG Abd 1 View  Result Date: 01/19/2021 CLINICAL DATA:  Abdominal pain and distension.  Vomiting. EXAM: ABDOMEN - 1 VIEW COMPARISON:  CTA abdomen/pelvis 01/16/2021 FINDINGS: Mild  gaseous distention of small bowel and colon identified in a nonspecific pattern. No evidence for overt small bowel obstruction. Multiple phleboliths are seen over the anatomic pelvis. IMPRESSION: Nonspecific bowel gas pattern. Electronically Signed   By: Misty Stanley M.D.   On: 01/19/2021 13:33   [place] MESENTERIC ARTERIES  Result Date: 01/08/2021 CLINICAL DATA:  65 year old male with history of chronic mesenteric ischemia and SMA stent recanalization on 09/15/2020. EXAM: [place] MESENTERIC ARTERIAL DOPPLER COMPARISON:  10/21/2020, 09/15/2020 FINDINGS: Celiac axis: The indwelling proximal stent remains occluded. 140 cm/sec in the midportion, 191 centimeters/second distally. Splenic artery: 175 cm/sec Hepatic artery: 276 cm/sec SMA: 287 cm/sec within the indwelling stent proximally, 167 centimeters/second in the midportion, 88 centimeters/second distally. IMA: Not well visualized. Aorta: 134 cm/sec Aortic size: Unable to measure due to overlying bowel gas. IMPRESSION: Patent indwelling SMA stent. The distal SMA remains patent. Persistent chronic occlusion of the indwelling celiac stent with patent celiac artery distally. Ruthann Cancer, MD [facility] Electronically Signed   By: Ruthann Cancer M.D.   On: 01/08/2021 08:55   IR Radiologist Eval & Mgmt  Result Date: 01/08/2021 Please refer  to notes tab for details about interventional procedure. (Op Note)  CT Angio Abd/Pel W and/or Wo Contrast  Result Date: 01/16/2021 CLINICAL DATA:  Mesenteric ischemia, acute history of mesenteric ischemia s/p stent. acute abdominal pain EXAM: CTA ABDOMEN AND PELVIS WITHOUT AND WITH CONTRAST TECHNIQUE: Multidetector CT imaging of the abdomen and pelvis was performed using the standard protocol during bolus administration of intravenous contrast. Multiplanar reconstructed images and MIPs were obtained and reviewed to evaluate the vascular anatomy. CONTRAST:  175mL OMNIPAQUE  IOHEXOL 350 MG/ML SOLN COMPARISON:  CT abdomen pelvis 09/15/2020 FINDINGS: VASCULAR No intravenous contrast extravasation to suggest active bleed. Aorta: Severe atherosclerotic plaque. Normal caliber aorta without aneurysm, dissection, vasculitis or significant stenosis. Celiac: Stent of the origin of the celiac artery is patent but narrowed due to atherosclerotic plaque. Otherwise patent without evidence of aneurysm, dissection, vasculitis or significant stenosis. SMA: Extent of the origin of the superior mesenteric artery is patent but narrowed due to atherosclerotic plaque. Patent without evidence of aneurysm, dissection, vasculitis or significant stenosis. Renals: At least mild atherosclerotic plaque. Both renal arteries are patent without evidence of aneurysm, dissection, vasculitis, fibromuscular dysplasia or significant stenosis. IMA: Patent without evidence of aneurysm, dissection, vasculitis or significant stenosis. Inflow: Mild to moderate atherosclerotic plaque. Patent without evidence of aneurysm, dissection, vasculitis or significant stenosis. Proximal Outflow: Bilateral common femoral and visualized portions of the superficial and profunda femoral arteries are patent without evidence of aneurysm, dissection, vasculitis or significant stenosis. Veins: The main portal, splenic, superior mesenteric veins are patent. No portal or mesenteric venous gas. Review of the MIP images confirms the above findings. NON-VASCULAR Lower chest: Trace volume right pleural effusion. Coronary artery calcifications. Hepatobiliary: No focal liver abnormality. No gallstones, gallbladder wall thickening, or pericholecystic fluid. No biliary dilatation. Pancreas: No focal lesion. Normal pancreatic contour. Mild peripancreatic fat stranding along the proximal pancreas which may be due to the adjacent duodenum. No main pancreatic ductal dilatation. Spleen: Normal in size without focal abnormality. Punctate calcifications likely  sequelae of prior granulomatous disease. Adrenals/Urinary Tract: No adrenal nodule bilaterally. Bilateral kidneys enhance symmetrically. Fluid density lesions likely represent simple renal cysts. Subcentimeter hypodensities are too small to characterize. No hydronephrosis. No hydroureter. The urinary bladder is unremarkable. Stomach/Bowel: Circumferential diffuse bowel wall thickening of the 2nd-4th portion of the duodenum with associated marked duodenal fat stranding and surrounding mesenteric edema. No pneumatosis. The duodenal bowel wall does not enhance well; this however may be due to timing of contrast on the portal venous phase. No bowel dilatation. The large bowel demonstrates no bowel dilatation or bowel wall thickening. The appendix is unremarkable. Lymphatic: No lymphadenopathy. Reproductive: Prostate is unremarkable. Other: Trace free fluid within the abdomen pelvis. No intraperitoneal free gas. No organized fluid collection. Musculoskeletal: No abdominal wall hernia or abnormality. No suspicious lytic or blastic osseous lesions. No acute displaced fracture. Multilevel mild degenerative changes of the spine. IMPRESSION: VASCULAR 1. Patent celiac and superior mesenteric artery stents with some narrowing due to atherosclerotic plaque. 2. Aortic Atherosclerosis (ICD10-I70.0) including coronary artery calcifications. 3. No intravenous contrast extravasation to suggest active bleed. NON-VASCULAR 1. Marked duodenitis. Bowel ischemia is not excluded given extent of inflammation and poorly enhancing duodenal wall - this may be due to timing of contrast on the portal venous phase. Correlate with lactate levels. 2. Mild peripancreatic fat stranding along the proximal pancreas which may be due to the adjacent duodenal inflammation with no definite hazy pancreatic contour and no poorly defined/edematous pancreatic parenchyma. 3. Trace right pleural effusion. These  results were called by telephone at the time of  interpretation on 01/16/2021 at 5:41 pm to provider Dr. Sabra Heck, who verbally acknowledged these results. Electronically Signed   By: Iven Finn M.D.   On: 01/16/2021 17:52   US Abdomen Limited RUQ (LIVER/GB)  Result Date: 01/19/2021 CLINICAL DATA:  Acute cholecystitis, small-bowel obstruction EXAM: ULTRASOUND ABDOMEN LIMITED RIGHT UPPER QUADRANT COMPARISON:  CT angio abdomen and pelvis 01/16/2021 FINDINGS: Gallbladder: Normally distended with normal wall thickness. Two tiny nonshadowing echogenic foci of within gallbladder likely tiny polyps measuring 4 mm and 3 mm in greatest sizes. No definite shadowing calculi, pericholecystic fluid, or sonographic Murphy sign. Common bile duct: Diameter: 4 mm Liver: Mildly lobulated contour. Slightly heterogeneous echogenicity. No discrete hepatic mass. Portal vein is patent on color Doppler imaging with normal direction of blood flow towards the liver. Other: Small RIGHT pleural effusion. RIGHT renal cysts as noted on CT. Trace RIGHT upper quadrant free fluid. IMPRESSION: Tiny gallbladder polyps. RIGHT pleural effusion and trace perihepatic ascites. Slightly lobulated hepatic contour without discrete mass. Electronically Signed   By: Lavonia Dana M.D.   On: 01/19/2021 12:43   (Echo, Carotid, EGD, Colonoscopy, ERCP)    Subjective: Patient was seen and examined.  No overnight events.  Slight soreness present but denies any nausea vomiting or abdominal pain.  Bowel movement 2 days ago.   Discharge Exam: Vitals:   01/20/21 1925 01/21/21 0408  BP: (!) 152/90 (!) 157/87  Pulse: 84 (!) 53  Resp: 19 18  Temp: 97.6 F (36.4 C) 98 F (36.7 C)  SpO2: 92% 97%   Vitals:   01/20/21 0500 01/20/21 1055 01/20/21 1925 01/21/21 0408  BP:  (!) 152/99 (!) 152/90 (!) 157/87  Pulse: 92  84 (!) 53  Resp:  20 19 18   Temp:  98.8 F (37.1 C) 97.6 F (36.4 C) 98 F (36.7 C)  TempSrc:  Oral Oral Oral  SpO2:   92% 97%  Weight:    129.7 kg  Height:        General: Pt  is alert, awake, not in acute distress Cardiovascular: RRR, S1/S2 +, no rubs, no gallops Respiratory: CTA bilaterally, no wheezing, no rhonchi Abdominal: Soft, NT, ND, bowel sounds +, obese and pendulous.  No localized tenderness. Extremities: no edema, no cyanosis    The results of significant diagnostics from this hospitalization (including imaging, microbiology, ancillary and laboratory) are listed below for reference.     Microbiology: Recent Results (from the past 240 hour(s))  Resp Panel by RT-PCR (Flu A&B, Covid) Nasopharyngeal Swab     Status: None   Collection Time: 01/16/21 10:34 PM   Specimen: Nasopharyngeal Swab; Nasopharyngeal(NP) swabs in vial transport medium  Result Value Ref Range Status   SARS Coronavirus 2 by RT PCR NEGATIVE NEGATIVE Final    Comment: (NOTE) SARS-CoV-2 target nucleic acids are NOT DETECTED.  The SARS-CoV-2 RNA is generally detectable in upper respiratory specimens during the acute phase of infection. The lowest concentration of SARS-CoV-2 viral copies this assay can detect is 138 copies/mL. A negative result does not preclude SARS-Cov-2 infection and should not be used as the sole basis for treatment or other patient management decisions. A negative result may occur with  improper specimen collection/handling, submission of specimen other than nasopharyngeal swab, presence of viral mutation(s) within the areas targeted by this assay, and inadequate number of viral copies(<138 copies/mL). A negative result must be combined with clinical observations, patient history, and epidemiological information. The expected result is Negative.  Fact Sheet for Patients:  EntrepreneurPulse.com.au  Fact Sheet for Healthcare Providers:  IncredibleEmployment.be  This test is no t yet approved or cleared by the [place] FDA and  has been authorized for detection and/or diagnosis of SARS-CoV-2 by FDA under an Emergency  Use Authorization (EUA). This EUA will remain  in effect (meaning this test can be used) for the duration of the COVID-19 declaration under Section 564(b)(1) of the Act, 21 U.S.C.section 360bbb-3(b)(1), unless the authorization is terminated  or revoked sooner.       Influenza A by PCR NEGATIVE NEGATIVE Final   Influenza B by PCR NEGATIVE NEGATIVE Final    Comment: (NOTE) The Xpert Xpress SARS-CoV-2/FLU/RSV plus assay is intended as an aid in the diagnosis of influenza from Nasopharyngeal swab specimens and should not be used as a sole basis for treatment. Nasal washings and aspirates are unacceptable for Xpert Xpress SARS-CoV-2/FLU/RSV testing.  Fact Sheet for Patients: EntrepreneurPulse.com.au  Fact Sheet for Healthcare Providers: IncredibleEmployment.be  This test is not yet approved or cleared by the [place] FDA and has been authorized for detection and/or diagnosis of SARS-CoV-2 by FDA under an Emergency Use Authorization (EUA). This EUA will remain in effect (meaning this test can be used) for the duration of the COVID-19 declaration under Section 564(b)(1) of the Act, 21 U.S.C. section 360bbb-3(b)(1), unless the authorization is terminated or revoked.  Performed at [facility], [place] [address], [place], [place] [address]      Labs: BNP (last 3 results) No results for input(s): BNP in the last 8760 hours. Basic Metabolic Panel: Recent Labs  Lab 01/16/21 0805 01/17/21 0524 01/17/21 0930 01/18/21 0132 01/19/21 0911 01/20/21 0416  NA 139 138  --  140 138 140  K 3.5 3.4*  --  4.0 3.4* 3.6  CL 107 108  --  108 106 108  CO2 22 22  --  24 25 25   GLUCOSE 149* 125*  --  112* 159* 117*  BUN 19 16  --  22 27* 25*  CREATININE 0.83 0.79  --  0.88 0.97 0.99  CALCIUM 9.0 8.5*  --  8.5* 8.1* 8.2*  MG  --   --  1.7 2.1  --  1.8  PHOS  --   --   --  2.3*  --   --    Liver Function Tests: Recent Labs  Lab 01/16/21 0805  01/17/21 0524 01/18/21 0132  AST 15 14* 11*  ALT 11 9 7   ALKPHOS 68 57 50  BILITOT 0.8 1.0 1.0  PROT 7.4 7.0 6.0*  ALBUMIN 3.6 3.4* 2.8*   Recent Labs  Lab 01/16/21 0805 01/17/21 0930 01/18/21 0132  LIPASE 445* 129* 122*   No results for input(s): AMMONIA in the last 168 hours. CBC: Recent Labs  Lab 01/17/21 0524 01/18/21 0924 01/18/21 1409 01/19/21 0911 01/19/21 2114 01/20/21 0416 01/20/21 1407 01/21/21 0247  WBC 10.6* 8.1  --  7.5  --  8.3  --  9.2  NEUTROABS  --  6.5  --  6.3  --  6.7  --   --   HGB 8.1* 6.9*   < > 6.8* 7.7* 7.1* 7.9* 7.2*  HCT 28.3* 23.2*   < > 23.1* 25.1* 23.4* 25.6* 24.2*  MCV 84.2 82.6  --  83.1  --  83.0  --  82.9  PLT 263 204  --  216  --  207  --  227   < > = values in this  interval not displayed.   Cardiac Enzymes: No results for input(s): CKTOTAL, CKMB, CKMBINDEX, TROPONINI in the last 168 hours. BNP: Invalid input(s): POCBNP CBG: Recent Labs  Lab 01/20/21 1625 01/20/21 2114 01/21/21 0016 01/21/21 0409 01/21/21 0757  GLUCAP 131* 126* 119* 125* 125*   D-Dimer No results for input(s): DDIMER in the last 72 hours. Hgb A1c No results for input(s): HGBA1C in the last 72 hours. Lipid Profile No results for input(s): CHOL, HDL, LDLCALC, TRIG, CHOLHDL, LDLDIRECT in the last 72 hours. Thyroid function studies No results for input(s): TSH, T4TOTAL, T3FREE, THYROIDAB in the last 72 hours.  Invalid input(s): FREET3 Anemia work up Recent Labs    01/18/21 1409  VITAMINB12 1,103*  FOLATE 8.6  FERRITIN 34  TIBC 496*  IRON 21*  RETICCTPCT 2.4   Urinalysis    Component Value Date/Time   COLORURINE YELLOW 01/16/2021 0749   APPEARANCEUR CLEAR 01/16/2021 0749   LABSPEC 1.015 01/16/2021 0749   PHURINE 5.0 01/16/2021 0749   GLUCOSEU NEGATIVE 01/16/2021 0749   HGBUR NEGATIVE 01/16/2021 0749   BILIRUBINUR NEGATIVE 01/16/2021 0749   KETONESUR NEGATIVE 01/16/2021 0749   PROTEINUR NEGATIVE 01/16/2021 0749   NITRITE NEGATIVE  01/16/2021 0749   LEUKOCYTESUR NEGATIVE 01/16/2021 0749   Sepsis Labs Invalid input(s): PROCALCITONIN,  WBC,  LACTICIDVEN Microbiology Recent Results (from the past 240 hour(s))  Resp Panel by RT-PCR (Flu A&B, Covid) Nasopharyngeal Swab     Status: None   Collection Time: 01/16/21 10:34 PM   Specimen: Nasopharyngeal Swab; Nasopharyngeal(NP) swabs in vial transport medium  Result Value Ref Range Status   SARS Coronavirus 2 by RT PCR NEGATIVE NEGATIVE Final    Comment: (NOTE) SARS-CoV-2 target nucleic acids are NOT DETECTED.  The SARS-CoV-2 RNA is generally detectable in upper respiratory specimens during the acute phase of infection. The lowest concentration of SARS-CoV-2 viral copies this assay can detect is 138 copies/mL. A negative result does not preclude SARS-Cov-2 infection and should not be used as the sole basis for treatment or other patient management decisions. A negative result may occur with  improper specimen collection/handling, submission of specimen other than nasopharyngeal swab, presence of viral mutation(s) within the areas targeted by this assay, and inadequate number of viral copies(<138 copies/mL). A negative result must be combined with clinical observations, patient history, and epidemiological information. The expected result is Negative.  Fact Sheet for Patients:  EntrepreneurPulse.com.au  Fact Sheet for Healthcare Providers:  IncredibleEmployment.be  This test is no t yet approved or cleared by the [place] FDA and  has been authorized for detection and/or diagnosis of SARS-CoV-2 by FDA under an Emergency Use Authorization (EUA). This EUA will remain  in effect (meaning this test can be used) for the duration of the COVID-19 declaration under Section 564(b)(1) of the Act, 21 U.S.C.section 360bbb-3(b)(1), unless the authorization is terminated  or revoked sooner.       Influenza A by PCR NEGATIVE NEGATIVE  Final   Influenza B by PCR NEGATIVE NEGATIVE Final    Comment: (NOTE) The Xpert Xpress SARS-CoV-2/FLU/RSV plus assay is intended as an aid in the diagnosis of influenza from Nasopharyngeal swab specimens and should not be used as a sole basis for treatment. Nasal washings and aspirates are unacceptable for Xpert Xpress SARS-CoV-2/FLU/RSV testing.  Fact Sheet for Patients: EntrepreneurPulse.com.au  Fact Sheet for Healthcare Providers: IncredibleEmployment.be  This test is not yet approved or cleared by the [place] FDA and has been authorized for detection and/or diagnosis of SARS-CoV-2 by FDA under  an Emergency Use Authorization (EUA). This EUA will remain in effect (meaning this test can be used) for the duration of the COVID-19 declaration under Section 564(b)(1) of the Act, 21 U.S.C. section 360bbb-3(b)(1), unless the authorization is terminated or revoked.  Performed at [facility], [place] [address] [place], [place] [address]      Time coordinating discharge:  32 minutes  SIGNED:   Barb Merino, MD  [facility] 01/21/2021, 10:08 AM

## 2021-01-21 NOTE — Plan of Care (Signed)
  Problem: Education: Goal: Knowledge of General Education information will improve Description: Including pain rating scale, medication(s)/side effects and non-pharmacologic comfort measures Outcome: Adequate for Discharge   

## 2021-01-21 NOTE — Progress Notes (Signed)
Discharge instructions (including medications) discussed with and copy provided to patient/caregiver 

## 2021-01-21 NOTE — Plan of Care (Signed)
Problem: Skin Integrity: °Goal: Risk for impaired skin integrity will decrease °Outcome: Completed/Met °  °Problem: Safety: °Goal: Ability to remain free from injury will improve °Outcome: Completed/Met °  °Problem: Elimination: °Goal: Will not experience complications related to urinary retention °Outcome: Completed/Met °  °Problem: Coping: °Goal: Level of anxiety will decrease °Outcome: Completed/Met °  °

## 2021-02-11 ENCOUNTER — Telehealth: Payer: Self-pay | Admitting: Gastroenterology

## 2021-02-11 NOTE — Telephone Encounter (Signed)
Spoke with patient and advise of recommendation. States he will speak with Dr. Serafina Royals and make a decision of how to proceed with care and will give [place] a call back   Thank you

## 2021-02-11 NOTE — Telephone Encounter (Signed)
Hey Dr. Rush Landmark,   We received a referral in our office for patient to be seen for pancreatitis complicated by a pseudocyst. I have the records as well as records from [facility]. Could you please review and advise on scheduling?  Thank you

## 2021-02-11 NOTE — Telephone Encounter (Signed)
I have reviewed the case with Dr. Bryan Lemma and Dr. Serafina Royals. 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 I am willing to evaluate the patient, but due to the overall complexity of his pancreas issues, he may best be served by maintaining GI care at one of the quaternary centers such as [facility] (where he has established previously) or potentially at [facility]. If he wants to meet me, I am happy to review his case, but I cannot guarantee that I will be able to help him. As well, chronic pain issues or pain management needs, will need to go through his already established doctors/PCP until he has a chance to meet [place].  Also, I do not provide chronic pain management if there is any question of that. Let us know what he decides. Thanks. GM

## 2021-03-20 ENCOUNTER — Other Ambulatory Visit: Payer: Self-pay | Admitting: Interventional Radiology

## 2021-03-20 DIAGNOSIS — K551 Chronic vascular disorders of intestine: Secondary | ICD-10-CM

## 2021-03-26 ENCOUNTER — Ambulatory Visit: Payer: BC Managed Care – PPO | Admitting: Gastroenterology

## 2021-03-26 ENCOUNTER — Encounter: Payer: Self-pay | Admitting: Gastroenterology

## 2021-03-26 VITALS — BP 108/62 | HR 75 | Ht 73.0 in | Wt 250.0 lb

## 2021-03-26 DIAGNOSIS — D649 Anemia, unspecified: Secondary | ICD-10-CM

## 2021-03-26 DIAGNOSIS — K861 Other chronic pancreatitis: Secondary | ICD-10-CM

## 2021-03-26 DIAGNOSIS — Z8719 Personal history of other diseases of the digestive system: Secondary | ICD-10-CM

## 2021-03-26 DIAGNOSIS — R195 Other fecal abnormalities: Secondary | ICD-10-CM

## 2021-03-26 DIAGNOSIS — R1013 Epigastric pain: Secondary | ICD-10-CM

## 2021-03-26 DIAGNOSIS — K8500 Idiopathic acute pancreatitis without necrosis or infection: Secondary | ICD-10-CM | POA: Diagnosis not present

## 2021-03-26 DIAGNOSIS — G8929 Other chronic pain: Secondary | ICD-10-CM

## 2021-03-26 NOTE — Patient Instructions (Addendum)
You have been scheduled for an MRI at [facility] on 04/03/21. Your appointment time is 4:00pm. Please arrive to admitting (at main entrance of the hospital) 30 minutes prior to your appointment time for registration purposes. Please make certain not to have anything to eat or drink 4 hours prior to your test. In addition, if you have any metal in your body, have a pacemaker or defibrillator, please be sure to let your ordering physician know. This test typically takes 45 minutes to 1 hour to complete. Should you need to reschedule, please call [phone] to do so. ? ?Your provider has requested labs be done in the next few weeks.Press "B" on the elevator. The lab is located at the first door on the left as you exit the elevator. ? ? ?Thank you for choosing me and [facility]. ? ?Dr. Rush Landmark ? ?

## 2021-03-27 ENCOUNTER — Encounter: Payer: Self-pay | Admitting: Gastroenterology

## 2021-03-27 DIAGNOSIS — R195 Other fecal abnormalities: Secondary | ICD-10-CM | POA: Insufficient documentation

## 2021-03-27 DIAGNOSIS — D649 Anemia, unspecified: Secondary | ICD-10-CM | POA: Insufficient documentation

## 2021-03-27 DIAGNOSIS — Z8719 Personal history of other diseases of the digestive system: Secondary | ICD-10-CM | POA: Insufficient documentation

## 2021-03-27 DIAGNOSIS — G8929 Other chronic pain: Secondary | ICD-10-CM | POA: Insufficient documentation

## 2021-03-27 DIAGNOSIS — K861 Other chronic pancreatitis: Secondary | ICD-10-CM | POA: Insufficient documentation

## 2021-03-27 NOTE — Progress Notes (Signed)
GASTROENTEROLOGY OUTPATIENT CLINIC VISIT   Primary Care Provider Martinique, Julie M, NP [address] [place] [address] [phone]  Referring Provider Dr. Serafina Royals  Patient Profile: Benjamin Fox is a 66 y.o. male with a pmh significant for atrial fibrillation (on anticoagulation), CAD, chronic mesenteric ischemia (status post stenting of SMA and CA - on Plavix), hypertension, hyperlipidemia, diabetes, OSA, status post prior gastric band (now removed), recurrent idiopathic pancreatitis (complicated by previous leak requiring PD stenting and PD sphincterotomy), previous pancreatic pseudoaneurysm appendiceal abscess (status post antibiotic therapy still with appendix), Anemia.  The patient presents to the [facility] for an evaluation and management of problem(s) noted below:  Problem List 1. Idiopathic acute pancreatitis without infection or necrosis   2. Chronic pancreatitis, unspecified pancreatitis type ([place])   3. Abdominal pain, chronic, epigastric   4. History of appendicitis   5. Anemia, unspecified type   6. Heme positive stool     History of Present Illness This is the patient's first visit to the outpatient [facility] clinic.  His GI history is quite complex.  I had an opportunity to review his chart a few weeks back and also again today.  Patient first had pancreatitis back in 2019.  He had multiple episodes.  The etiology was unclear.  He eventually underwent EUS with the [facility] group.  Etiology was unclear.  No evidence of chronic pancreatitis per report.  He was following with them until 2021.  He transitioned his care to [facility].  He saw Dr. Jerene Pitch and Dr. Delrae Alfred.  In 2022 he underwent repeat EUS and FNA for a 3 cm cystic area in the neck of the pancreas.  Interestingly the lipase was only 43 and CEA was 196.  I did not feel that this was consistent with an MCN at the time.  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  In April 2022 he underwent gastric band removal and at the time also had an abscess.  He had ulcer disease found on his endoscopy due to concerns of anemia.  And his surgical drain he was found to have an amylase of 73,000 and as result there was concern for pancreatic duct leakage.  Is at that point he underwent MRI imaging and subsequent follow-up imaging showed a significant enlargement in the cyst that then required an ERCP in May 2022.  He underwent a pancreatic sphincterotomy and PD stenting and also a biliary sphincterotomy.  Unfortunately he had progressive symptoms and had repeat imaging that showed smaller size of his prior collection but with partial migration of his stent.  He ended up being admitted to [facility] where you underwent a repeat ERCP with repeat stent placement.  After he was discharged does not look like he ever followed up with GI due to his concerns of his overall GI care at that place.  However is not clear that he ever underwent PD stent removal and is possible that his PD stent fell out on its own.  The patient eventually met Dr. Serafina Royals with interventional radiology here at [facility].  He underwent repeat imaging and eventually was actually found to have a potential anatomy that may allow repeat stenting of his SMA.  Imaging however before he ended up getting his procedure showed evidence of a appendiceal abscess  due to perforation.  He came into [facility] and was treated empirically with antibiotics and had no surgery.  The plan was for interval appendectomy at some point in the future but he has not followed up with surgery since then.  After his repeat SMA stenting, much of the patient's abdominal discomfort improved.  However he continues to have episodes of idiopathic pancreatitis.  He is not  sure that he wants to follow-up with a quaternary center of [facility] and as a result asked Dr. Serafina Royals for a possible referral and he is in my clinic today.  Unfortunately since January of this year he has had at least 2 episodes of acute pancreatitis.  The etiology remains unclear.  His last 1 was just within the last couple of weeks.  He is finally getting better in regards to his eating habits.  His last imaging study is at [facility] so I cannot see it but there is no overt sign of a significant PD dilation or significant peripancreatic fluid collection that requires drainage at this time.  Patient has had significant weight loss over the course of the last year though things are stabilizing after each of these events.  Imaging shows evidence of calcifications going back into 2022 within the head of the pancreas.  The patient does not take any NSAIDs or BC/Goody powders.  He has undergone work-up other than genetic testing with negative IgG4 and HIV testing in the past.  In December 2022 when he was admitted to [facility], he was found to have acute on chronic anemia with heme positive stool and required blood transfusion.  He does not recount a colonoscopy for probably more than 10 years though he is not sure he wants to have another one.  He has had recent endoscopic evaluation when he has had his ERCPs in the summer 2022.  GI Review of Systems Positive as above including bloating at times, early satiety during periods of pancreatitis Negative for dysphagia, odynophagia, melena, hematochezia  Review of Systems General: Denies fevers/chills HEENT: Denies oral lesions Cardiovascular: Denies chest pain Pulmonary: Denies shortness of breath Gastroenterological: See HPI Genitourinary: Denies darkened urine or hematuria Hematological: Positive for history of easy bruising/bleeding Dermatological: Denies jaundice Psychological: Mood is stable   Medications Current Outpatient  Medications  Medication Sig Dispense Refill   acetaminophen (TYLENOL) 500 MG tablet Take 2 tablets (1,000 mg total) by mouth every 6 (six) hours as needed for mild pain (or Fever >/= 101).     atorvastatin (LIPITOR) 10 MG tablet Take 1 tablet (10 mg total) by mouth daily.     carvedilol (COREG) 25 MG tablet Take 25 mg by mouth 2 (two) times daily.     clopidogrel (PLAVIX) 75 MG tablet Take 1 tablet (75 mg total) by mouth daily. (Patient taking differently: Take 75 mg by mouth every morning.) 90 tablet 0   Coenzyme Q10 100 MG capsule Take 100 mg by mouth every morning.     Cyanocobalamin 5000 MCG SUBL Place 5,000 mcg under the tongue every morning.     diltiazem (CARDIZEM CD) 240 MG 24 hr capsule Take 240 mg by mouth every morning.     fenofibrate (TRICOR) 145 MG tablet Take 145 mg by mouth every morning.     furosemide (LASIX) 20 MG tablet Take 10 mg by mouth every morning.     lisinopril (ZESTRIL) 30 MG tablet Take 30 mg by mouth 2 (two) times daily.  metFORMIN (GLUCOPHAGE) 500 MG tablet Take 500 mg by mouth 2 (two) times daily.     oxyCODONE (OXY IR/ROXICODONE) 5 MG immediate release tablet Take 1 tablet (5 mg total) by mouth every 4 (four) hours as needed for moderate pain. 20 tablet 0   pantoprazole (PROTONIX) 40 MG tablet Take 40 mg by mouth 2 (two) times daily.     rivaroxaban (XARELTO) 20 MG TABS tablet Take 20 mg by mouth every evening.     sertraline (ZOLOFT) 100 MG tablet Take 100 mg by mouth every evening.     No current facility-administered medications for this visit.    Allergies No Known Allergies  Histories Past Medical History:  Diagnosis Date   Anemia of chronic disease    Chronic atrial fibrillation (HCC)    Chronic mesenteric ischemia (HCC)    Coronary artery disease    Hyperlipidemia associated with type 2 diabetes mellitus (HCC)    Hypertension associated with diabetes (HCC)    OSA (obstructive sleep apnea)    Type 2 diabetes mellitus (HCC)    Past  Surgical History:  Procedure Laterality Date   IR AORTAGRAM ABDOMINAL SERIALOGRAM  09/15/2020   IR RADIOLOGIST EVAL & MGMT  07/25/2020   IR RADIOLOGIST EVAL & MGMT  08/28/2020   IR RADIOLOGIST EVAL & MGMT  10/22/2020   IR RADIOLOGIST EVAL & MGMT  01/08/2021   IR THROMBECT PRIM MECH INIT (INCLU) MOD SED  09/15/2020   IR TRANSCATH PLC STENT 1ST ART NOT LE CV CAR VERT CAR  09/15/2020   IR US GUIDE VASC ACCESS RIGHT  09/15/2020   Social History   Socioeconomic History   Marital status: Married    Spouse name: Not on file   Number of children: Not on file   Years of education: Not on file   Highest education level: Not on file  Occupational History   Not on file  Tobacco Use   Smoking status: Former    Types: Cigarettes   Smokeless tobacco: Never  Vaping Use   Vaping Use: Never used  Substance and Sexual Activity   Alcohol use: Not Currently   Drug use: Never   Sexual activity: Not on file  Other Topics Concern   Not on file  Social History Narrative   Not on file   Social Determinants of Health   Financial Resource Strain: Not on file  Food Insecurity: Not on file  Transportation Needs: Not on file  Physical Activity: Not on file  Stress: Not on file  Social Connections: Not on file  Intimate Partner Violence: Not on file   Family History  Problem Relation Age of Onset   Hypertension Father    Heart failure Father    Colon cancer Neg Hx    Esophageal cancer Neg Hx    Inflammatory bowel disease Neg Hx    Liver disease Neg Hx    Pancreatic cancer Neg Hx    Rectal cancer Neg Hx    Stomach cancer Neg Hx    I have reviewed his medical, social, and family history in detail and updated the electronic medical record as necessary.    PHYSICAL EXAMINATION  BP 108/62    Pulse 75    Ht $R'6\' 1"'GF$  (1.854 m)    Wt 250 lb (113.4 kg)    BMI 32.98 kg/m  Wt Readings from Last 3 Encounters:  03/26/21 250 lb (113.4 kg)  01/21/21 286 lb (129.7 kg)  09/15/20 230 lb (104.3 kg)  GEN: NAD,  appears stated age, doesn't appear chronically ill PSYCH: Cooperative, without pressured speech EYE: Conjunctivae pink, sclerae anicteric ENT: MMM CV: Nontachycardic RESP: No audible wheezing GI: NABS, soft, obese, NT/ND, without rebound or guarding MSK/EXT: Trace lower extremity edema bilaterally SKIN: No jaundice NEURO:  Alert & Oriented x 3, no focal deficits   REVIEW OF DATA  I reviewed the following data at the time of this encounter:  GI Procedures and Studies  May 2022 ERCP [facility] Impression Pancreatic duct leak (head of pancreas): this was treated with pancreatic sphincterotomy and a plastic PD stent placement Smooth tapering of the distal bile duct note, treated with biliary  sphincterotomy with good biliary drainage  June 2022 ERCP Impression 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 this was treated with a plastic PD stent placement  Laboratory Studies  Reviewed those in epic and care everywhere  Imaging Studies  February 2023 CT abdomen pelvis outside 1.  Findings compatible with acute interstitial pancreatitis with inflammatory changes extending to the hepatic flexure of the colon. No necrotizing changes or drainable fluid collections at this time.  2.  Age-indeterminate, though new relative to outside CT dated 07/29/2020, L5 vertebral body fracture with approximately 15-20% height loss. No extension into the posterior elements or substantial osseous retropulsion. Advise correlation with point tenderness. MRI could further evaluate acuity if clinically warranted.  3.  Additional chronic/ancillary findings as detailed above.  January 2023 CT abdomen pelvis outside Findings consistent with acute pancreatitis with stranding and fluid around the head and uncinate process of the pancreas.  There is a ill-defined rim-enhancing fluid collection which extends from the greater curvature of the stomach into the right lower quadrant abutting the  ascending colon suggestive of abscess versus infected pseudocyst. The rim-enhancing collection measures approximately 3.5 x 5.3 x 9.6 cm.  No evidence of bowel obstruction.  Small amount of ascites.  Right pleural effusion with overlying compressive atelectasis.  Cardiomegaly with coronary artery calcifications.  January 2023 CT angio abdomen pelvis: System IMPRESSION: VASCULAR 1. Patent celiac and superior mesenteric artery stents with some narrowing due to atherosclerotic plaque. 2. Aortic Atherosclerosis (ICD10-I70.0) including coronary artery calcifications. 3. No intravenous contrast extravasation to suggest active bleed. NON-VASCULAR 1. Marked duodenitis. Bowel ischemia is not excluded given extent of inflammation and poorly enhancing duodenal wall - this may be due to timing of contrast on the portal venous phase. Correlate with lactate levels. 2. Mild peripancreatic fat stranding along the proximal pancreas which may be due to the adjacent duodenal inflammation with no definite hazy pancreatic contour and no poorly defined/edematous pancreatic parenchyma. 3. Trace right pleural effusion.  September 2022 CT angio abdomen pelvis: System IMPRESSION: VASCULAR 1. The indwelling superior mesenteric ostial stent appears patent in unchanged position. 2. Persistently occluded indwelling celiac ostial stent. 3.  Aortic Atherosclerosis (ICD10-I70.0). NON-VASCULAR 1. Significant interval resolution of previously visualized complicated appendicitis. No discernible periappendiceal fluid collection. 2. Interval enlargement of small right and trace left pleural effusions with associated passive atelectasis.  August 2022 CT chest abdomen pelvis outside IMPRESSION:  CT CHEST  1.  Negative for acute injury identified.  2.  No acute pulmonary infiltrate or pleural effusion identified.   CT ABDOMEN PELVIS  1.  New complex 4 x 2.5 x 2.5 cm heterogeneous fluid collection with surrounding  soft tissue induration and tiny pockets of gas adjacent to the tip of the appendix.  This is concerning for a ruptured appendix with abscess formation.  This was not thought  to represent a mesenteric hematoma.  2.  No definite evidence for acute injury.  CT THORACIC LUMBAR SPINE  No acute fracture, traumatic malalignment, or paraspinal hematoma identified.   August 2022 CT angio abdomen/pelvis IMPRESSION: VASCULAR 1. Recanalized SMA stent is patent. Main SMA branches are patent. No evidence for distal embolic disease following SMA stent recanalization. 2. 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 Reconstitution of the celiac artery branches beyond the occluded stent. 3. Patent enlarged inferior mesenteric artery. 4. At least mild stenosis involving the main right renal artery. NON-VASCULAR 1. Increased inflammatory changes in the right lower quadrant centered on the appendix and distal ileum. Findings compatible with appendicitis and developing periappendiceal abscess. 2. Small amount of ascites. 3. Bilateral renal cysts. 4. New trace right pleural fluid.  July 2022 CT abdomen pelvis: System IMPRESSION: VASCULAR 1. Interval spontaneous thrombosis of previously visualized 0.9 mm peripancreatic pseudoaneurysm. 2. Occluded indwelling celiac and superior mesenteric artery ostial stents. The distal celiac and superior mesenteric artery and their branches are patent secondary to prominent arc of [place] an additional intra-abdominal collateral vessels. 3. At least moderate ostial stenosis of the inferior mesenteric artery secondary to atherosclerotic plaque. 4. Severe focal stenosis of the superior right renal artery (dual right renal artery supply) secondary to atherosclerotic plaque. 5.  Coronary and aortic atherosclerosis (ICD10-I70.0). NON-VASCULAR 1. Significant improvement in previously visualized walled-off necrosis about the pancreatic head, now essentially unmeasurable.  Unchanged punctate calcification in the pancreatic head. The pancreas otherwise appears normal. 2. Evidence of prior granulomatous disease.   ASSESSMENT  Mr. Chichester is a 66 y.o. male with a pmh significant for atrial fibrillation (on anticoagulation), CAD, chronic mesenteric ischemia (status post stenting of SMA and CA - on Plavix), hypertension, hyperlipidemia, diabetes, OSA, status post prior gastric band (now removed), recurrent idiopathic pancreatitis (complicated by previous leak requiring PD stenting and PD sphincterotomy), previous pancreatic pseudoaneurysm appendiceal abscess (status post antibiotic therapy still with appendix), Anemia.   The patient is seen today for evaluation and management of:  1. Idiopathic acute pancreatitis without infection or necrosis   2. Chronic pancreatitis, unspecified pancreatitis type ([place])   3. Abdominal pain, chronic, epigastric   4. History of appendicitis   5. Anemia, unspecified type   6. Heme positive stool    The patient is hemodynamically stable at this time.  Clinically he has idiopathic pancreatitis that in the past required PD stenting due to leakage and has evidence on imaging that suggest chronic pancreatitis.  The etiology of his initial pancreatitis and now is recurrent episodes is not completely defined though I suspect over time he has developed chronic pancreatitis based on imaging that suggest calcifications within the pancreas going back to 2022.  Possibility of persistent PD stricturing leading to recurrent pancreatitis is in the differential though 1 would normally expect to see the PD dilated.  I think this patient will benefit from an MRI/MRCP with secretin to evaluate for pancreatic duct abnormalities during the MRI/MRCP portion.  We can obtain and perform this procedure it will be helpful.  There is a chance that we could stimulate pancreatitis and I did discuss that with the patient.  It is not clear to me that there is an endoscopic  management but the MRI may give [place] some insights.He may choose to still want evaluation at a quaternary center though it is not clear that he wants to go back to [facility].  I am happy to be available for him in an effort of  trying to see if there is something that I can do endoscopically to try and help him though again, I suspect chronic pancreatitis is now the underlying issue for him.  The other thing we need to keep in mind is that he had appendicitis and abscess formation and was treated with antibiotics and still has his appendix.  He will/should have repeat evaluation with surgery to discuss potential appendectomy at some point in the future though it is not an issue currently.  He has upcoming follow-up with his interventional radiologist, Dr. Serafina Royals.  He wants to discuss things further with him before he decides on any other endoscopic evaluation.  I think he may benefit from endoscopic evaluation in the setting of his anemia history and his prior heme positive stool during recent hospitalization.  An upper and lower endoscopy would be recommended.  He does not want to schedule that at this time.  I offered him laboratories today but he wanted to hold on that as he is feeling better enough labs this point.  He may benefit from genetics testing to finalize the work-up of his idiopathic pancreatitis.  I will forward this information to his interventional radiologist and his surgeon.  All patient questions were answered to the best of my ability, and the patient agrees to the aforementioned plan of action with follow-up as indicated.   PLAN  Recommend MRI/MRCP with secretin Consider [facility] as outlined below are pending should patient have severe episode of pain that does not require him to go to the hospital but to dictate potential need for pancreatitis management Try to obtain the February 2023 [facility] images to be uploaded into [employer] health canopy Updated EGD/colonoscopy is  recommended for his heme positive anemia and colonoscopy greater than 10 years-he defers on this currently Any endoscopic procedures will require Plavix and Xarelto hold so we will need to be mindful of that in the future Consideration of Spink and CFTR and PRSS genetic testing in the future to round out the idiopathic pancreatitis work-up Follow-up with surgery to consider interval appendectomy Follow-up with interventional radiology for SMA stenting   Orders Placed This Encounter  Procedures   MR ABDOMEN MRCP W WO CONTAST   CBC   Comp Met (CMET)   Lipase   C-reactive protein    New Prescriptions   No medications on file   Modified Medications   No medications on file    Planned Follow Up No follow-ups on file.   Total Time in Face-to-Face and in Coordination of Care for patient including independent/personal interpretation/review of prior testing, medical history, examination, medication adjustment, communicating results with the patient directly, and documentation within the EHR is greater than 60 minutes.   Justice Britain, MD [facility] Advanced Endoscopy Office # [phone]

## 2021-04-03 ENCOUNTER — Ambulatory Visit (HOSPITAL_COMMUNITY): Payer: BC Managed Care – PPO

## 2021-04-03 ENCOUNTER — Other Ambulatory Visit: Payer: Self-pay

## 2021-04-03 ENCOUNTER — Ambulatory Visit (HOSPITAL_COMMUNITY)
Admission: RE | Admit: 2021-04-03 | Discharge: 2021-04-03 | Disposition: A | Discharge status: Home / Self Care | Payer: BC Managed Care – PPO | Source: Ambulatory Visit | Attending: Gastroenterology | Admitting: Gastroenterology

## 2021-04-03 ENCOUNTER — Other Ambulatory Visit: Payer: Self-pay | Admitting: Gastroenterology

## 2021-04-03 DIAGNOSIS — K8500 Idiopathic acute pancreatitis without necrosis or infection: Secondary | ICD-10-CM

## 2021-04-03 DIAGNOSIS — K861 Other chronic pancreatitis: Secondary | ICD-10-CM

## 2021-04-03 MED ORDER — SECRETIN ACETATE 16 MCG IV SOLR
16.0000 ug | Freq: Once | INTRAVENOUS | Status: AC
  Administered 2021-04-03: 16 ug via INTRAVENOUS
  Filled 2021-04-03: qty 16

## 2021-04-03 MED ORDER — GADOBUTROL 1 MMOL/ML IV SOLN
10.0000 mL | Freq: Once | INTRAVENOUS | Status: AC | PRN
  Administered 2021-04-03: 10 mL via INTRAVENOUS

## 2021-04-08 ENCOUNTER — Other Ambulatory Visit: Payer: Self-pay

## 2021-04-08 ENCOUNTER — Ambulatory Visit
Admission: RE | Admit: 2021-04-08 | Discharge: 2021-04-08 | Disposition: A | Discharge status: Home / Self Care | Payer: BC Managed Care – PPO | Source: Ambulatory Visit | Attending: Interventional Radiology | Admitting: Interventional Radiology

## 2021-04-08 ENCOUNTER — Encounter: Payer: Self-pay | Admitting: *Deleted

## 2021-04-08 DIAGNOSIS — K551 Chronic vascular disorders of intestine: Secondary | ICD-10-CM

## 2021-04-08 NOTE — Progress Notes (Signed)
? ?Reason for follow up: ?The patient is seen in follow up today s/p SMA stent recanalization on 09/15/20 ?  ?History of present illness: ?66 year old male with history of chronic mesenteric ischemia status post celiac and superior mesenteric artery stent placement in March 2022 at outside facility. 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 These complications resolved spontaneously however the patient's abdominal pain persisted in the patient developed acute complicated appendicitis, therefore underwent SMA stent recanalization with laser atherectomy and covered stent relining on 09/15/2020.  ? ?Since his last visit on 01/08/21, he has had at least 2 recurrent episodes of acute on chronic pancreatitis requiring hospitalization from 01/16/21-01/21/21 at [facility], and again in early January for which he presented to [facility] ED with CT abdomen/pelvis which demonstrated a peripancreatic/peri-duodenal cyst.  For this, he was referred to Dr. Rush Landmark for consideration of endoscopic intervention.  Fortunately, repeat CT on 03/10/21 demonstrated complete spontaneous resolution of the cyst.  Dr. Rush Landmark recommended MRCP for further evaluation of the pancreas, which showed some mild diffuse pancreatic duct dilation with irregularity in the head/neck region.  This was the same site of cystic changes during a bout of acute pancreatitis shown on MRI in May 2022.  Additionally, he has had chronic anemia with a reported positive fecal occult blood test.  He remains on Plavix 75 mg QD and Xarelto 20 mg QD.   ? ?He reports that over the past 3-4 days he has had recurrent intermitted sharp abdominal pain again, less severe than prior episodes but present nonetheless.  He has had a moderate appetite, and denies post-prandial abdominal pain.  No nausea or  vomiting. He has noticed no blood in his stool. ? ?Past Medical History:  ?Diagnosis Date  ? Anemia of chronic disease   ? Chronic atrial fibrillation (HCC)   ? Chronic mesenteric ischemia ([place])   ? Coronary artery disease   ? Hyperlipidemia associated with type 2 diabetes mellitus ([place])   ? Hypertension associated with diabetes ([place])   ? OSA (obstructive sleep apnea)   ? Type 2 diabetes mellitus ([place])   ? ? ?Past Surgical History:  ?Procedure Laterality Date  ? IR AORTAGRAM ABDOMINAL SERIALOGRAM  09/15/2020  ? IR RADIOLOGIST EVAL & MGMT  07/25/2020  ? IR RADIOLOGIST EVAL & MGMT  08/28/2020  ? IR RADIOLOGIST EVAL & MGMT  10/22/2020  ? IR RADIOLOGIST EVAL & MGMT  01/08/2021  ? IR THROMBECT PRIM MECH INIT (INCLU) MOD SED  09/15/2020  ? IR TRANSCATH PLC STENT 1ST ART NOT LE CV CAR VERT CAR  09/15/2020  ? IR US GUIDE VASC ACCESS RIGHT  09/15/2020  ? ? ?Allergies: ?Patient has no known allergies. ? ?Medications: ?Prior to Admission medications   ?Medication Sig Start Date End Date Taking? Authorizing Provider  ?acetaminophen (TYLENOL) 500 MG tablet Take 2 tablets (1,000 mg total) by mouth every 6 (six) hours as needed for mild pain (or Fever >/= 101). 09/12/20   Saverio Danker, PA-C  ?atorvastatin (LIPITOR) 10 MG tablet Take 1 tablet (10 mg total) by mouth daily. 01/21/21   Barb Merino, MD  ?carvedilol (COREG) 25 MG tablet Take 25 mg by mouth 2 (two) times daily. 10/31/20   [provider]  ?clopidogrel (PLAVIX) 75 MG tablet Take 1 tablet (75 mg total) by mouth daily. ?Patient taking differently: Take 75 mg by mouth every morning. 09/17/20   Theresa Duty, NP  ?  Coenzyme Q10 100 MG capsule Take 100 mg by mouth every morning.    [provider]  ?Cyanocobalamin 5000 MCG SUBL Place 5,000 mcg under the tongue every morning. 10/28/15   [provider]  ?diltiazem (CARDIZEM CD) 240 MG 24 hr capsule Take 240 mg by mouth every morning. 07/20/20   [provider]  ?fenofibrate (TRICOR) 145 MG tablet  Take 145 mg by mouth every morning. 11/06/20   [provider]  ?furosemide (LASIX) 20 MG tablet Take 10 mg by mouth every morning. 08/18/15   [provider]  ?lisinopril (ZESTRIL) 30 MG tablet Take 30 mg by mouth 2 (two) times daily. 07/16/20   [provider]  ?metFORMIN (GLUCOPHAGE) 500 MG tablet Take 500 mg by mouth 2 (two) times daily. 01/12/21   [provider]  ?oxyCODONE (OXY IR/ROXICODONE) 5 MG immediate release tablet Take 1 tablet (5 mg total) by mouth every 4 (four) hours as needed for moderate pain. 01/21/21   Barb Merino, MD  ?pantoprazole (PROTONIX) 40 MG tablet Take 40 mg by mouth 2 (two) times daily. 04/12/20   [provider]  ?rivaroxaban (XARELTO) 20 MG TABS tablet Take 20 mg by mouth every evening.    [provider]  ?sertraline (ZOLOFT) 100 MG tablet Take 100 mg by mouth every evening. 08/05/20   [provider]  ?  ? ?Family History  ?Problem Relation Age of Onset  ? Hypertension Father   ? Heart failure Father   ? Colon cancer Neg Hx   ? Esophageal cancer Neg Hx   ? Inflammatory bowel disease Neg Hx   ? Liver disease Neg Hx   ? Pancreatic cancer Neg Hx   ? Rectal cancer Neg Hx   ? Stomach cancer Neg Hx   ? ? ?Social History  ? ?Socioeconomic History  ? Marital status: Married  ?  Spouse name: Not on file  ? Number of children: Not on file  ? Years of education: Not on file  ? Highest education level: Not on file  ?Occupational History  ? Not on file  ?Tobacco Use  ? Smoking status: Former  ?  Types: Cigarettes  ? Smokeless tobacco: Never  ?Vaping Use  ? Vaping Use: Never used  ?Substance and Sexual Activity  ? Alcohol use: Not Currently  ? Drug use: Never  ? Sexual activity: Not on file  ?Other Topics Concern  ? Not on file  ?Social History Narrative  ? Not on file  ? ?Social Determinants of Health  ? ?Financial Resource Strain: Not on file  ?Food Insecurity: Not on file  ?Transportation Needs: Not on file  ?Physical Activity:  Not on file  ?Stress: Not on file  ?Social Connections: Not on file  ? ? ? ?Vital Signs: ?There were no vitals taken for this visit. ? ?Physical Exam ?HENT:  ?   Head: Normocephalic.  ?   Mouth/Throat:  ?   Mouth: Mucous membranes are moist.  ?Eyes:  ?   General: No scleral icterus. ?Cardiovascular:  ?   Rate and Rhythm: Normal rate and regular rhythm.  ?Pulmonary:  ?   Breath sounds: Normal breath sounds.  ?Abdominal:  ?   General: There is no distension.  ?Skin: ?   General: Skin is warm and dry.  ?   Coloration: Skin is not jaundiced.  ?Neurological:  ?   Mental Status: He is alert and oriented to person, place, and time.  ? ? ?Imaging: ?Abdominal Duplex (04/08/21): ?Unable  to assess due to overlying bowel gas. ? ?Labs: ? ?CBC: ?Recent Labs  ?  01/18/21 ?0924 01/18/21 ?1409 01/19/21 ?0911 01/19/21 ?2114 01/20/21 ?0416 01/20/21 ?1407 01/21/21 ?4163  ?WBC 8.1  --  7.5  --  8.3  --  9.2  ?HGB 6.9*   < > 6.8* 7.7* 7.1* 7.9* 7.2*  ?HCT 23.2*   < > 23.1* 25.1* 23.4* 25.6* 24.2*  ?PLT 204  --  216  --  207  --  227  ? < > = values in this interval not displayed.  ? ? ?COAGS: ?Recent Labs  ?  09/15/20 ?0630  ?INR 1.0  ? ? ?BMP: ?Recent Labs  ?  01/17/21 ?8453 01/18/21 ?0132 01/19/21 ?0911 01/20/21 ?0416  ?NA 138 140 138 140  ?K 3.4* 4.0 3.4* 3.6  ?CL 108 108 106 108  ?CO2 '22 24 25 25  '$ ?GLUCOSE 125* 112* 159* 117*  ?BUN 16 22 27* 25*  ?CALCIUM 8.5* 8.5* 8.1* 8.2*  ?CREATININE 0.79 0.88 0.97 0.99  ?GFRNONAA >60 >60 >60 >60  ? ? ?LIVER FUNCTION TESTS: ?Recent Labs  ?  09/15/20 ?0630 01/16/21 ?0805 01/17/21 ?6468 01/18/21 ?0132  ?BILITOT 0.5 0.8 1.0 1.0  ?AST 17 15 14* 11*  ?ALT '13 11 9 7  '$ ?ALKPHOS 205* 68 57 50  ?PROT 7.0 7.4 7.0 6.0*  ?ALBUMIN 3.0* 3.6 3.4* 2.8*  ? ? ?Assessment: ?66 year old male with history of chronic mesenteric ischemia status post celiac and superior mesenteric artery stent placement in March 2022 at outside facility. 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 These complications resolved spontaneously however the patient's abdominal

## 2021-04-09 ENCOUNTER — Other Ambulatory Visit: Payer: Self-pay

## 2021-04-13 ENCOUNTER — Telehealth: Payer: Self-pay

## 2021-04-13 NOTE — Telephone Encounter (Signed)
Received faxed response from julie Martinique NP that the pt can hold plavix/xarelto 5 days prior to the 05/28/21 procedure.  This will be scanned into the system.   ? ?Left message on machine to call back  ?

## 2021-04-13 NOTE — Telephone Encounter (Signed)
The pt returned call and has been advised of the plavix and xarelto hold for 5 days prior to his upcoming procedure. The pt has been advised of the information and verbalized understanding.    ?

## 2021-04-22 ENCOUNTER — Inpatient Hospital Stay (HOSPITAL_COMMUNITY)
Admission: AD | Admit: 2021-04-22 | Discharge: 2021-04-28 | DRG: 394 | Disposition: A | Discharge status: Home / Self Care | Payer: BC Managed Care – PPO | Source: Other Acute Inpatient Hospital | Attending: Internal Medicine | Admitting: Internal Medicine

## 2021-04-22 DIAGNOSIS — K861 Other chronic pancreatitis: Secondary | ICD-10-CM | POA: Diagnosis present

## 2021-04-22 DIAGNOSIS — Z8249 Family history of ischemic heart disease and other diseases of the circulatory system: Secondary | ICD-10-CM

## 2021-04-22 DIAGNOSIS — Z87891 Personal history of nicotine dependence: Secondary | ICD-10-CM

## 2021-04-22 DIAGNOSIS — Z7902 Long term (current) use of antithrombotics/antiplatelets: Secondary | ICD-10-CM | POA: Diagnosis not present

## 2021-04-22 DIAGNOSIS — K921 Melena: Secondary | ICD-10-CM | POA: Diagnosis not present

## 2021-04-22 DIAGNOSIS — R1084 Generalized abdominal pain: Secondary | ICD-10-CM | POA: Diagnosis not present

## 2021-04-22 DIAGNOSIS — F419 Anxiety disorder, unspecified: Secondary | ICD-10-CM | POA: Diagnosis present

## 2021-04-22 DIAGNOSIS — R9389 Abnormal findings on diagnostic imaging of other specified body structures: Secondary | ICD-10-CM | POA: Diagnosis not present

## 2021-04-22 DIAGNOSIS — K5520 Angiodysplasia of colon without hemorrhage: Secondary | ICD-10-CM

## 2021-04-22 DIAGNOSIS — Z9989 Dependence on other enabling machines and devices: Secondary | ICD-10-CM | POA: Diagnosis not present

## 2021-04-22 DIAGNOSIS — D122 Benign neoplasm of ascending colon: Secondary | ICD-10-CM | POA: Diagnosis not present

## 2021-04-22 DIAGNOSIS — I2510 Atherosclerotic heart disease of native coronary artery without angina pectoris: Secondary | ICD-10-CM | POA: Diagnosis present

## 2021-04-22 DIAGNOSIS — R6889 Other general symptoms and signs: Secondary | ICD-10-CM | POA: Diagnosis not present

## 2021-04-22 DIAGNOSIS — F32A Depression, unspecified: Secondary | ICD-10-CM | POA: Diagnosis present

## 2021-04-22 DIAGNOSIS — K859 Acute pancreatitis without necrosis or infection, unspecified: Secondary | ICD-10-CM | POA: Diagnosis not present

## 2021-04-22 DIAGNOSIS — I10 Essential (primary) hypertension: Secondary | ICD-10-CM

## 2021-04-22 DIAGNOSIS — E1151 Type 2 diabetes mellitus with diabetic peripheral angiopathy without gangrene: Secondary | ICD-10-CM | POA: Diagnosis present

## 2021-04-22 DIAGNOSIS — R162 Hepatomegaly with splenomegaly, not elsewhere classified: Secondary | ICD-10-CM | POA: Diagnosis present

## 2021-04-22 DIAGNOSIS — K648 Other hemorrhoids: Secondary | ICD-10-CM | POA: Diagnosis present

## 2021-04-22 DIAGNOSIS — Z7901 Long term (current) use of anticoagulants: Secondary | ICD-10-CM | POA: Diagnosis not present

## 2021-04-22 DIAGNOSIS — I152 Hypertension secondary to endocrine disorders: Secondary | ICD-10-CM | POA: Diagnosis present

## 2021-04-22 DIAGNOSIS — G4733 Obstructive sleep apnea (adult) (pediatric): Secondary | ICD-10-CM | POA: Diagnosis present

## 2021-04-22 DIAGNOSIS — E785 Hyperlipidemia, unspecified: Secondary | ICD-10-CM | POA: Diagnosis present

## 2021-04-22 DIAGNOSIS — Z9884 Bariatric surgery status: Secondary | ICD-10-CM | POA: Diagnosis not present

## 2021-04-22 DIAGNOSIS — K922 Gastrointestinal hemorrhage, unspecified: Secondary | ICD-10-CM | POA: Diagnosis not present

## 2021-04-22 DIAGNOSIS — D62 Acute posthemorrhagic anemia: Secondary | ICD-10-CM | POA: Diagnosis not present

## 2021-04-22 DIAGNOSIS — R1011 Right upper quadrant pain: Secondary | ICD-10-CM | POA: Diagnosis not present

## 2021-04-22 DIAGNOSIS — Z7984 Long term (current) use of oral hypoglycemic drugs: Secondary | ICD-10-CM

## 2021-04-22 DIAGNOSIS — R109 Unspecified abdominal pain: Secondary | ICD-10-CM | POA: Diagnosis present

## 2021-04-22 DIAGNOSIS — K7460 Unspecified cirrhosis of liver: Secondary | ICD-10-CM | POA: Diagnosis not present

## 2021-04-22 DIAGNOSIS — K625 Hemorrhage of anus and rectum: Secondary | ICD-10-CM

## 2021-04-22 DIAGNOSIS — I480 Paroxysmal atrial fibrillation: Secondary | ICD-10-CM | POA: Diagnosis present

## 2021-04-22 DIAGNOSIS — Z6832 Body mass index (BMI) 32.0-32.9, adult: Secondary | ICD-10-CM

## 2021-04-22 DIAGNOSIS — I482 Chronic atrial fibrillation, unspecified: Secondary | ICD-10-CM | POA: Diagnosis present

## 2021-04-22 DIAGNOSIS — E669 Obesity, unspecified: Secondary | ICD-10-CM | POA: Diagnosis present

## 2021-04-22 DIAGNOSIS — K551 Chronic vascular disorders of intestine: Secondary | ICD-10-CM | POA: Diagnosis present

## 2021-04-22 DIAGNOSIS — J90 Pleural effusion, not elsewhere classified: Secondary | ICD-10-CM | POA: Diagnosis present

## 2021-04-22 DIAGNOSIS — K8500 Idiopathic acute pancreatitis without necrosis or infection: Secondary | ICD-10-CM | POA: Diagnosis not present

## 2021-04-22 DIAGNOSIS — Z79899 Other long term (current) drug therapy: Secondary | ICD-10-CM

## 2021-04-22 DIAGNOSIS — K635 Polyp of colon: Secondary | ICD-10-CM | POA: Diagnosis present

## 2021-04-22 DIAGNOSIS — E119 Type 2 diabetes mellitus without complications: Secondary | ICD-10-CM

## 2021-04-22 DIAGNOSIS — Q399 Congenital malformation of esophagus, unspecified: Secondary | ICD-10-CM

## 2021-04-22 DIAGNOSIS — R188 Other ascites: Secondary | ICD-10-CM | POA: Diagnosis present

## 2021-04-22 DIAGNOSIS — K651 Peritoneal abscess: Principal | ICD-10-CM

## 2021-04-22 DIAGNOSIS — K7689 Other specified diseases of liver: Secondary | ICD-10-CM | POA: Diagnosis not present

## 2021-04-22 DIAGNOSIS — E876 Hypokalemia: Secondary | ICD-10-CM | POA: Diagnosis not present

## 2021-04-22 LAB — GLUCOSE, CAPILLARY
Glucose-Capillary: 70 mg/dL (ref 70–99)
Glucose-Capillary: 86 mg/dL (ref 70–99)

## 2021-04-22 MED ORDER — DEXTROSE 50 % IV SOLN
12.5000 g | INTRAVENOUS | Status: AC
  Administered 2021-04-22: 12.5 g via INTRAVENOUS

## 2021-04-22 MED ORDER — MORPHINE SULFATE (PF) 2 MG/ML IV SOLN
1.0000 mg | INTRAVENOUS | Status: DC | PRN
  Administered 2021-04-22 – 2021-04-23 (×3): 1 mg via INTRAVENOUS
  Filled 2021-04-22 (×3): qty 1

## 2021-04-22 MED ORDER — DEXTROSE 50 % IV SOLN
INTRAVENOUS | Status: AC
Start: 2021-04-22 — End: 2021-04-22
  Filled 2021-04-22: qty 50

## 2021-04-22 MED ORDER — INSULIN ASPART 100 UNIT/ML IJ SOLN: 0.0000 [IU] | INTRAMUSCULAR | Status: DC

## 2021-04-22 NOTE — Assessment & Plan Note (Addendum)
Pneumonia ?-Imaging done at outside hospital showing moderate loculated right pleural effusion with right lower lobe consolidation. ?-Patient was treated with Zosyn. ?-Not febrile.  Labs done earlier today at outside hospital showing no leukocytosis. ?-He is not hypoxic and not endorsing any respiratory symptoms. ?-Repeat chest x-ray ordered ?

## 2021-04-22 NOTE — Assessment & Plan Note (Addendum)
Acute on chronic abdominal pain ?Loculated right perihepatic/subserosal fluid collection  ?-  Patient with past GI history of chronic mesenteric ischemia status post celiac and SMA stenting and subsequent thrombectomy for occluded stents 08/2020, status post prior gastric band (now removed), recurrent idiopathic pancreatitis (complicated by previous leak requiring PD stenting and PD sphincterotomy), previous pancreatic pseudoaneurysm, appendiceal abscess. ?- Admitted to [facility] on 04/14/2021 for complaints of abdominal pain, nausea, and vomiting.   ?- Initial Labs revealed lipase 433.   ?- CT abdomen pelvis with contrast done 04/14/2021 showing mild acute on chronic pancreatitis.  Cirrhosis with portal hypertension, varices, splenomegaly, and mild ascites.  Mildly distended gallbladder without radiopaque calculi. ?- Right upper quadrant ultrasound done 04/15/2021 showing no evidence of cholelithiasis.  No gallbladder wall thickening or pericholecystic fluid.  Negative sonographic Murphy's sign.  CBD measuring 0.2 cm.  Ascites.  Nonvisualization of the inferior vena cava and pancreas due to overlying bowel gas. ?- Repeat CT abdomen pelvis with contrast done 04/19/2021 showing persistent/recurrent findings of acute on chronic pancreatitis.  Probable secondary reactive changes involving the duodenal sweep.  Interval enlargement of the loculated right perihepatic/subserosal fluid collection, difficult to measure given configuration though spans approximately 24 cm craniocaudal and 11 cm transverse.  Wall thickening and inflammation involving the ascending colon which may reflect mild colitis.   ?- Treated with Zosyn at outside hospital. ?- Patient had an episode of large-volume hematemesis on 04/21/2021. EGD done the same day showing gastritis and focal duodenitis without evidence of active bleeding or discrete bleeding lesion.  No esophageal or gastric varices. ?- Status post IR placement of  a CT-guided drain in the loculated right perihepatic/subserosal fluid collection on 04/22/2021.  Bright red blood output of 1470 cc at outside hospital.  Patient was given 2 units PRBCs. ?- Hospitalist at outside hospital discussed the case with GI team who felt that patient may have pseudoaneurysm, lacerated liver bleed although remote history of trauma weeks ago.  Recommended holding any DVT prophylaxis or anticoagulation.  Aspirin was also held.  He did not have any further episodes of hematemesis. ?- Labs done 04/22/2021 at outside hospital: Lipase trended down to 28. WBC 7.3. Hemoglobin 8.5. ?- Patient transferred to [facility] for evaluation by IR for possible embolization. ?   ?Plan: ?- Patient is hemodynamically stable.  Reports significant improvement of his abdominal pain since after placement of drain, continue to monitor output. ?-Repeat labs including CBC, CMP, lipase, PT/INR, type and screen ordered. ?-Transfuse if hemoglobin less than 7 ?-Continue to monitor H&H closely ?-Pain management ?-Keep n.p.o. ?-Continue to hold Xarelto and aspirin ?-Please consult IR in the morning. ?-I have sent a secure chat message to on-call physician for [facility] requesting consultation in the morning. ?

## 2021-04-22 NOTE — Assessment & Plan Note (Signed)
-  A1c 5.8 on 04/15/2021 on labs done at outside facility. ?-Sliding scale insulin sensitive every 4 hours. ?

## 2021-04-22 NOTE — H&P (Signed)
?History and Physical  ? ? ?Benjamin Fox HFW:[id number] DOB: Nov 30, 1955 DOA: 04/22/2021 ? ?PCP: Martinique, Julie M, NP ? ?Patient coming from: Outside Hospital ([place]) ? ?Chief Complaint: Abdominal pain ? ?HPI: Benjamin Fox is a 66 y.o. male with medical history significant of chronic A-fib on Xarelto, chronic mesenteric ischemia status post celiac and SMA stenting and subsequent thrombectomy for occluded stents 08/2020, CAD status post RCA stenting, type 2 diabetes, hypertension, hyperlipidemia, depression, anxiety, OSA on CPAP, status post prior gastric band (now removed), recurrent idiopathic pancreatitis (complicated by previous leak requiring PD stenting and PD sphincterotomy), previous pancreatic pseudoaneurysm, appendiceal abscess. ? ?History per review of paper chart from outside facility in signout from accepting physician Dr. Joya Gaskins: ?>Patient was admitted to [facility] on 04/14/2021 for complaints of abdominal pain, nausea, and vomiting.   ?>Labs revealed lipase 433.   ?>CT abdomen pelvis with contrast done 04/14/2021 showing mild acute on chronic pancreatitis.  Cirrhosis with portal hypertension, varices, splenomegaly, and mild ascites.  Mildly distended gallbladder without radiopaque calculi. ?> Right upper quadrant ultrasound done 04/15/2021 showing no evidence of cholelithiasis.  No gallbladder wall thickening or pericholecystic fluid.  Negative sonographic Murphy's sign.  CBD measuring 0.2 cm.  Ascites.  Nonvisualization of the inferior vena cava and pancreas due to overlying bowel gas. ?> Repeat CT abdomen pelvis with contrast done 04/19/2021 showing persistent/recurrent findings of acute on chronic pancreatitis.  Probable secondary reactive changes involving the duodenal sweep.  Interval enlargement of the loculated right perihepatic/subserosal fluid collection, difficult to measure given configuration though spans approximately 24 cm  craniocaudal and 11 cm transverse.  Wall thickening and inflammation involving the ascending colon which may reflect mild colitis.  Moderate loculated right pleural effusion with right lower lobe consolidation.  Wedge-shaped region of diminished attenuation involving the left kidney, suspicious for pyelonephritis.  Infarction felt less likely. ?> Chest x-ray done 04/20/2021 showing moderate-sized right pleural effusion and underlying atelectasis and/or infiltrate. ?> Patient had large volume hematemesis on 04/21/2021. ?> EGD done 04/21/2021 showing gastritis and focal duodenitis without evidence of active bleeding or discrete bleeding lesion.  No esophageal or gastric varices.  Indeterminate left pyriform sinus lesion, may represent eroded mucosa in the setting of dehydration though oropharyngeal malignancy not excluded. ?> Status post IR placement of a CT-guided drain in the loculated right perihepatic/subserosal fluid collection seen on CT. This procedure was done on 04/22/2021.  Bright red blood output of 1470 cc.  Patient was given 2 units PRBCs. ?> Hospitalist at outside hospital discussed the case with GI team who felt that patient may have pseudoaneurysm, lacerated liver bleed although remote history of trauma weeks ago.  Recommended holding any DVT prophylaxis or anticoagulation.  Aspirin was also held.  He did not have any further episodes of hematemesis. ?> Lipase trended down to 28 on 04/22/2021. ?> Labs done 04/22/2021 showing WBC 7.3, hemoglobin 8.5. ?> Patient was treated with Zosyn during this hospitalization. ?> A1c 5.8 on 04/15/2021. ?> Patient was transferred to [facility] for evaluation by IR for possible embolization. Physician at [facility] had discussed the patient's case with on-call IR, Dr. Annamaria Boots who agreed to see the patient in consultation.  Patient follows Dr. Serafina Royals with IR. ? ?History per patient: Patient states he has had pancreatitis in the past and started having  epigastric/right upper quadrant abdominal pain 2 weeks ago which did not improve after changing his diet.  States he was told at the outside hospital that  he had fluid in his liver and underwent drain placement after which a lot of blood came out.  Also reports an episode of hematemesis 2 days ago, no further episodes since then.  Reports having a large nonbloody bowel movement 2 days ago.  States his abdominal pain has significantly improved after the drain was placed however he is still having some discomfort at the site of the drain.  States he was also told that he had some fluid in his lung but he is not having any fevers or shortness of breath.  States he was treated with an antibiotic.  Denies chest pain.  Patient states his family has already contacted Dr. Serafina Royals and Dr. Rush Landmark and they are aware that he is transferred to [facility] and will see him in the morning. ? ?Review of Systems:  ?Review of Systems  ?All other systems reviewed and are negative. ? ?Past Medical History:  ?Diagnosis Date  ? Anemia of chronic disease   ? Chronic atrial fibrillation (HCC)   ? Chronic mesenteric ischemia ([place])   ? Coronary artery disease   ? Hyperlipidemia associated with type 2 diabetes mellitus ([place])   ? Hypertension associated with diabetes ([place])   ? OSA (obstructive sleep apnea)   ? Type 2 diabetes mellitus ([place])   ? ? ?Past Surgical History:  ?Procedure Laterality Date  ? IR AORTAGRAM ABDOMINAL SERIALOGRAM  09/15/2020  ? IR RADIOLOGIST EVAL & MGMT  07/25/2020  ? IR RADIOLOGIST EVAL & MGMT  08/28/2020  ? IR RADIOLOGIST EVAL & MGMT  10/22/2020  ? IR RADIOLOGIST EVAL & MGMT  01/08/2021  ? IR RADIOLOGIST EVAL & MGMT  04/08/2021  ? IR THROMBECT PRIM MECH INIT (INCLU) MOD SED  09/15/2020  ? IR TRANSCATH PLC STENT 1ST ART NOT LE CV CAR VERT CAR  09/15/2020  ? IR US GUIDE VASC ACCESS RIGHT  09/15/2020  ? ? ? reports that he has quit smoking. His smoking use included cigarettes. He has never used smokeless tobacco. He  reports that he does not currently use alcohol. He reports that he does not use drugs. ? ?No Known Allergies ? ?Family History  ?Problem Relation Age of Onset  ? Hypertension Father   ? Heart failure Father   ? Colon cancer Neg Hx   ? Esophageal cancer Neg Hx   ? Inflammatory bowel disease Neg Hx   ? Liver disease Neg Hx   ? Pancreatic cancer Neg Hx   ? Rectal cancer Neg Hx   ? Stomach cancer Neg Hx   ? ? ?Prior to Admission medications   ?Medication Sig Start Date End Date Taking? Authorizing Provider  ?acetaminophen (TYLENOL) 500 MG tablet Take 2 tablets (1,000 mg total) by mouth every 6 (six) hours as needed for mild pain (or Fever >/= 101). 09/12/20   Saverio Danker, PA-C  ?atorvastatin (LIPITOR) 10 MG tablet Take 1 tablet (10 mg total) by mouth daily. 01/21/21   Barb Merino, MD  ?carvedilol (COREG) 25 MG tablet Take 25 mg by mouth 2 (two) times daily. 10/31/20   [provider]  ?clopidogrel (PLAVIX) 75 MG tablet Take 1 tablet (75 mg total) by mouth daily. ?Patient taking differently: Take 75 mg by mouth every morning. 09/17/20   Theresa Duty, NP  ?Coenzyme Q10 100 MG capsule Take 100 mg by mouth every morning.    [provider]  ?Cyanocobalamin 5000 MCG SUBL Place 5,000 mcg under the tongue every morning. 10/28/15   [provider]  ?  diltiazem (CARDIZEM CD) 240 MG 24 hr capsule Take 240 mg by mouth every morning. 07/20/20   [provider]  ?fenofibrate (TRICOR) 145 MG tablet Take 145 mg by mouth every morning. 11/06/20   [provider]  ?furosemide (LASIX) 20 MG tablet Take 10 mg by mouth every morning. 08/18/15   [provider]  ?lisinopril (ZESTRIL) 30 MG tablet Take 30 mg by mouth 2 (two) times daily. 07/16/20   [provider]  ?metFORMIN (GLUCOPHAGE) 500 MG tablet Take 500 mg by mouth 2 (two) times daily. 01/12/21   [provider]  ?oxyCODONE (OXY IR/ROXICODONE) 5 MG immediate release tablet Take 1 tablet (5 mg total) by mouth  every 4 (four) hours as needed for moderate pain. 01/21/21   Barb Merino, MD  ?pantoprazole (PROTONIX) 40 MG tablet Take 40 mg by mouth 2 (two) times daily. 04/12/20   [provider]  ?rivaroxaban Alveda Reasons

## 2021-04-22 NOTE — Progress Notes (Signed)
Patient has home CPAP.  RT assistance not needed at this time. ?

## 2021-04-23 ENCOUNTER — Encounter (HOSPITAL_COMMUNITY)
Admission: AD | Disposition: A | Discharge status: Home / Self Care | Payer: Self-pay | Source: Other Acute Inpatient Hospital | Attending: Internal Medicine

## 2021-04-23 ENCOUNTER — Inpatient Hospital Stay (HOSPITAL_COMMUNITY): Payer: BC Managed Care – PPO | Admitting: Certified Registered Nurse Anesthetist

## 2021-04-23 ENCOUNTER — Inpatient Hospital Stay (HOSPITAL_COMMUNITY): Payer: BC Managed Care – PPO

## 2021-04-23 ENCOUNTER — Encounter (HOSPITAL_COMMUNITY): Payer: Self-pay | Admitting: Internal Medicine

## 2021-04-23 DIAGNOSIS — K859 Acute pancreatitis without necrosis or infection, unspecified: Secondary | ICD-10-CM

## 2021-04-23 DIAGNOSIS — R6889 Other general symptoms and signs: Secondary | ICD-10-CM

## 2021-04-23 DIAGNOSIS — K7689 Other specified diseases of liver: Secondary | ICD-10-CM

## 2021-04-23 DIAGNOSIS — I2510 Atherosclerotic heart disease of native coronary artery without angina pectoris: Secondary | ICD-10-CM | POA: Diagnosis not present

## 2021-04-23 DIAGNOSIS — J90 Pleural effusion, not elsewhere classified: Secondary | ICD-10-CM | POA: Diagnosis not present

## 2021-04-23 DIAGNOSIS — K8500 Idiopathic acute pancreatitis without necrosis or infection: Secondary | ICD-10-CM

## 2021-04-23 DIAGNOSIS — D62 Acute posthemorrhagic anemia: Secondary | ICD-10-CM

## 2021-04-23 DIAGNOSIS — K7460 Unspecified cirrhosis of liver: Secondary | ICD-10-CM

## 2021-04-23 DIAGNOSIS — R9389 Abnormal findings on diagnostic imaging of other specified body structures: Secondary | ICD-10-CM

## 2021-04-23 DIAGNOSIS — K625 Hemorrhage of anus and rectum: Secondary | ICD-10-CM

## 2021-04-23 HISTORY — PX: BIOPSY: SHX5522

## 2021-04-23 HISTORY — PX: ESOPHAGOGASTRODUODENOSCOPY (EGD) WITH PROPOFOL: SHX5813

## 2021-04-23 HISTORY — PX: FLEXIBLE SIGMOIDOSCOPY: SHX5431

## 2021-04-23 LAB — GLUCOSE, CAPILLARY
Glucose-Capillary: 103 mg/dL — ABNORMAL HIGH (ref 70–99)
Glucose-Capillary: 120 mg/dL — ABNORMAL HIGH (ref 70–99)
Glucose-Capillary: 63 mg/dL — ABNORMAL LOW (ref 70–99)
Glucose-Capillary: 78 mg/dL (ref 70–99)
Glucose-Capillary: 85 mg/dL (ref 70–99)
Glucose-Capillary: 89 mg/dL (ref 70–99)
Glucose-Capillary: 92 mg/dL (ref 70–99)
Glucose-Capillary: 92 mg/dL (ref 70–99)
Glucose-Capillary: 94 mg/dL (ref 70–99)
Glucose-Capillary: 98 mg/dL (ref 70–99)

## 2021-04-23 LAB — PROTIME-INR
INR: 1.2 (ref 0.8–1.2)
Prothrombin Time: 15.3 seconds — ABNORMAL HIGH (ref 11.4–15.2)

## 2021-04-23 LAB — CBC
HCT: 25.9 % — ABNORMAL LOW (ref 39.0–52.0)
HCT: 27.9 % — ABNORMAL LOW (ref 39.0–52.0)
HCT: 28.7 % — ABNORMAL LOW (ref 39.0–52.0)
Hemoglobin: 8.6 g/dL — ABNORMAL LOW (ref 13.0–17.0)
Hemoglobin: 9.2 g/dL — ABNORMAL LOW (ref 13.0–17.0)
Hemoglobin: 9.3 g/dL — ABNORMAL LOW (ref 13.0–17.0)
MCH: 29.9 pg (ref 26.0–34.0)
MCH: 29.8 pg (ref 26.0–34.0)
MCH: 29.3 pg (ref 26.0–34.0)
MCHC: 33.2 g/dL (ref 30.0–36.0)
MCHC: 33 g/dL (ref 30.0–36.0)
MCHC: 32.4 g/dL (ref 30.0–36.0)
MCV: 89.9 fL (ref 80.0–100.0)
MCV: 90.3 fL (ref 80.0–100.0)
MCV: 90.5 fL (ref 80.0–100.0)
Platelets: 260 10*3/uL (ref 150–400)
Platelets: 279 10*3/uL (ref 150–400)
Platelets: 249 10*3/uL (ref 150–400)
RBC: 2.88 MIL/uL — ABNORMAL LOW (ref 4.22–5.81)
RBC: 3.09 MIL/uL — ABNORMAL LOW (ref 4.22–5.81)
RBC: 3.17 MIL/uL — ABNORMAL LOW (ref 4.22–5.81)
RDW: 16.8 % — ABNORMAL HIGH (ref 11.5–15.5)
RDW: 17 % — ABNORMAL HIGH (ref 11.5–15.5)
RDW: 16.8 % — ABNORMAL HIGH (ref 11.5–15.5)
WBC: 6.7 10*3/uL (ref 4.0–10.5)
WBC: 6.6 10*3/uL (ref 4.0–10.5)
WBC: 7.3 10*3/uL (ref 4.0–10.5)
nRBC: 0.3 % — ABNORMAL HIGH (ref 0.0–0.2)
nRBC: 0.3 % — ABNORMAL HIGH (ref 0.0–0.2)
nRBC: 0.4 % — ABNORMAL HIGH (ref 0.0–0.2)

## 2021-04-23 LAB — COMPREHENSIVE METABOLIC PANEL
ALT: 13 U/L (ref 0–44)
AST: 19 U/L (ref 15–41)
Albumin: 1.9 g/dL — ABNORMAL LOW (ref 3.5–5.0)
Alkaline Phosphatase: 140 U/L — ABNORMAL HIGH (ref 38–126)
Anion gap: 11 (ref 5–15)
BUN: 6 mg/dL — ABNORMAL LOW (ref 8–23)
CO2: 28 mmol/L (ref 22–32)
Calcium: 7.9 mg/dL — ABNORMAL LOW (ref 8.9–10.3)
Chloride: 102 mmol/L (ref 98–111)
Creatinine, Ser: 0.64 mg/dL (ref 0.61–1.24)
GFR, Estimated: >60 mL/min (ref >60)
Glucose, Bld: 76 mg/dL (ref 70–99)
Potassium: 3.2 mmol/L — ABNORMAL LOW (ref 3.5–5.1)
Sodium: 141 mmol/L (ref 135–145)
Total Bilirubin: 1.1 mg/dL (ref 0.3–1.2)
Total Protein: 5.1 g/dL — ABNORMAL LOW (ref 6.5–8.1)

## 2021-04-23 LAB — LIPASE, BLOOD: Lipase: 25 U/L (ref 11–51)

## 2021-04-23 LAB — URINALYSIS, COMPLETE (UACMP) WITH MICROSCOPIC
Bacteria, UA: NONE SEEN
Glucose, UA: NEGATIVE mg/dL
Hgb urine dipstick: NEGATIVE
Ketones, ur: 40 mg/dL — AB
Leukocytes,Ua: NEGATIVE
Nitrite: NEGATIVE
Protein, ur: NEGATIVE mg/dL
Specific Gravity, Urine: 1.01 (ref 1.005–1.030)
pH: 6.5 (ref 5.0–8.0)

## 2021-04-23 LAB — TYPE AND SCREEN
ABO/RH(D): O POS
Antibody Screen: NEGATIVE

## 2021-04-23 SURGERY — SIGMOIDOSCOPY, FLEXIBLE
Anesthesia: Monitor Anesthesia Care

## 2021-04-23 SURGERY — ESOPHAGOGASTRODUODENOSCOPY (EGD) WITH PROPOFOL
Anesthesia: Monitor Anesthesia Care

## 2021-04-23 MED ORDER — HYDRALAZINE HCL 20 MG/ML IJ SOLN
10.0000 mg | Freq: Four times a day (QID) | INTRAMUSCULAR | Status: DC | PRN
  Administered 2021-04-25: 10 mg via INTRAVENOUS
  Filled 2021-04-23: qty 1

## 2021-04-23 MED ORDER — SODIUM CHLORIDE 0.9% FLUSH
5.0000 mL | Freq: Three times a day (TID) | INTRAVENOUS | Status: DC
Start: 2021-04-23 — End: 2021-04-28
  Administered 2021-04-23 – 2021-04-28 (×13): 5 mL

## 2021-04-23 MED ORDER — PEG-KCL-NACL-NASULF-NA ASC-C 100 G PO SOLR: 1.0000 | Freq: Once | ORAL | Status: DC

## 2021-04-23 MED ORDER — HYDRALAZINE HCL 20 MG/ML IJ SOLN: 5.0000 mg | INTRAMUSCULAR | Status: DC | PRN

## 2021-04-23 MED ORDER — POTASSIUM CHLORIDE 10 MEQ/100ML IV SOLN
10.0000 meq | INTRAVENOUS | Status: AC
  Administered 2021-04-23 (×4): 10 meq via INTRAVENOUS
  Filled 2021-04-23 (×4): qty 100

## 2021-04-23 MED ORDER — HYDROMORPHONE HCL 1 MG/ML IJ SOLN
1.0000 mg | INTRAMUSCULAR | Status: DC | PRN
  Administered 2021-04-23 – 2021-04-28 (×11): 1 mg via INTRAVENOUS
  Filled 2021-04-23 (×11): qty 1

## 2021-04-23 MED ORDER — SERTRALINE HCL 100 MG PO TABS
100.0000 mg | ORAL_TABLET | Freq: Every evening | ORAL | Status: DC
Start: 2021-04-23 — End: 2021-04-28
  Administered 2021-04-23 – 2021-04-27 (×5): 100 mg via ORAL
  Filled 2021-04-23 (×5): qty 1

## 2021-04-23 MED ORDER — PEG-KCL-NACL-NASULF-NA ASC-C 100 G PO SOLR: 0.5000 | Freq: Once | ORAL | Status: DC

## 2021-04-23 MED ORDER — PROPOFOL 500 MG/50ML IV EMUL
INTRAVENOUS | Status: DC | PRN
  Administered 2021-04-23: 100 ug/kg/min via INTRAVENOUS

## 2021-04-23 MED ORDER — PEG-KCL-NACL-NASULF-NA ASC-C 100 G PO SOLR
0.5000 | Freq: Once | ORAL | Status: DC
  Filled 2021-04-23: qty 1

## 2021-04-23 MED ORDER — HYDROCODONE-ACETAMINOPHEN 7.5-325 MG PO TABS: 1.0000 | ORAL_TABLET | Freq: Four times a day (QID) | ORAL | Status: DC | PRN

## 2021-04-23 MED ORDER — SODIUM CHLORIDE 0.9 % IV SOLN: INTRAVENOUS | Status: DC | PRN

## 2021-04-23 MED ORDER — PHENYLEPHRINE 40 MCG/ML (10ML) SYRINGE FOR IV PUSH (FOR BLOOD PRESSURE SUPPORT)
PREFILLED_SYRINGE | INTRAVENOUS | Status: DC | PRN
  Administered 2021-04-23: 80 ug via INTRAVENOUS

## 2021-04-23 MED ORDER — DEXTROSE-NACL 5-0.9 % IV SOLN: INTRAVENOUS | Status: AC

## 2021-04-23 MED ORDER — PROPOFOL 10 MG/ML IV BOLUS
INTRAVENOUS | Status: DC | PRN
Start: 2021-04-23 — End: 2021-04-23
  Administered 2021-04-23: 30 mg via INTRAVENOUS
  Administered 2021-04-23 (×2): 20 mg via INTRAVENOUS

## 2021-04-23 MED ORDER — DILTIAZEM HCL ER COATED BEADS 240 MG PO CP24
240.0000 mg | ORAL_CAPSULE | Freq: Every morning | ORAL | Status: DC
Start: 2021-04-23 — End: 2021-04-28
  Administered 2021-04-23 – 2021-04-28 (×6): 240 mg via ORAL
  Filled 2021-04-23 (×6): qty 1

## 2021-04-23 MED ORDER — PANTOPRAZOLE SODIUM 40 MG IV SOLR
40.0000 mg | Freq: Two times a day (BID) | INTRAVENOUS | Status: DC
Start: 2021-04-23 — End: 2021-04-26
  Administered 2021-04-23 – 2021-04-26 (×7): 40 mg via INTRAVENOUS
  Filled 2021-04-23 (×7): qty 10

## 2021-04-23 MED ORDER — ATORVASTATIN CALCIUM 10 MG PO TABS
10.0000 mg | ORAL_TABLET | Freq: Every day | ORAL | Status: DC
  Administered 2021-04-23 – 2021-04-28 (×5): 10 mg via ORAL
  Filled 2021-04-23 (×6): qty 1

## 2021-04-23 MED ORDER — DEXTROSE 50 % IV SOLN
12.5000 g | INTRAVENOUS | Status: AC
  Administered 2021-04-23: 12.5 g via INTRAVENOUS

## 2021-04-23 MED ORDER — DEXTROSE 50 % IV SOLN
INTRAVENOUS | Status: AC
  Filled 2021-04-23: qty 50

## 2021-04-23 MED ORDER — PEG-KCL-NACL-NASULF-NA ASC-C 100 G PO SOLR
0.5000 | Freq: Once | ORAL | Status: DC
Start: 2021-04-23 — End: 2021-04-24

## 2021-04-23 MED ORDER — FLEET ENEMA 7-19 GM/118ML RE ENEM
1.0000 | ENEMA | Freq: Once | RECTAL | Status: AC
  Administered 2021-04-23: 1 via RECTAL
  Filled 2021-04-23: qty 1

## 2021-04-23 MED ORDER — CARVEDILOL 25 MG PO TABS
25.0000 mg | ORAL_TABLET | Freq: Two times a day (BID) | ORAL | Status: DC
  Administered 2021-04-23 – 2021-04-28 (×11): 25 mg via ORAL
  Filled 2021-04-23 (×12): qty 1

## 2021-04-23 SURGICAL SUPPLY — 15 items

## 2021-04-23 NOTE — Assessment & Plan Note (Signed)
-  CT done at outside hospital showing wedge-shaped region of diminished attenuation involving the left kidney, suspicious for pyelonephritis.  Infarction felt less likely. ?-Patient is not endorsing any urinary symptoms.  No fever or leukocytosis. ?-Already treated with Zosyn ?-UA ordered ?

## 2021-04-23 NOTE — Op Note (Signed)
[facility] ?Patient Name: Benjamin Fox ?Procedure Date : 04/23/2021 ?MRN: [id number] ?Attending MD: Justice Britain , MD ?Date of Birth: 09-Apr-1955 ?CSN: [id number] ?Age: 66 ?Admit Type: Inpatient ?Procedure:                Flexible Sigmoidoscopy ?Indications:              Hematochezia, Melena ?Providers:                Justice Britain, MD, Jaci Carrel, RN,  ?                          Gloris Ham, Technician, [employer]  ?                          Technician, Technician ?Referring MD:             Dr. Serafina Royals, [facility] ?Medicines:                Monitored Anesthesia Care ?Complications:            No immediate complications. ?Estimated Blood Loss:     Estimated blood loss: none. ?Procedure:                Pre-Anesthesia Assessment: ?                          - Prior to the procedure, a History and Physical  ?                          was performed, and patient medications and  ?                          allergies were reviewed. The patient's tolerance of  ?                          previous anesthesia was also reviewed. The risks  ?                          and benefits of the procedure and the sedation  ?                          options and risks were discussed with the patient.  ?                          All questions were answered, and informed consent  ?                          was obtained. Prior Anticoagulants: The patient has  ?                          taken Plavix (clopidogrel), last dose was 4 days  ?                          prior to procedure. ASA Grade Assessment: III - A  ?  patient with severe systemic disease. After  ?                          reviewing the risks and benefits, the patient was  ?                          deemed in satisfactory condition to undergo the  ?                          procedure. ?                          After obtaining informed consent, the scope was  ?                          passed under direct  vision. The GIF-[id number]  ?                          ([id number]) Therapeutic endoscope was introduced  ?                          through the anus and advanced to the the left  ?                          transverse colon. The flexible sigmoidoscopy was  ?                          accomplished without difficulty. The patient  ?                          tolerated the procedure. The quality of the bowel  ?                          preparation was poor. ?Scope In: ?Scope Out: ?Findings: ?     The digital rectal exam findings include hemorrhoids. Pertinent  ?     negatives include no palpable rectal lesions. ?     Red blood was found in the entire colon. Lavage of the area was  ?     performed using copious amounts, resulting in incomplete clearance with  ?     continued poor visualization. There did not seem to be active bleeding  ?     however. ?     Clotted blood within what appeared to be some forming stool was found in  ?     the descending colon, at the splenic flexure and in the transverse  ?     colon. Lavage of this area led to formation of some red-blood tinged  ?     liquid however. I could not see anything beyond the transverse colon as  ?     the wall is completely caked on. ?     Non-bleeding non-thrombosed external and internal hemorrhoids were found  ?     during retroflexion, during perianal exam and during digital exam. The  ?     hemorrhoids were Grade III (internal hemorrhoids that prolapse but  ?     require manual reduction). ?Impression:               -  Preparation of the colon was poor even with  ?                          lavage. Old clot and red blood tinged fluid noted.  ?                          Active GI hemorrhage however was not appreciated  ?                          from the left colon. ?                          - Hemorrhoids found on digital rectal exam. ?                          - Blood and stool in the descending colon, at the  ?                          splenic flexure and in the  transverse colon. ?                          - Non-bleeding non-thrombosed external and internal  ?                          hemorrhoids. ?                          - Cannot rule out a lesion in the right colon or  ?                          the small bowel as source of GI blood loss. ?Recommendation:           - The patient will be observed post-procedure,  ?                          until all discharge criteria are met. ?                          - Return patient to hospital ward for ongoing care. ?                          - Clear liquid diet. ?                          - Will discuss with patient proceeding with  ?                          Colonoscopy preparation and Full Colonoscopy  ?                          tomorrow. ?                          - During preparation, patient will have red blood  ?  and clots come out per rectum. If the patient has  ?                          significant bleeding with hemodynamic compromise  ?                          then I would send him for a CT-Angiogram vs  ?                          CT-Bleeding scan. ?                          - Trend Hgb/Hct. ?                          - If Colonoscopy is performed and negative, he will  ?                          need to swallow a VCE. to ensure no other etiology  ?                          for bleeding is present. ?                          - No plans for ERCP. ?                          - His output from the drain is bloody. Appreciate  ?                          Medicine and IR thoughts on this. May benefit, from  ?                          cross-sectional imaging anyways to see how things  ?                          stand if this remains an issue. ?                          - Recommend Medicine administer IV Iron while  ?                          inhouse. ?                          - The findings and recommendations were discussed  ?                          with the patient. ?                          - The  findings and recommendations were discussed  ?                          with the referring physician. ?Procedure Code(s):        ---  Professional --- ?                          [phone], Sigmoidoscopy, flexible; diagnostic,  ?                          including collection of specimen(s) by brushing or  ?                          washing, when performed (separate procedure) ?Diagnosis Code(s):        --- Professional --- ?                          G86.2, Third degree hemorrhoids ?                          K92.2, Gastrointestinal hemorrhage, unspecified ?                          K92.1, Melena (includes Hematochezia) ?CPT copyright 2019 American Medical Association. All rights reserved. ?The codes documented in this report are preliminary and upon coder review may  ?be revised to meet current compliance requirements. ?Justice Britain, MD ?04/23/2021 4:19:24 PM ?Number of Addenda: 0 ?

## 2021-04-23 NOTE — Op Note (Signed)
[facility] ?Patient Name: Benjamin Fox ?Procedure Date : 04/23/2021 ?MRN: [id number] ?Attending MD: Justice Britain , MD ?Date of Birth: April 19, 1955 ?CSN: [id number] ?Age: 66 ?Admit Type: Inpatient ?Procedure:                Upper GI endoscopy ?Indications:              Hematemesis, Hematochezia, Active gastrointestinal  ?                          bleeding, History of Pancreatitis ?Providers:                Justice Britain, MD, Jaci Carrel, RN,  ?                          Gloris Ham, Technician, [employer]  ?                          Technician, Technician ?Referring MD:             Dr. Serafina Royals & [facility] ?Medicines:                Monitored Anesthesia Care ?Complications:            No immediate complications. ?Estimated Blood Loss:     Estimated blood loss was minimal. ?Procedure:                Pre-Anesthesia Assessment: ?                          - Prior to the procedure, a History and Physical  ?                          was performed, and patient medications and  ?                          allergies were reviewed. The patient's tolerance of  ?                          previous anesthesia was also reviewed. The risks  ?                          and benefits of the procedure and the sedation  ?                          options and risks were discussed with the patient.  ?                          All questions were answered, and informed consent  ?                          was obtained. Prior Anticoagulants: The patient has  ?                          taken Plavix (clopidogrel), last dose was 4 days  ?  prior to procedure. ASA Grade Assessment: III - A  ?                          patient with severe systemic disease. After  ?                          reviewing the risks and benefits, the patient was  ?                          deemed in satisfactory condition to undergo the  ?                          procedure. ?                          After  obtaining informed consent, the endoscope was  ?                          passed under direct vision. Throughout the  ?                          procedure, the patient's blood pressure, pulse, and  ?                          oxygen saturations were monitored continuously. The  ?                          GIF-1TH190 ([id number]) Therapeutic endoscope was  ?                          introduced through the mouth, and advanced to the  ?                          second part of duodenum. The [id number]-[id number] ([id number])  ?                          Olympus duodenoscope was introduced through the  ?                          mouth, and advanced to the area of papilla. The  ?                          upper GI endoscopy was accomplished without  ?                          difficulty. The patient tolerated the procedure. ?Scope In: ?Scope Out: ?Findings: ?     The examined esophagus was moderately tortuous. ?     No gross mucosal lesions were noted in the entire esophagus. ?     There is no endoscopic evidence of varices in the entire esophagus. ?     The Z-line was irregular and was found 44 cm from the incisors. ?     Patchy mildly erythematous mucosa without bleeding was found in the  ?     entire examined stomach. Biopsies were taken  with a cold forceps for  ?     histology and Helicobacter pylori testing. ?     Localized, moderate mucosal changes characterized by granularity,  ?     scalloping, altered texture and a decreased vascular pattern were found  ?     in the gastric antrum extending/surrounding the prepyloric region of the  ?     stomach. Biopsies were taken with a cold forceps for histology to rule  ?     out dysplasia. ?     Localized severe mucosal changes characterized by congestion, erythema,  ?     friability (with contact bleeding), granularity and altered texture were  ?     found on the contralateral wall of D2 from the major papilla. Biopsies  ?     were taken with a cold forceps for histology. ?     There was  evidence of a what appeared to be biliary and pancreatic  ?     sphincterotomies at the major papillary region. I monitored this area  ?     for a few minutes and found no evidence of hemobilia. ?     No other gross lesions were noted in the duodenal bulb, in the first  ?     portion of the duodenum and in the second portion of the duodenum. ?Impression:               - Tortuous esophagus but no gross mucosal lesions  ?                          in esophagus. Z-line irregular, 44 cm from the  ?                          incisors. ?                          - Erythematous mucosa in the stomach. Biopsied. ?                          - Granular, scalloped, texture changed and  ?                          decreased vascular pattern mucosa in the antrum  ?                          extending to the prepylorus. Biopsied. ?                          - Mucosal changes and ulceration noted on  ?                          contralateral wall to the ampulla in D2. Biopsied. ?                          - Biliary and Pancreatic sphincterotomies appear  ?                          open. No evidence of hemobilia. ?                          -  No other gross lesions in the duodenal bulb, in  ?                          the first portion of the duodenum and in the second  ?                          portion of the duodenum. ?Recommendation:           - Proceed to scheduled Flexible Sigmoidoscopy. ?                          - PPI BID should be continued. IV PPI is not  ?                          required however. ?                          - Observe patient's clinical course. ?                          - Await pathology results. ?                          - Repeat upper endoscopy in 4 months to check  ?                          healing. ?                          - The findings and recommendations were discussed  ?                          with the patient. ?                          - The findings and recommendations were discussed  ?                           with the referring physician. ?Procedure Code(s):        --- Professional --- ?                          [phone], Esophagogastroduodenoscopy, flexible,  ?                          transoral; with biopsy, single or multiple ?Diagnosis Code(s):        --- Professional --- ?                          Q39.9, Congenital malformation of esophagus,  ?                          unspecified ?                          K22.8, Other specified diseases of esophagus ?  K31.89, Other diseases of stomach and duodenum ?                          Z98.0, Intestinal bypass and anastomosis status ?                          K92.0, Hematemesis ?                          K92.1, Melena (includes Hematochezia) ?                          K92.2, Gastrointestinal hemorrhage, unspecified ?CPT copyright 2019 American Medical Association. All rights reserved. ?The codes documented in this report are preliminary and upon coder review may  ?be revised to meet current compliance requirements. ?Justice Britain, MD ?04/23/2021 4:11:58 PM ?Number of Addenda: 0 ?

## 2021-04-23 NOTE — H&P (Signed)
? ?GASTROENTEROLOGY PROCEDURE H&P NOTE  ? ?Primary Care Physician: ?Martinique, Julie M, NP ? ?HPI: ?Benjamin Fox is a 66 y.o. male who presents for EGD/Flex to evaluate for possible source of GI blood loss.  Negative recent EGD, now having marroon stools after his recent percutaneous drain placement. ? ?Past Medical History:  ?Diagnosis Date  ? Anemia of chronic disease   ? Chronic atrial fibrillation (HCC)   ? Chronic mesenteric ischemia ([place])   ? Coronary artery disease   ? Hyperlipidemia associated with type 2 diabetes mellitus ([place])   ? Hypertension associated with diabetes ([place])   ? OSA (obstructive sleep apnea)   ? Type 2 diabetes mellitus ([place])   ? ?Past Surgical History:  ?Procedure Laterality Date  ? IR AORTAGRAM ABDOMINAL SERIALOGRAM  09/15/2020  ? IR RADIOLOGIST EVAL & MGMT  07/25/2020  ? IR RADIOLOGIST EVAL & MGMT  08/28/2020  ? IR RADIOLOGIST EVAL & MGMT  10/22/2020  ? IR RADIOLOGIST EVAL & MGMT  01/08/2021  ? IR RADIOLOGIST EVAL & MGMT  04/08/2021  ? IR THROMBECT PRIM MECH INIT (INCLU) MOD SED  09/15/2020  ? IR TRANSCATH PLC STENT 1ST ART NOT LE CV CAR VERT CAR  09/15/2020  ? IR US GUIDE VASC ACCESS RIGHT  09/15/2020  ? ?Current Facility-Administered Medications  ?Medication Dose Route Frequency Provider Last Rate Last Admin  ? [MAR Hold] atorvastatin (LIPITOR) tablet 10 mg  10 mg Oral Daily Thurnell Lose, MD   10 mg at 04/23/21 1204  ? [MAR Hold] carvedilol (COREG) tablet 25 mg  25 mg Oral BID Thurnell Lose, MD   25 mg at 04/23/21 1204  ? dextrose 5 %-0.9 % sodium chloride infusion   Intravenous Continuous Rise Patience, MD 75 mL/hr at 04/23/21 0932 Infusion Verify at 04/23/21 3557  ? [MAR Hold] diltiazem (CARDIZEM CD) 24 hr capsule 240 mg  240 mg Oral q morning Thurnell Lose, MD   240 mg at 04/23/21 1203  ? [MAR Hold] hydrALAZINE (APRESOLINE) injection 10 mg  10 mg Intravenous Q6H PRN Thurnell Lose, MD      ? Doug Sou Hold] HYDROcodone-acetaminophen (NORCO) 7.5-325 MG per tablet 1 tablet   1 tablet Oral Q6H PRN Thurnell Lose, MD      ? Doug Sou Hold] HYDROmorphone (DILAUDID) injection 1 mg  1 mg Intravenous Q4H PRN Thurnell Lose, MD   1 mg at 04/23/21 1203  ? [MAR Hold] pantoprazole (PROTONIX) injection 40 mg  40 mg Intravenous Q12H Esterwood, Amy S, PA-C   40 mg at 04/23/21 1204  ? [MAR Hold] sertraline (ZOLOFT) tablet 100 mg  100 mg Oral QPM Thurnell Lose, MD      ? ? ?Current Facility-Administered Medications:  ?  [MAR Hold] atorvastatin (LIPITOR) tablet 10 mg, 10 mg, Oral, Daily, Thurnell Lose, MD, 10 mg at 04/23/21 1204 ?  [MAR Hold] carvedilol (COREG) tablet 25 mg, 25 mg, Oral, BID, Thurnell Lose, MD, 25 mg at 04/23/21 1204 ?  dextrose 5 %-0.9 % sodium chloride infusion, , Intravenous, Continuous, Rise Patience, MD, Last Rate: 75 mL/hr at 04/23/21 0618, Infusion Verify at 04/23/21 0618 ?  [MAR Hold] diltiazem (CARDIZEM CD) 24 hr capsule 240 mg, 240 mg, Oral, q morning, Thurnell Lose, MD, 240 mg at 04/23/21 1203 ?  [MAR Hold] hydrALAZINE (APRESOLINE) injection 10 mg, 10 mg, Intravenous, Q6H PRN, Thurnell Lose, MD ?  Doug Sou Hold] HYDROcodone-acetaminophen (NORCO) 7.5-325 MG per tablet 1 tablet, 1 tablet, Oral,  Q6H PRN, Thurnell Lose, MD ?  Doug Sou Hold] HYDROmorphone (DILAUDID) injection 1 mg, 1 mg, Intravenous, Q4H PRN, Thurnell Lose, MD, 1 mg at 04/23/21 1203 ?  [MAR Hold] pantoprazole (PROTONIX) injection 40 mg, 40 mg, Intravenous, Q12H, Esterwood, Amy S, PA-C, 40 mg at 04/23/21 1204 ?  [MAR Hold] sertraline (ZOLOFT) tablet 100 mg, 100 mg, Oral, QPM, Candiss Norse, Margaree Mackintosh, MD ?No Known Allergies ?Family History  ?Problem Relation Age of Onset  ? Hypertension Father   ? Heart failure Father   ? Colon cancer Neg Hx   ? Esophageal cancer Neg Hx   ? Inflammatory bowel disease Neg Hx   ? Liver disease Neg Hx   ? Pancreatic cancer Neg Hx   ? Rectal cancer Neg Hx   ? Stomach cancer Neg Hx   ? ?Social History  ? ?Socioeconomic History  ? Marital status: Married  ?  Spouse  name: Not on file  ? Number of children: Not on file  ? Years of education: Not on file  ? Highest education level: Not on file  ?Occupational History  ? Not on file  ?Tobacco Use  ? Smoking status: Former  ?  Types: Cigarettes  ? Smokeless tobacco: Never  ?Vaping Use  ? Vaping Use: Never used  ?Substance and Sexual Activity  ? Alcohol use: Not Currently  ? Drug use: Never  ? Sexual activity: Not on file  ?Other Topics Concern  ? Not on file  ?Social History Narrative  ? Not on file  ? ?Social Determinants of Health  ? ?Financial Resource Strain: Not on file  ?Food Insecurity: Not on file  ?Transportation Needs: Not on file  ?Physical Activity: Not on file  ?Stress: Not on file  ?Social Connections: Not on file  ?Intimate Partner Violence: Not on file  ? ? ?Physical Exam: ?Today's Vitals  ? 04/23/21 0845 04/23/21 1000 04/23/21 1202 04/23/21 1235  ?BP:  (!) 150/90 (!) 154/95   ?Pulse:  85 82   ?Resp:  18 17   ?Temp:      ?TempSrc:      ?SpO2:  95% 94%   ?PainSc: '5  7  7  '$ 0-No pain  ? ?There is no height or weight on file to calculate BMI. ?GEN: NAD ?EYE: Sclerae anicteric ?ENT: MMM ?CV: Non-tachycardic ?GI: Soft, NT/ND ?NEURO:  Alert & Oriented x 3 ? ?Lab Results: ?Recent Labs  ?  04/23/21 ?0305 04/23/21 ?1018  ?WBC 6.7 6.6  ?HGB 8.6* 9.2*  ?HCT 25.9* 27.9*  ?PLT 260 279  ? ?BMET ?Recent Labs  ?  04/23/21 ?0305  ?NA 141  ?K 3.2*  ?CL 102  ?CO2 28  ?GLUCOSE 76  ?BUN 6*  ?CREATININE 0.64  ?CALCIUM 7.9*  ? ?LFT ?Recent Labs  ?  04/23/21 ?0305  ?PROT 5.1*  ?ALBUMIN 1.9*  ?AST 19  ?ALT 13  ?ALKPHOS 140*  ?BILITOT 1.1  ? ?PT/INR ?Recent Labs  ?  04/23/21 ?0305  ?LABPROT 15.3*  ?INR 1.2  ? ? ? ?Impression / Plan: ?This is a 66 y.o.male who presents for EGD/Flex to evaluate for possible source of GI blood loss.  Negative recent EGD, now having marroon stools after his recent percutaneous drain placement. ? ?The risks and benefits of endoscopic evaluation/treatment were discussed with the patient and/or family; these include  but are not limited to the risk of perforation, infection, bleeding, missed lesions, lack of diagnosis, severe illness requiring hospitalization, as well as anesthesia and sedation related illnesses.  The patient's history has been reviewed, patient examined, no change in status, and deemed stable for procedure.  The patient and/or family is agreeable to proceed.  ? ? ?Justice Britain, MD ?[facility] ?Advanced Endoscopy ?Office # [phone] ? ?

## 2021-04-23 NOTE — Anesthesia Procedure Notes (Signed)
Procedure Name: [place] ?Date/Time: 04/23/2021 3:15 PM ?Performed by: Carolan Clines, CRNA ?Pre-anesthesia Checklist: Patient identified, Emergency Drugs available, Suction available and Patient being monitored ?Patient Re-evaluated:Patient Re-evaluated prior to induction ?Oxygen Delivery Method: Nasal cannula ?Dental Injury: Teeth and Oropharynx as per pre-operative assessment  ? ? ? ? ?

## 2021-04-23 NOTE — Progress Notes (Signed)
Large bloody bowel movement noted in toilet.  ?Gastroenterology PA Amy Easterwood at bedside and visualized bowel movement.  ?Attending MD Dr. Candiss Norse made aware of bowel movement.  ?

## 2021-04-23 NOTE — Assessment & Plan Note (Signed)
-  Stable.  Not endorsing any anginal symptoms. ?-Hold aspirin at this time. ?

## 2021-04-23 NOTE — Progress Notes (Signed)
?                                  PROGRESS NOTE                                             ?                                                                                                                     ?                                         ? ? Patient Demographics:  ? ? Benjamin Fox, is a 66 y.o. male, DOB - [birth year], IDP:[id number] ? ?Outpatient Primary MD for the patient is Martinique, Julie M, NP    LOS - 1  Admit date - 04/22/2021   ? ?Transfer from [facility] for right upper quadrant possible liver laceration bleed.    ? ?Brief Narrative (HPI from H&P)   -  66 y.o. male with history of  chronic A-fib on Xarelto, chronic mesenteric ischemia status post celiac and SMA stenting and subsequent thrombectomy for occluded stents 08/2020, CAD status post RCA stenting, type 2 diabetes, hypertension, hyperlipidemia, depression, anxiety, OSA on CPAP, status post prior gastric band (now removed), recurrent idiopathic pancreatitis (complicated by previous leak requiring PD stenting and PD sphincterotomy), previous pancreatic pseudoaneurysm, appendiceal abscess,  just recently known to Dr. Rush Landmark, after initial office evaluation on 03/26/2021 for recurrent idiopathic pancreatitis.  Patient has a complicated GI history, with underlying history of chronic atrial fibrillation for which she had been on Xarelto, coronary artery disease status post RCA stents, history of chronic mesenteric ischemia status post celiac and mesenteric stents for which she has been on Plavix, sleep apnea, adult onset diabetes mellitus and hypertension. Had multiple admissions with pancreatitis, most recent in December 2022.  After recent office consultation plan was for MRI/MRCP, then possible ERCP. ? ?Patient unfortunately was rehospitalized on 04/14/2021 at [facility] with acute abdominal pain nausea and vomiting. Initial lipase was 433, CT of the abdomen pelvis on the day of admission  showed mild acute on chronic pancreatitis, evidence of cirrhosis intra-abdominal varices, splenomegaly ascites and a distended gallbladder. ? ?Repeat CT on 04/19/2021 showed persistent recurrent acute on chronic pancreatitis reactive changes in the duodenal sweep, and interval enlargement of a right hip perihepatic/subcapsular fluid collection measuring 24 x 11 cm, also loculated right pleural effusion and right lower lobe consolidation. ? ?On 04/21/2021 he had a large volume hematemesis and underwent EGD there which showed gastritis and focal duodenitis, no active bleeding and no esophageal or gastric varices. ? ?On the following day he underwent IR drainage of the perihepatic fluid collection  and immediately had about 1500 cc of bright red blood output from the drain.  He required 2 units of packed RBCs.  Decision was made to transfer here.  Hemoglobin was 8.5 after 2 units.  He has been covered by with Zosyn. ? ? ? Subjective:  ? ? [name] today has, No headache, No chest pain, +ve RUQ abdominal pain - No Nausea, No new weakness tingling or numbness, no SOB. ? ? Assessment  & Plan :  ? ? ?Abdominal pain patient with history of recurrent idiopathic pancreatitis, pancreatic pseudoaneurysm, recent upper GI bleed at [facility] with EGD showing gastritis and focal duodenitis, CT scan at [facility] on 04/19/2021 showing right perihepatic fluid collection s/p IR drainage of fluid collection at [facility] on 04/20/2021 with bloody fluid coming out. ?  ?- Due to his recent upper GI bleed and now bloody drainage from his right upper quadrant perihepatic drain his Plavix and Xarelto are on hold, he has received 2 units of packed RBC at [facility] on 04/22/2021.  Continue to monitor H&H, IV PPI, transfuse as needed to keep hemoglobin around 7.5, IR and GI both on board.  Going for EGD on 04/23/2021.  Continue Zosyn for perihepatic fluid collection for now. ? ? ?Abnormal finding on EGD done at  [facility].  EGD done at outside hospital showing indeterminate left pyriform sinus lesion, may represent eroded mucosa in the setting of dehydration though oropharyngeal malignancy not excluded.  PCP to ensure outpatient ENT follow-up. ? ?Right lower pleural effusion - likely sympathetic effusion from perihepatic fluid collection, no cough or pleuritic symptoms, likely has compressive atelectasis will continue to monitor for now on Zosyn which will be continued. ?  ?Coronary artery disease -Stable.  Not endorsing any anginal symptoms, aspirin on hold, resume beta-blocker and statin. ?  ?Paroxysmal A-fib [place] vas 2 score of greater than 3.  Half Home dose beta-blocker, calcium channel blocker and Xarelto once bleeding has resolved ? ?History of chronic mesenteric ischemia, SMA stenting and subsequent thrombectomy of occluded stents in 08/2020.  Aspirin and statin along with anticoagulation when able to ? ?Abnormal finding on CT scan - -CT done at outside hospital showing wedge-shaped region of diminished attenuation involving the left kidney, suspicious for pyelonephritis.  Infarction felt less likely, no dysuria for now covered with Zosyn for #1 above.  UA unremarkable.  Question if this is infarction. ?  ?HTN (hypertension) - half home dose beta-blocker, home dose calcium channel blocker along with as needed IV hydralazine. ?  ?OSA on CPAP -Continue nightly CPAP ? ?Type 2 diabetes mellitus (HCC)  - A1c 5.8 on 04/15/2021 on labs done at outside facility, gentle D5 drip and no insulin continue to monitor. ? ?CBG (last 3)  ?Recent Labs  ?  04/23/21 ?9562 04/23/21 ?0631 04/23/21 ?0834  ?GLUCAP 92 89 78  ? ?  ? ?   ? ?Condition - Extremely Guarded ? ?Family Communication  :  None present ? ?Code Status :  Full ? ?Consults  :  IR, GI ? ?PUD Prophylaxis : PPI ? ? Procedures  :    ? ?EGD  04/13/21 -  ? ?   ? ?Disposition Plan  :   ? ?Status is: Inpatient ? ? ?DVT Prophylaxis  :   ? ?SCDs Start: 04/22/21 2122 ?  ? ?Lab  Results  ?Component Value Date  ? PLT 260 04/23/2021  ? ? ?Diet :  ?Diet Order   ? ?       ?  Diet NPO time specified  Diet effective now       ?  ? ?  ?  ? ?  ?  ? ?Inpatient Medications ? ?Scheduled Meds: ? atorvastatin  10 mg Oral Daily  ? carvedilol  25 mg Oral BID  ? diltiazem  240 mg Oral q morning  ? pantoprazole (PROTONIX) IV  40 mg Intravenous Q12H  ? sertraline  100 mg Oral QPM  ? ?Continuous Infusions: ? dextrose 5 % and 0.9% NaCl 75 mL/hr at 04/23/21 0618  ? potassium chloride 10 mEq (04/23/21 0945)  ? ?PRN Meds:.hydrALAZINE, HYDROcodone-acetaminophen, HYDROmorphone (DILAUDID) injection ? ?Antibiotics  :   ? ?Anti-infectives (From admission, onward)  ? ? None  ? ?  ? ? ? Time Spent in minutes  30 ? ? ?Lala Lund M.D on 04/23/2021 at 10:22 AM ? ?To page go to [email or web address]  ? ?[facility] -  Office  [phone] ? ?See all Orders from today for further details ? ? ? Objective:  ? ?Vitals:  ? 04/22/21 2000 04/23/21 0100 04/23/21 0324 04/23/21 0805  ?BP: 140/86 (!) 141/81 130/88 (!) 142/95  ?Pulse:  73 70 87  ?Resp: '19 16 12 19  '$ ?Temp: 98.7 ?F (37.1 ?C) 97.8 ?F (36.6 ?C) 97.8 ?F (36.6 ?C)   ?TempSrc: Oral Oral Oral   ?SpO2: 96% 95% 95% 94%  ? ? ?Wt Readings from Last 3 Encounters:  ?03/26/21 113.4 kg  ?01/21/21 129.7 kg  ?09/15/20 104.3 kg  ? ? ? ?Intake/Output Summary (Last 24 hours) at 04/23/2021 1022 ?Last data filed at 04/23/2021 0900 ?Gross per 24 hour  ?Intake 599.25 ml  ?Output 800 ml  ?Net -200.75 ml  ? ? ? ?Physical Exam ? ?Awake Alert, No new F.N deficits, Normal affect ?[place].AT,PERRAL ?Supple Neck, No JVD,   ?Symmetrical Chest wall movement, Good air movement bilaterally, CTAB ?RRR,No Gallops,Rubs or new Murmurs,  ?+ve B.Sounds, Abd Soft, No tenderness,   ?No Cyanosis, Clubbing or edema  ?  ? ?RN pressure injury documentation: ?  ? ? Data Review:  ? ? ?CBC ?Recent Labs  ?Lab 04/23/21 ?0305  ?WBC 6.7  ?HGB 8.6*  ?HCT 25.9*  ?PLT 260  ?MCV 89.9  ?MCH 29.9  ?MCHC 33.2  ?RDW 16.8*   ? ? ?Electrolytes ?Recent Labs  ?Lab 04/23/21 ?0305  ?NA 141  ?K 3.2*  ?CL 102  ?CO2 28  ?GLUCOSE 76  ?BUN 6*  ?CREATININE 0.64  ?CALCIUM 7.9*  ?AST 19  ?ALT 13  ?ALKPHOS 140*  ?BILITOT 1.1  ?ALBUMIN 1.9*  ?INR 1.2  ?

## 2021-04-23 NOTE — Significant Event (Signed)
Patient remained hypoglycemic.  At this time we will start D5 normal saline, hold insulin coverage check CBGs q. hourly. ? ?Gean Birchwood ?

## 2021-04-23 NOTE — Progress Notes (Signed)
Dr. Hal Hope came to the bedside to talk to the patient but he still refused the prep for the night. Patient finally apologized for his behavior on the staff. Will continue to monitor. ?

## 2021-04-23 NOTE — Transfer of Care (Signed)
Immediate Anesthesia Transfer of Care Note ? ?Patient: Benjamin Fox ? ?Procedure(s) Performed: ESOPHAGOGASTRODUODENOSCOPY (EGD) WITH PROPOFOL ?FLEXIBLE SIGMOIDOSCOPY ?BIOPSY ? ?Patient Location: Endoscopy Unit ? ?Anesthesia Type:MAC ? ?Level of Consciousness: drowsy ? ?Airway & Oxygen Therapy: Patient Spontanous Breathing and Patient connected to face mask oxygen ? ?Post-op Assessment: Report given to RN and Post -op Vital signs reviewed and stable ? ?Post vital signs: Reviewed and stable ? ?Last Vitals:  ?Vitals Value Taken Time  ?BP    ?Temp    ?Pulse 78 04/23/21 1609  ?Resp 16 04/23/21 1609  ?SpO2 98 % 04/23/21 1609  ?Vitals shown include unvalidated device data. ? ?Last Pain:  ?Vitals:  ? 04/23/21 1356  ?TempSrc: Temporal  ?PainSc: 0-No pain  ?   ? ?Patients Stated Pain Goal: 2 (04/23/21 0324) ? ?Complications: No notable events documented. ?

## 2021-04-23 NOTE — Consult Note (Signed)
? ? ?Consultation ? ?Referring Provider: TRH/Rathore MD ?Primary Care Physician:  Martinique, Julie M, NP ?Primary Gastroenterologist:  Dr. Rush Landmark ? ?Reason for Consultation: GI bleed, hematemesis 04/21/2021, acute bleed with IR drainage of loculated perihepatic fluid collection 04/22/2021 ? ?HPI: Benjamin Fox is a 66 y.o. male just recently known to Dr. Rush Landmark, after initial office evaluation on 03/26/2021 for recurrent idiopathic pancreatitis.  Patient has a complicated GI history, with underlying history of chronic atrial fibrillation for which she had been on Xarelto, coronary artery disease status post RCA stents, history of chronic mesenteric ischemia status post celiac and mesenteric stents for which she has been on Plavix, sleep apnea, adult onset diabetes mellitus and hypertension. ?Had multiple admissions with pancreatitis, most recent in December 2022.  After recent office consultation plan was for MRI/MRCP, then possible ERCP. ?Patient unfortunately was rehospitalized on 04/14/2021 at [facility] with acute abdominal pain nausea and vomiting.  Initial lipase was 433, CT of the abdomen pelvis on the day of admission showed mild acute on chronic pancreatitis, evidence of cirrhosis intra-abdominal varices, splenomegaly ascites and a distended gallbladder. ?Repeat CT on 04/19/2021 showed persistent recurrent acute on chronic pancreatitis reactive changes in the duodenal sweep, and interval enlargement of a right hip perihepatic/subcapsular fluid collection measuring 24 x 11 cm, also loculated right pleural effusion and right lower lobe consolidation. ?On 04/21/2021 he had a large volume hematemesis and underwent EGD there which showed gastritis and focal duodenitis, no active bleeding and no esophageal or gastric varices. ?On the following day he underwent IR drainage of the perihepatic fluid collection and immediately had about 1500 cc of bright red blood output from the drain.  He required 2  units of packed RBCs.  Decision was made to transfer here.  Hemoglobin was 8.5 after 2 units.  He has been covered by with Zosyn. ?Patient had been on Plavix and Xarelto, says his last dose was 3 days ago. ? ?He has been hemodynamically stable since transfer overnight, hemoglobin 8.6 at 3 AM, WBC 6.7, LFTs within normal limits/alk phos 140, INR 1.2/lipase 172 ? ?Not had any further active bleeding from the drain but does have about 200 cc of red blood in the bag. ?No further hematemesis but this morning passed a large bright red/dark red bloody stool which I saw in the commode. ?He is requiring morphine for pain and complains of pain at the drain site, and in the right upper and right mid abdomen. ? ?IR has evaluated this morning, note pending ? ? ?Past Medical History:  ?Diagnosis Date  ? Anemia of chronic disease   ? Chronic atrial fibrillation (HCC)   ? Chronic mesenteric ischemia ([place])   ? Coronary artery disease   ? Hyperlipidemia associated with type 2 diabetes mellitus ([place])   ? Hypertension associated with diabetes ([place])   ? OSA (obstructive sleep apnea)   ? Type 2 diabetes mellitus ([place])   ? ? ?Past Surgical History:  ?Procedure Laterality Date  ? IR AORTAGRAM ABDOMINAL SERIALOGRAM  09/15/2020  ? IR RADIOLOGIST EVAL & MGMT  07/25/2020  ? IR RADIOLOGIST EVAL & MGMT  08/28/2020  ? IR RADIOLOGIST EVAL & MGMT  10/22/2020  ? IR RADIOLOGIST EVAL & MGMT  01/08/2021  ? IR RADIOLOGIST EVAL & MGMT  04/08/2021  ? IR THROMBECT PRIM MECH INIT (INCLU) MOD SED  09/15/2020  ? IR TRANSCATH PLC STENT 1ST ART NOT LE CV CAR VERT CAR  09/15/2020  ? IR US GUIDE VASC ACCESS RIGHT  09/15/2020  ? ? ?  Prior to Admission medications   ?Medication Sig Start Date End Date Taking? Authorizing Provider  ?acetaminophen (TYLENOL) 500 MG tablet Take 2 tablets (1,000 mg total) by mouth every 6 (six) hours as needed for mild pain (or Fever >/= 101). 09/12/20   Saverio Danker, PA-C  ?atorvastatin (LIPITOR) 10 MG tablet Take 1 tablet (10 mg total) by mouth  daily. 01/21/21   Barb Merino, MD  ?carvedilol (COREG) 25 MG tablet Take 25 mg by mouth 2 (two) times daily. 10/31/20   [provider]  ?clopidogrel (PLAVIX) 75 MG tablet Take 1 tablet (75 mg total) by mouth daily. ?Patient taking differently: Take 75 mg by mouth every morning. 09/17/20   Theresa Duty, NP  ?Coenzyme Q10 100 MG capsule Take 100 mg by mouth every morning.    [provider]  ?Cyanocobalamin 5000 MCG SUBL Place 5,000 mcg under the tongue every morning. 10/28/15   [provider]  ?diltiazem (CARDIZEM CD) 240 MG 24 hr capsule Take 240 mg by mouth every morning. 07/20/20   [provider]  ?fenofibrate (TRICOR) 145 MG tablet Take 145 mg by mouth every morning. 11/06/20   [provider]  ?furosemide (LASIX) 20 MG tablet Take 10 mg by mouth every morning. 08/18/15   [provider]  ?lisinopril (ZESTRIL) 30 MG tablet Take 30 mg by mouth 2 (two) times daily. 07/16/20   [provider]  ?metFORMIN (GLUCOPHAGE) 500 MG tablet Take 500 mg by mouth 2 (two) times daily. 01/12/21   [provider]  ?oxyCODONE (OXY IR/ROXICODONE) 5 MG immediate release tablet Take 1 tablet (5 mg total) by mouth every 4 (four) hours as needed for moderate pain. 01/21/21   Barb Merino, MD  ?pantoprazole (PROTONIX) 40 MG tablet Take 40 mg by mouth 2 (two) times daily. 04/12/20   [provider]  ?rivaroxaban (XARELTO) 20 MG TABS tablet Take 20 mg by mouth every evening.    [provider]  ?sertraline (ZOLOFT) 100 MG tablet Take 100 mg by mouth every evening. 08/05/20   [provider]  ? ? ?Current Facility-Administered Medications  ?Medication Dose Route Frequency Provider Last Rate Last Admin  ? dextrose 5 %-0.9 % sodium chloride infusion   Intravenous Continuous Rise Patience, MD 75 mL/hr at 04/23/21 3220 Infusion Verify at 04/23/21 2542  ? hydrALAZINE (APRESOLINE) injection 5 mg  5 mg Intravenous Q4H PRN Shela Leff, MD      ? morphine (PF) 2 MG/ML injection 1 mg  1 mg Intravenous Q4H PRN Shela Leff, MD   1 mg at 04/23/21 7062  ? potassium chloride 10 mEq in 100 mL IVPB  10 mEq Intravenous Q1 Hr x 4 Shela Leff, MD 100 mL/hr at 04/23/21 0813 10 mEq at 04/23/21 0813  ? ? ?Allergies as of 04/22/2021  ? (No Known Allergies)  ? ? ?Family History  ?Problem Relation Age of Onset  ? Hypertension Father   ? Heart failure Father   ? Colon cancer Neg Hx   ? Esophageal cancer Neg Hx   ? Inflammatory bowel disease Neg Hx   ? Liver disease Neg Hx   ? Pancreatic cancer Neg Hx   ? Rectal cancer Neg Hx   ? Stomach cancer Neg Hx   ? ? ?Social History  ? ?Socioeconomic History  ? Marital status: Married  ?  Spouse name: Not on file  ? Number of children: Not on file  ? Years of education: Not on file  ? Highest  education level: Not on file  ?Occupational History  ? Not on file  ?Tobacco Use  ? Smoking status: Former  ?  Types: Cigarettes  ? Smokeless tobacco: Never  ?Vaping Use  ? Vaping Use: Never used  ?Substance and Sexual Activity  ? Alcohol use: Not Currently  ? Drug use: Never  ? Sexual activity: Not on file  ?Other Topics Concern  ? Not on file  ?Social History Narrative  ? Not on file  ? ?Social Determinants of Health  ? ?Financial Resource Strain: Not on file  ?Food Insecurity: Not on file  ?Transportation Needs: Not on file  ?Physical Activity: Not on file  ?Stress: Not on file  ?Social Connections: Not on file  ?Intimate Partner Violence: Not on file  ? ? ?Review of Systems: ?Pertinent positive and negative review of systems were noted in the above HPI section.  All other review of systems was otherwise negative.  ? ?Physical Exam: ?Vital signs in last 24 hours: ?Temp:  [97.8 ?F (36.6 ?C)-98.7 ?F (37.1 ?C)] 97.8 ?F (36.6 ?C) (03/30 0324) ?Pulse Rate:  [70-87] 87 (03/30 0805) ?Resp:  [12-19] 19 (03/30 0805) ?BP: (130-154)/(81-100) 142/95 (03/30 0805) ?SpO2:  [94 %-96 %] 94 % (03/30 0805) ?Last BM Date :  04/21/21 ?General:   Alert,  Well-developed, well-nourished, ill-appearing older white male pleasant and cooperative in NAD ?Head:  Normocephalic and atraumatic. ?Eyes:  Sclera clear, no icterus.   Conjunctiva pink.

## 2021-04-23 NOTE — Anesthesia Preprocedure Evaluation (Signed)
Anesthesia Evaluation  ?Patient identified by MRN, date of birth, ID band ?Patient awake ? ? ? ?Reviewed: ?Allergy & Precautions, NPO status , Patient's Chart, lab work & pertinent test results, reviewed documented beta blocker date and time  ? ?Airway ?Mallampati: I ? ? ? ? ? ? Dental ?no notable dental hx. ? ?  ?Pulmonary ?former smoker,  ?  ?Pulmonary exam normal ? ? ? ? ? ? ? Cardiovascular ?hypertension, Pt. on medications and Pt. on home beta blockers ?+ CAD and + Cardiac Stents  ? ?Rhythm:Regular Rate:Normal ? ? ?  ?Neuro/Psych ?negative neurological ROS ? negative psych ROS  ? GI/Hepatic ?Neg liver ROS,   ?Endo/Other  ?diabetes, Type 2, Oral Hypoglycemic Agents ? Renal/GU ?negative Renal ROS  ?negative genitourinary ?  ?Musculoskeletal ?negative musculoskeletal ROS ?(+)  ? Abdominal ?(+) + obese,   ?Peds ? Hematology ? ?(+) Blood dyscrasia, anemia ,   ?Anesthesia Other Findings ? ? Reproductive/Obstetrics ? ?  ? ? ? ? ? ? ? ? ? ? ? ? ? ?  ?  ? ? ? ? ? ? ? ? ?Anesthesia Physical ?Anesthesia Plan ? ?ASA: 2 ? ?Anesthesia Plan: MAC  ? ?Post-op Pain Management: Minimal or no pain anticipated  ? ?Induction: Intravenous ? ?PONV Risk Score and Plan: 1 and Propofol infusion and TIVA ? ?Airway Management Planned: Natural Airway, Mask and Simple Face Mask ? ?Additional Equipment: None ? ?Intra-op Plan:  ? ?Post-operative Plan:  ? ?Informed Consent: I have reviewed the patients History and Physical, chart, labs and discussed the procedure including the risks, benefits and alternatives for the proposed anesthesia with the patient or authorized representative who has indicated his/her understanding and acceptance.  ? ? ? ? ? ?Plan Discussed with: CRNA ? ?Anesthesia Plan Comments:   ? ? ? ? ? ? ?Anesthesia Quick Evaluation ? ?

## 2021-04-23 NOTE — Assessment & Plan Note (Addendum)
-  Pharmacy med rec pending. ?-IV hydralazine prn SBP >180.  ?

## 2021-04-23 NOTE — Assessment & Plan Note (Signed)
-   EGD done at outside hospital showing indeterminate left pyriform sinus lesion, may represent eroded mucosa in the setting of dehydration though oropharyngeal malignancy not excluded. ?-Please ensure outpatient ENT follow-up. ?

## 2021-04-23 NOTE — Anesthesia Postprocedure Evaluation (Signed)
Anesthesia Post Note ? ?Patient: Alexios Keown ? ?Procedure(s) Performed: ESOPHAGOGASTRODUODENOSCOPY (EGD) WITH PROPOFOL ?FLEXIBLE SIGMOIDOSCOPY ?BIOPSY ? ?  ? ?Patient location during evaluation: Endoscopy ?Anesthesia Type: MAC ?Level of consciousness: awake ?Pain management: pain level controlled ?Vital Signs Assessment: post-procedure vital signs reviewed and stable ?Respiratory status: spontaneous breathing ?Cardiovascular status: stable ?Postop Assessment: no apparent nausea or vomiting ?Anesthetic complications: no ? ? ?No notable events documented. ? ?Last Vitals:  ?Vitals:  ? 04/23/21 1618 04/23/21 1628  ?BP: 120/66 129/76  ?Pulse: 83 68  ?Resp: 16 17  ?Temp:    ?SpO2: 92% 92%  ?  ?Last Pain:  ?Vitals:  ? 04/23/21 1618  ?TempSrc:   ?PainSc: 0-No pain  ? ? ?  ?  ?  ?  ?  ?  ? ?Huston Foley ? ? ? ? ?

## 2021-04-23 NOTE — Consult Note (Signed)
? ?Chief Complaint: ?Patient was seen in consultation today for perihepatic fluid collection.  ? ?Referring Physician(s): ?Dr. Shela Leff ? ?Supervising Physician: Ruthann Cancer ? ?Patient Status: [facility] - In-pt ? ?History of Present Illness: ?Benjamin Fox is a 66 y.o. 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  Stents thrombosed, however the SMA was recannalized by Dr. Serafina Royals 08/2020.  The patient is currently on Plavix.  Last follow-up with Dr. Serafina Royals was 04/08/21 at which time patient was stable with intermittent, sharp abdominal pain but patent stent by CT imaging.  He was planning to undergo ERCP with Dr. Rush Landmark in May of this year for possible pancreatic stenting however presented to [facility] in [place] 3/21 with severe abdominal pain, nausea, and vomiting. Lipase on admission was 433 with CT evidence of acute on chronic pancreatitis and concern for cirrhosis, varices, and splenomegaly.  Repeat imaging 3/26 show enlargement of a right perihepatic fluid collection.  On 3/28 he had a reported large volume hematemesis prompting EGD which was did not show active bleeding or varices. The following day, 3/29, he underwent CT-guided drainage of the right perihepatic fluid collection with removal of 1500 mL of  bloody aspirate. He had an associated drop in hemoglobin with episode of hypotension and was transfused 2u PRBC.  Hgb now 8.5.  Patient was transferred to [facility] for further management. He has been assessed by GI this AM who observed a large bright red bloody stool and are planning to proceed with ERCP.  ? ?IR consulted for patient with recent drain placement, known to service due to extensive history and prior procedure 08/2020.  PA to bedside to discuss status with patient. RUQ  drain remains intact with ongoing significant output.  He reports mild pain at the drain insertion site.   ? ?NPO for ERCP this afternoon.  ? ? ?Past Medical History:  ?Diagnosis Date  ? Anemia of chronic disease   ? Chronic atrial fibrillation (HCC)   ? Chronic mesenteric ischemia ([place])   ? Coronary artery disease   ? Hyperlipidemia associated with type 2 diabetes mellitus ([place])   ? Hypertension associated with diabetes ([place])   ? OSA (obstructive sleep apnea)   ? Type 2 diabetes mellitus ([place])   ? ? ?Past Surgical History:  ?Procedure Laterality Date  ? IR AORTAGRAM ABDOMINAL SERIALOGRAM  09/15/2020  ? IR RADIOLOGIST EVAL & MGMT  07/25/2020  ? IR RADIOLOGIST EVAL & MGMT  08/28/2020  ? IR RADIOLOGIST EVAL & MGMT  10/22/2020  ? IR RADIOLOGIST EVAL & MGMT  01/08/2021  ? IR RADIOLOGIST EVAL & MGMT  04/08/2021  ? IR THROMBECT PRIM MECH INIT (INCLU) MOD SED  09/15/2020  ? IR TRANSCATH PLC STENT 1ST ART NOT LE CV CAR VERT CAR  09/15/2020  ? IR US GUIDE VASC ACCESS RIGHT  09/15/2020  ? ? ?Allergies: ?Patient has no known allergies. ? ?Medications: ?Prior to Admission medications   ?Medication Sig Start Date End Date Taking? Authorizing Provider  ?acetaminophen (TYLENOL) 500 MG tablet Take 2 tablets (1,000 mg total) by mouth every 6 (six) hours as needed for mild pain (or Fever >/= 101). 09/12/20  Yes Saverio Danker, PA-C  ?atorvastatin (LIPITOR) 10 MG tablet Take 1 tablet (10 mg total) by mouth daily. 01/21/21  Yes Barb Merino, MD  ?carvedilol (COREG) 25  MG tablet Take 25 mg by mouth 2 (two) times daily. 10/31/20  Yes [provider]  ?clopidogrel (PLAVIX) 75 MG tablet Take 1 tablet (75 mg total) by mouth daily. ?Patient taking differently: Take 75 mg by mouth every morning. 09/17/20  Yes Theresa Duty, NP  ?Coenzyme Q10 100 MG capsule Take 100 mg by mouth every morning.   Yes [provider]  ?Cyanocobalamin 5000 MCG SUBL Place 5,000 mcg under the tongue every morning. 10/28/15  Yes [provider]   ?diltiazem (CARDIZEM CD) 240 MG 24 hr capsule Take 240 mg by mouth every morning. 07/20/20  Yes [provider]  ?fenofibrate (TRICOR) 145 MG tablet Take 145 mg by mouth every morning. 11/06/20  Yes [provider]  ?furosemide (LASIX) 20 MG tablet Take 20 mg by mouth daily. 03/30/21  Yes [provider]  ?lisinopril (ZESTRIL) 30 MG tablet Take 30 mg by mouth 2 (two) times daily. 07/16/20  Yes [provider]  ?metFORMIN (GLUCOPHAGE) 500 MG tablet Take 500 mg by mouth 2 (two) times daily. 01/12/21  Yes [provider]  ?oxyCODONE (OXY IR/ROXICODONE) 5 MG immediate release tablet Take 1 tablet (5 mg total) by mouth every 4 (four) hours as needed for moderate pain. 01/21/21  Yes Barb Merino, MD  ?pantoprazole (PROTONIX) 40 MG tablet Take 40 mg by mouth daily. 04/12/20  Yes [provider]  ?rivaroxaban (XARELTO) 20 MG TABS tablet Take 20 mg by mouth every evening.   Yes [provider]  ?sertraline (ZOLOFT) 100 MG tablet Take 100 mg by mouth every evening. 08/05/20  Yes [provider]  ?  ? ?Family History  ?Problem Relation Age of Onset  ? Hypertension Father   ? Heart failure Father   ? Colon cancer Neg Hx   ? Esophageal cancer Neg Hx   ? Inflammatory bowel disease Neg Hx   ? Liver disease Neg Hx   ? Pancreatic cancer Neg Hx   ? Rectal cancer Neg Hx   ? Stomach cancer Neg Hx   ? ? ?Social History  ? ?Socioeconomic History  ? Marital status: Married  ?  Spouse name: Not on file  ? Number of children: Not on file  ? Years of education: Not on file  ? Highest education level: Not on file  ?Occupational History  ? Not on file  ?Tobacco Use  ? Smoking status: Former  ?  Types: Cigarettes  ? Smokeless tobacco: Never  ?Vaping Use  ? Vaping Use: Never used  ?Substance and Sexual Activity  ? Alcohol use: Not Currently  ? Drug use: Never  ? Sexual activity: Not on file  ?Other Topics Concern  ? Not on file  ?Social History Narrative  ? Not on file   ? ?Social Determinants of Health  ? ?Financial Resource Strain: Not on file  ?Food Insecurity: Not on file  ?Transportation Needs: Not on file  ?Physical Activity: Not on file  ?Stress: Not on file  ?Social Connections: Not on file  ? ? ? ?Review of Systems: A 12 point ROS discussed and pertinent positives are indicated in the HPI above.  All other systems are negative. ? ?Review of Systems  ?Constitutional:  Negative for fatigue and fever.  ?Respiratory:  Negative for cough and shortness of breath.   ?Cardiovascular:  Negative for chest pain.  ?Gastrointestinal:  Positive for abdominal pain, nausea and vomiting.  ?Genitourinary:  Negative for dysuria.  ?Musculoskeletal:  Negative for back pain.  ?Psychiatric/Behavioral:  Negative for behavioral problems and confusion.   ? ?Vital Signs: ?BP (!) 154/95 (BP Location: Right Arm)   Pulse 82   Temp 97.8 ?F (36.6 ?C) (Oral)   Resp 17   SpO2 94%  ? ?Physical Exam ?Vitals and nursing note reviewed.  ?Constitutional:   ?   General: He is not in acute distress. ?   Appearance: Normal appearance. He is not ill-appearing.  ?HENT:  ?   Mouth/Throat:  ?   Mouth: Mucous membranes are moist.  ?   Pharynx: Oropharynx is clear.  ?Cardiovascular:  ?   Rate and Rhythm: Normal rate and regular rhythm.  ?Pulmonary:  ?   Effort: Pulmonary effort is normal.  ?   Breath sounds: Normal breath sounds.  ?Abdominal:  ?   General: Abdomen is flat.  ?   Palpations: Abdomen is soft.  ?   Comments: RUQ drain in place. Sero-sanguinous output in bag. Flushes and aspirates easily.   ?Skin: ?   General: Skin is warm and dry.  ?Neurological:  ?   General: No focal deficit present.  ?   Mental Status: He is alert and oriented to person, place, and time.  ?Psychiatric:     ?   Mood and Affect: Mood normal.     ?   Behavior: Behavior normal.     ?   Thought Content: Thought content normal.     ?   Judgment: Judgment normal.  ? ? ?Imaging: ?DG Chest 2 View ? ?Result Date: 04/23/2021 ?CLINICAL DATA:   Abdominal pain EXAM: CHEST - 2 VIEW COMPARISON:  11/10/2020 FINDINGS: Stable cardiac silhouette. New opacity at the RIGHT lung base. LEFT lung clear. No pneumothorax. No acute osseous abnormality. IMPRESSION: RIGHT l

## 2021-04-23 NOTE — Assessment & Plan Note (Signed)
-  Continue nightly CPAP °

## 2021-04-23 NOTE — Progress Notes (Signed)
2130 Responded to patient raising his voice to staff members.  Pt is upset that there was a delay in getting his GI prep and did not want to start the first dose at this time.  The patient said that he has repeatedly asked for this medication and he is not going to take it now because he will be up all night.   ? ?I explained that I we dont have control over medication delivery and that the medicine is scheduled for the initial dose at 1900 and a second dose for 2300.  I tried to explain that he would most likely be up for a portion of the night anyway due to the second dose.   ? ?He stated that he didn't care and wanted to speak with the doctor.  I did inform that he would most likely be delaying his care if we didn't proceed tonight. He stated, "I understand what the ramifications are. You dont need to  say anymore. Everything you have said up to this point is useless. "  ? ?MD Hal Hope paged per the pts request to the bedside.  ? ?The patient stated that he was expecting his pain medication at 1900 to be given with the first dose of GI prep. Pt declined to have it at the moment because he wanted to be "coherent" to speak with the MD.  ? ?Pt stated he had no further needs at this time  ?

## 2021-04-23 NOTE — Progress Notes (Signed)
?  Transition of Care (TOC) Screening Note ? ? ?Patient Details  ?Name: Benjamin Fox ?Date of Birth: 1955/06/22 ? ? ?Transition of Care (TOC) CM/SW Contact:    ?Cyndi Bender, RN ?Phone Number: ?04/23/2021, 8:23 AM ? ? ? ?Transition of Care Department [facility]) has reviewed patient and no TOC needs have been identified at this time. We will continue to monitor patient advancement through interdisciplinary progression rounds. If new patient transition needs arise, please place a TOC consult. ? ? ?

## 2021-04-24 ENCOUNTER — Encounter (HOSPITAL_COMMUNITY)
Admission: AD | Disposition: A | Discharge status: Home / Self Care | Payer: Self-pay | Source: Other Acute Inpatient Hospital | Attending: Internal Medicine

## 2021-04-24 ENCOUNTER — Inpatient Hospital Stay (HOSPITAL_COMMUNITY): Payer: BC Managed Care – PPO

## 2021-04-24 ENCOUNTER — Encounter (HOSPITAL_COMMUNITY): Payer: Self-pay | Admitting: Gastroenterology

## 2021-04-24 DIAGNOSIS — I2510 Atherosclerotic heart disease of native coronary artery without angina pectoris: Secondary | ICD-10-CM | POA: Diagnosis not present

## 2021-04-24 DIAGNOSIS — J90 Pleural effusion, not elsewhere classified: Secondary | ICD-10-CM | POA: Diagnosis not present

## 2021-04-24 DIAGNOSIS — K922 Gastrointestinal hemorrhage, unspecified: Secondary | ICD-10-CM

## 2021-04-24 DIAGNOSIS — G4733 Obstructive sleep apnea (adult) (pediatric): Secondary | ICD-10-CM | POA: Diagnosis not present

## 2021-04-24 DIAGNOSIS — Z9989 Dependence on other enabling machines and devices: Secondary | ICD-10-CM

## 2021-04-24 DIAGNOSIS — R9389 Abnormal findings on diagnostic imaging of other specified body structures: Secondary | ICD-10-CM | POA: Diagnosis not present

## 2021-04-24 LAB — CBC WITH DIFFERENTIAL/PLATELET
Abs Immature Granulocytes: 0.12 10*3/uL — ABNORMAL HIGH (ref 0.00–0.07)
Basophils Absolute: 0 10*3/uL (ref 0.0–0.1)
Basophils Relative: 0 %
Eosinophils Absolute: 0 10*3/uL (ref 0.0–0.5)
Eosinophils Relative: 0 %
HCT: 27 % — ABNORMAL LOW (ref 39.0–52.0)
Hemoglobin: 8.6 g/dL — ABNORMAL LOW (ref 13.0–17.0)
Immature Granulocytes: 1 %
Lymphocytes Relative: 11 %
Lymphs Abs: 1 10*3/uL (ref 0.7–4.0)
MCH: 29.5 pg (ref 26.0–34.0)
MCHC: 31.9 g/dL (ref 30.0–36.0)
MCV: 92.5 fL (ref 80.0–100.0)
Monocytes Absolute: 0.5 10*3/uL (ref 0.1–1.0)
Monocytes Relative: 6 %
Neutro Abs: 6.8 10*3/uL (ref 1.7–7.7)
Neutrophils Relative %: 82 %
Platelets: 249 10*3/uL (ref 150–400)
RBC: 2.92 MIL/uL — ABNORMAL LOW (ref 4.22–5.81)
RDW: 16.6 % — ABNORMAL HIGH (ref 11.5–15.5)
WBC: 8.5 10*3/uL (ref 4.0–10.5)
nRBC: 0 % (ref 0.0–0.2)

## 2021-04-24 LAB — COMPREHENSIVE METABOLIC PANEL
ALT: 12 U/L (ref 0–44)
AST: 18 U/L (ref 15–41)
Albumin: 2 g/dL — ABNORMAL LOW (ref 3.5–5.0)
Alkaline Phosphatase: 127 U/L — ABNORMAL HIGH (ref 38–126)
Anion gap: 11 (ref 5–15)
BUN: <5 mg/dL — ABNORMAL LOW (ref 8–23)
CO2: 28 mmol/L (ref 22–32)
Calcium: 7.7 mg/dL — ABNORMAL LOW (ref 8.9–10.3)
Chloride: 99 mmol/L (ref 98–111)
Creatinine, Ser: 0.62 mg/dL (ref 0.61–1.24)
GFR, Estimated: >60 mL/min (ref >60)
Glucose, Bld: 92 mg/dL (ref 70–99)
Potassium: 3.2 mmol/L — ABNORMAL LOW (ref 3.5–5.1)
Sodium: 138 mmol/L (ref 135–145)
Total Bilirubin: 1 mg/dL (ref 0.3–1.2)
Total Protein: 5.1 g/dL — ABNORMAL LOW (ref 6.5–8.1)

## 2021-04-24 LAB — GLUCOSE, CAPILLARY
Glucose-Capillary: 104 mg/dL — ABNORMAL HIGH (ref 70–99)
Glucose-Capillary: 102 mg/dL — ABNORMAL HIGH (ref 70–99)
Glucose-Capillary: 95 mg/dL (ref 70–99)
Glucose-Capillary: 97 mg/dL (ref 70–99)
Glucose-Capillary: 104 mg/dL — ABNORMAL HIGH (ref 70–99)
Glucose-Capillary: 93 mg/dL (ref 70–99)
Glucose-Capillary: 93 mg/dL (ref 70–99)
Glucose-Capillary: 86 mg/dL (ref 70–99)
Glucose-Capillary: 81 mg/dL (ref 70–99)

## 2021-04-24 LAB — BRAIN NATRIURETIC PEPTIDE: B Natriuretic Peptide: 237.6 pg/mL — ABNORMAL HIGH (ref 0.0–100.0)

## 2021-04-24 LAB — MAGNESIUM: Magnesium: 1.4 mg/dL — ABNORMAL LOW (ref 1.7–2.4)

## 2021-04-24 SURGERY — CANCELLED PROCEDURE

## 2021-04-24 MED ORDER — DEXTROSE-NACL 5-0.9 % IV SOLN: INTRAVENOUS | Status: DC

## 2021-04-24 MED ORDER — MAGNESIUM SULFATE 4 GM/100ML IV SOLN
4.0000 g | Freq: Once | INTRAVENOUS | Status: AC
  Administered 2021-04-24: 4 g via INTRAVENOUS
  Filled 2021-04-24: qty 100

## 2021-04-24 MED ORDER — IOHEXOL 300 MG/ML  SOLN
100.0000 mL | Freq: Once | INTRAMUSCULAR | Status: AC | PRN
  Administered 2021-04-24: 100 mL via INTRAVENOUS

## 2021-04-24 MED ORDER — PEG-KCL-NACL-NASULF-NA ASC-C 100 G PO SOLR
0.5000 | Freq: Once | ORAL | Status: AC
  Administered 2021-04-24: 100 g via ORAL
  Filled 2021-04-24: qty 1

## 2021-04-24 MED ORDER — PEG-KCL-NACL-NASULF-NA ASC-C 100 G PO SOLR
1.0000 | Freq: Once | ORAL | Status: DC
Start: 2021-04-24 — End: 2021-04-24

## 2021-04-24 MED ORDER — SODIUM CHLORIDE (PF) 0.9 % IJ SOLN
INTRAMUSCULAR | Status: AC
  Administered 2021-04-24: 5 mL
  Filled 2021-04-24: qty 10

## 2021-04-24 MED ORDER — POTASSIUM CHLORIDE CRYS ER 20 MEQ PO TBCR
40.0000 meq | EXTENDED_RELEASE_TABLET | Freq: Once | ORAL | Status: AC
  Administered 2021-04-24: 40 meq via ORAL
  Filled 2021-04-24: qty 2

## 2021-04-24 SURGICAL SUPPLY — 21 items

## 2021-04-24 NOTE — H&P (View-Only) (Signed)
Patient ID: Benjamin Fox, male   DOB: 1955-05-03, 66 y.o.   MRN: [id number] ? ? ? Progress Note ? ? Subjective  ? Day # 2 ? CC; GI bleeding, cirrhosis, recurrent pancreatitis ,right upper quadrant fluid collection ? ?Patient did not complete bowel prep last evening-says prep was not brought to him for several hours after it was supposed to start. ?Has been stable overnight, says he had 2 very small volume bowel movements last evening, none since ?Continues to complain of right-sided abdominal pain which she says has "ramped up". ?Says he is tired and "hangry" from not eating for several days ? ?No significant bleeding from the right upper quadrant fluid collection drain ? ? ?Labs-WBC 8.5/hemoglobin 8.6/hematocrit 27.0 ?Potassium 3.2/creatinine 0.6 ?BNP 237 ? ?EGD yesterday-tortuous esophagus, no gross mucosal lesions, no varices, mucosal changes and ulceration noted on contralateral wall to the ampulla, biopsied, no evidence of hemobilia ?Sigmoidoscopy-old blood and some red blood noted in the descending colon, no active bleeding, internal hemorrhoids ? ? Objective  ? ?Vital signs in last 24 hours: ?Temp:  [97.4 ?F (36.3 ?C)-98 ?F (36.7 ?C)] 97.7 ?F (36.5 ?C) (03/31 4827) ?Pulse Rate:  [68-89] 87 (03/31 0806) ?Resp:  [14-22] 21 (03/31 0806) ?BP: (112-154)/(64-95) 150/76 (03/31 0806) ?SpO2:  [90 %-98 %] 93 % (03/31 0806) ?Weight:  [113.4 kg] 113.4 kg (03/30 1356) ?Last BM Date : 04/23/21 ?General: Older white male in NAD ?Heart:  Regular rate and rhythm; no murmurs ?Lungs: Respirations even and unlabored, lungs CTA bilaterally ?Abdomen:  Soft, obese, and he is tender in the right upper quadrant and right mid quadrant below with the drain ,normal bowel sounds.  No ecchymoses ?Extremities:  Without edema. ?Neurologic:  Alert and oriented,  grossly normal neurologically. ?Psych:  Cooperative. Normal mood and affect. ? ?Intake/Output from previous day: ?03/30 0701 - 03/31 0700 ?In: 1975.8 [I.V.:1688.9; IV  Piggyback:286.9] ?Out: 310 [Urine:200; Drains:110] ?Intake/Output this shift: ?Total I/O ?In: -  ?Out: 275 [Urine:275] ? ?Lab Results: ?Recent Labs  ?  04/23/21 ?1018 04/23/21 ?1707 04/24/21 ?0118  ?WBC 6.6 7.3 8.5  ?HGB 9.2* 9.3* 8.6*  ?HCT 27.9* 28.7* 27.0*  ?PLT 279 249 249  ? ?BMET ?Recent Labs  ?  04/23/21 ?0305 04/24/21 ?0118  ?NA 141 138  ?K 3.2* 3.2*  ?CL 102 99  ?CO2 28 28  ?GLUCOSE 76 92  ?BUN 6* <5*  ?CREATININE 0.64 0.62  ?CALCIUM 7.9* 7.7*  ? ?LFT ?Recent Labs  ?  04/24/21 ?0118  ?PROT 5.1*  ?ALBUMIN 2.0*  ?AST 18  ?ALT 12  ?ALKPHOS 127*  ?BILITOT 1.0  ? ?PT/INR ?Recent Labs  ?  04/23/21 ?0305  ?LABPROT 15.3*  ?INR 1.2  ? ? ?Studies/Results: ?DG Chest 2 View ? ?Result Date: 04/23/2021 ?CLINICAL DATA:  Abdominal pain EXAM: CHEST - 2 VIEW COMPARISON:  11/10/2020 FINDINGS: Stable cardiac silhouette. New opacity at the RIGHT lung base. LEFT lung clear. No pneumothorax. No acute osseous abnormality. IMPRESSION: RIGHT lower lobe atelectasis and effusion. Cannot exclude RIGHT lower pneumonia. If clinical pneumonia, Followup PA and lateral chest X-ray is recommended in 3-4 weeks following trial of antibiotic therapy to ensure resolution and exclude underlying malignancy. Electronically Signed   By: Suzy Bouchard M.D.   On: 04/23/2021 08:12   ? ? ? ? Assessment / Plan:   ? ?#16 66 year old white male with complicated GI history, chronic mesenteric ischemia status post SMA and celiac artery stents-on chronic Plavix ?#2 History of recurrent idiopathic pancreatitis, likely secondary to development of chronic  pancreatitis-history of prior pancreatic duct leak requiring temporary pancreatic duct stenting and pancreatic duct sphincterotomy, history of pancreatic pseudoaneurysm ? ?#3 hx  of appendiceal abscess managed medically ?#4 cirrhosis with splenomegaly and ascites ? ?#5 hospitalized currently out 10 days ago elsewhere with recurrent nausea vomiting and abdominal pain felt to be secondary to acute on chronic  pancreatitis.  Initial CT imaging did not show any severe pancreatitis nor did they call a fluid collection however follow-up imaging 6 days later with large perihepatic/subcapsular fluid collection, and underwent subsequent IR aspiration/drainage ?Patient had a large amount of bright red blood output from the drain. ?Decision was made to transfer here - question pseudoaneurysm vs pancreatic fluid collection. ? ?Bleeding has significantly decreased since transfer ?IR did not feel that CTA indicated at this time less has recurrent bleeding ? ?#6 hematemesis, large-volume on 04/21/2021 prior to transfer here-EGD at [facility] no active bleeding and no varices, focal duodenitis ?Repeat EGD yesterday and sigmoidoscopy, due to drop in hemoglobin and bloody bowel movement ?No active bleeding in the upper GI tract there was a small ulceration contralateral to the ampulla, but no bleeding.  Blood in the descending colon, no source identified ? ?#7 chronic anticoagulation and antiplatelet therapy-usually on Xarelto and Plavix ?With have been held, today is day #4 off Plavix ? ?#8 artery disease status post RCA stent ?#9 adult onset diabetes mellitus ?#10 anemia acute on chronic secondary to GI blood loss ? ?Plan; full liquid diet ?Have ordered CT of the abdomen pelvis with IV and oral contrast for today, reassess right upper quadrant/subcapsular fluid collection, right colon ? ?We will plan for colonoscopy tomorrow with Dr. Rush Landmark start prep later this afternoon. ?Continue to hold Xarelto and Plavix ?Continue to trend hemoglobin ?Further recommendations pending results of CT ? ? ? ?Principal Problem: ?  Abdominal pain ?Active Problems: ?  Coronary artery disease ?  OSA on CPAP ?  Type 2 diabetes mellitus ([place]) ?  HTN (hypertension) ?  Pleural effusion ?  Abnormal finding on CT scan ?  Abnormal finding ?  Rectal bleeding ? ? ? ? LOS: 2 days  ? ?Benjamin Mareno  PA-C3/31/2023, 9:01 AM ?  ?

## 2021-04-24 NOTE — Anesthesia Preprocedure Evaluation (Addendum)
Anesthesia Evaluation  ?Patient identified by MRN, date of birth, ID band ?Patient awake ? ? ? ?Reviewed: ?Allergy & Precautions, NPO status , Patient's Chart, lab work & pertinent test results, reviewed documented beta blocker date and time  ? ?Airway ?Mallampati: I ? ? ? ? ? ? Dental ?no notable dental hx. ? ?  ?Pulmonary ?sleep apnea and Continuous Positive Airway Pressure Ventilation , former smoker,  ?  ?Pulmonary exam normal ? ? ? ? ? ? ? Cardiovascular ?hypertension, Pt. on medications and Pt. on home beta blockers ?+ CAD, + Cardiac Stents and + Peripheral Vascular Disease  ?Normal cardiovascular exam+ dysrhythmias Atrial Fibrillation  ? ? ?  ?Neuro/Psych ?Anxiety negative neurological ROS ?   ? GI/Hepatic ?Neg liver ROS,   ?Endo/Other  ?diabetes, Type 2, Oral Hypoglycemic Agents ? Renal/GU ?negative Renal ROS  ? ?  ?Musculoskeletal ? ? Abdominal ?(+) + obese,   ?Peds ? Hematology ? ?(+) Blood dyscrasia, anemia ,   ?Anesthesia Other Findings ?GI bleed ? Reproductive/Obstetrics ? ?  ? ? ? ? ? ? ? ? ? ? ? ? ? ?  ?  ? ? ? ? ? ? ?Anesthesia Physical ? ?Anesthesia Plan ? ?ASA: 3 ? ?Anesthesia Plan: MAC  ? ?Post-op Pain Management:   ? ?Induction: Intravenous ? ?PONV Risk Score and Plan: 1 and Propofol infusion ? ?Airway Management Planned: Simple Face Mask ? ?Additional Equipment:  ? ?Intra-op Plan:  ? ?Post-operative Plan:  ? ?Informed Consent: I have reviewed the patients History and Physical, chart, labs and discussed the procedure including the risks, benefits and alternatives for the proposed anesthesia with the patient or authorized representative who has indicated his/her understanding and acceptance.  ? ? ? ? ? ?Plan Discussed with: CRNA ? ?Anesthesia Plan Comments:   ? ? ? ? ? ?Anesthesia Quick Evaluation ? ?

## 2021-04-24 NOTE — Progress Notes (Signed)
Patient ID: Benjamin Fox, male   DOB: 03/29/1955, 66 y.o.   MRN: [id number] ? ? ? Progress Note ? ? Subjective  ? Day # 2 ? CC; GI bleeding, cirrhosis, recurrent pancreatitis ,right upper quadrant fluid collection ? ?Patient did not complete bowel prep last evening-says prep was not brought to him for several hours after it was supposed to start. ?Has been stable overnight, says he had 2 very small volume bowel movements last evening, none since ?Continues to complain of right-sided abdominal pain which she says has "ramped up". ?Says he is tired and "hangry" from not eating for several days ? ?No significant bleeding from the right upper quadrant fluid collection drain ? ? ?Labs-WBC 8.5/hemoglobin 8.6/hematocrit 27.0 ?Potassium 3.2/creatinine 0.6 ?BNP 237 ? ?EGD yesterday-tortuous esophagus, no gross mucosal lesions, no varices, mucosal changes and ulceration noted on contralateral wall to the ampulla, biopsied, no evidence of hemobilia ?Sigmoidoscopy-old blood and some red blood noted in the descending colon, no active bleeding, internal hemorrhoids ? ? Objective  ? ?Vital signs in last 24 hours: ?Temp:  [97.4 ?F (36.3 ?C)-98 ?F (36.7 ?C)] 97.7 ?F (36.5 ?C) (03/31 2229) ?Pulse Rate:  [68-89] 87 (03/31 0806) ?Resp:  [14-22] 21 (03/31 0806) ?BP: (112-154)/(64-95) 150/76 (03/31 0806) ?SpO2:  [90 %-98 %] 93 % (03/31 0806) ?Weight:  [113.4 kg] 113.4 kg (03/30 1356) ?Last BM Date : 04/23/21 ?General: Older white male in NAD ?Heart:  Regular rate and rhythm; no murmurs ?Lungs: Respirations even and unlabored, lungs CTA bilaterally ?Abdomen:  Soft, obese, and he is tender in the right upper quadrant and right mid quadrant below with the drain ,normal bowel sounds.  No ecchymoses ?Extremities:  Without edema. ?Neurologic:  Alert and oriented,  grossly normal neurologically. ?Psych:  Cooperative. Normal mood and affect. ? ?Intake/Output from previous day: ?03/30 0701 - 03/31 0700 ?In: 1975.8 [I.V.:1688.9; IV  Piggyback:286.9] ?Out: 310 [Urine:200; Drains:110] ?Intake/Output this shift: ?Total I/O ?In: -  ?Out: 275 [Urine:275] ? ?Lab Results: ?Recent Labs  ?  04/23/21 ?1018 04/23/21 ?1707 04/24/21 ?0118  ?WBC 6.6 7.3 8.5  ?HGB 9.2* 9.3* 8.6*  ?HCT 27.9* 28.7* 27.0*  ?PLT 279 249 249  ? ?BMET ?Recent Labs  ?  04/23/21 ?0305 04/24/21 ?0118  ?NA 141 138  ?K 3.2* 3.2*  ?CL 102 99  ?CO2 28 28  ?GLUCOSE 76 92  ?BUN 6* <5*  ?CREATININE 0.64 0.62  ?CALCIUM 7.9* 7.7*  ? ?LFT ?Recent Labs  ?  04/24/21 ?0118  ?PROT 5.1*  ?ALBUMIN 2.0*  ?AST 18  ?ALT 12  ?ALKPHOS 127*  ?BILITOT 1.0  ? ?PT/INR ?Recent Labs  ?  04/23/21 ?0305  ?LABPROT 15.3*  ?INR 1.2  ? ? ?Studies/Results: ?DG Chest 2 View ? ?Result Date: 04/23/2021 ?CLINICAL DATA:  Abdominal pain EXAM: CHEST - 2 VIEW COMPARISON:  11/10/2020 FINDINGS: Stable cardiac silhouette. New opacity at the RIGHT lung base. LEFT lung clear. No pneumothorax. No acute osseous abnormality. IMPRESSION: RIGHT lower lobe atelectasis and effusion. Cannot exclude RIGHT lower pneumonia. If clinical pneumonia, Followup PA and lateral chest X-ray is recommended in 3-4 weeks following trial of antibiotic therapy to ensure resolution and exclude underlying malignancy. Electronically Signed   By: Suzy Bouchard M.D.   On: 04/23/2021 08:12   ? ? ? ? Assessment / Plan:   ? ?#13 66 year old white male with complicated GI history, chronic mesenteric ischemia status post SMA and celiac artery stents-on chronic Plavix ?#2 History of recurrent idiopathic pancreatitis, likely secondary to development of chronic  pancreatitis-history of prior pancreatic duct leak requiring temporary pancreatic duct stenting and pancreatic duct sphincterotomy, history of pancreatic pseudoaneurysm ? ?#3 hx  of appendiceal abscess managed medically ?#4 cirrhosis with splenomegaly and ascites ? ?#5 hospitalized currently out 10 days ago elsewhere with recurrent nausea vomiting and abdominal pain felt to be secondary to acute on chronic  pancreatitis.  Initial CT imaging did not show any severe pancreatitis nor did they call a fluid collection however follow-up imaging 6 days later with large perihepatic/subcapsular fluid collection, and underwent subsequent IR aspiration/drainage ?Patient had a large amount of bright red blood output from the drain. ?Decision was made to transfer here - question pseudoaneurysm vs pancreatic fluid collection. ? ?Bleeding has significantly decreased since transfer ?IR did not feel that CTA indicated at this time less has recurrent bleeding ? ?#6 hematemesis, large-volume on 04/21/2021 prior to transfer here-EGD at [facility] no active bleeding and no varices, focal duodenitis ?Repeat EGD yesterday and sigmoidoscopy, due to drop in hemoglobin and bloody bowel movement ?No active bleeding in the upper GI tract there was a small ulceration contralateral to the ampulla, but no bleeding.  Blood in the descending colon, no source identified ? ?#7 chronic anticoagulation and antiplatelet therapy-usually on Xarelto and Plavix ?With have been held, today is day #4 off Plavix ? ?#8 artery disease status post RCA stent ?#9 adult onset diabetes mellitus ?#10 anemia acute on chronic secondary to GI blood loss ? ?Plan; full liquid diet ?Have ordered CT of the abdomen pelvis with IV and oral contrast for today, reassess right upper quadrant/subcapsular fluid collection, right colon ? ?We will plan for colonoscopy tomorrow with Dr. Rush Landmark start prep later this afternoon. ?Continue to hold Xarelto and Plavix ?Continue to trend hemoglobin ?Further recommendations pending results of CT ? ? ? ?Principal Problem: ?  Abdominal pain ?Active Problems: ?  Coronary artery disease ?  OSA on CPAP ?  Type 2 diabetes mellitus ([place]) ?  HTN (hypertension) ?  Pleural effusion ?  Abnormal finding on CT scan ?  Abnormal finding ?  Rectal bleeding ? ? ? ? LOS: 2 days  ? ?Birdell Frasier  PA-C3/31/2023, 9:01 AM ?  ?

## 2021-04-24 NOTE — Progress Notes (Signed)
? ? ?Referring Physician(s): ?DR Mansouraty ? ? ?Supervising Physician: Aletta Edouard ? ?Patient Status:  [facility] - In-pt ? ?Chief Complaint: ? ?Perihepatic abscess drain placed at [facility] 3/29 ? ?Subjective: ? ?GI bleed/maroon stools---admitted 3/29 to [facility] ?Post perihepatic abscess drain at [facility] earlier that day ? ?Flex sig with Dr Rush Landmark 3/29:   ?Preparation of the colon was poor even with lavage. Old clot and red blood tinged fluid ?noted. Active GI hemorrhage however was not appreciated from the left colon. ?- Hemorrhoids found on digital rectal exam. ?- Blood and stool in the descending colon, at the splenic flexure and in the transverse colon. ?- Non-bleeding non-thrombosed external and internal hemorrhoids. ?- Cannot rule out a lesion in the right colon or the small bowel as source of GI blood loss. ? ?I have seen pt ?Perihepatic abscess-Drain flushes well ?Pt is in NAD ? ? ?Allergies: ?Patient has no known allergies. ? ?Medications: ?Prior to Admission medications   ?Medication Sig Start Date End Date Taking? Authorizing Provider  ?acetaminophen (TYLENOL) 500 MG tablet Take 2 tablets (1,000 mg total) by mouth every 6 (six) hours as needed for mild pain (or Fever >/= 101). 09/12/20  Yes Saverio Danker, PA-C  ?atorvastatin (LIPITOR) 10 MG tablet Take 1 tablet (10 mg total) by mouth daily. 01/21/21  Yes Barb Merino, MD  ?carvedilol (COREG) 25 MG tablet Take 25 mg by mouth 2 (two) times daily. 10/31/20  Yes [provider]  ?clopidogrel (PLAVIX) 75 MG tablet Take 1 tablet (75 mg total) by mouth daily. ?Patient taking differently: Take 75 mg by mouth every morning. 09/17/20  Yes Theresa Duty, NP  ?Coenzyme Q10 100 MG capsule Take 100 mg by mouth every morning.   Yes [provider]  ?Cyanocobalamin 5000 MCG SUBL Place 5,000 mcg under the tongue every morning. 10/28/15  Yes [provider]  ?diltiazem (CARDIZEM CD) 240 MG 24 hr capsule Take 240 mg by mouth every morning.  07/20/20  Yes [provider]  ?fenofibrate (TRICOR) 145 MG tablet Take 145 mg by mouth every morning. 11/06/20  Yes [provider]  ?furosemide (LASIX) 20 MG tablet Take 20 mg by mouth daily. 03/30/21  Yes [provider]  ?lisinopril (ZESTRIL) 30 MG tablet Take 30 mg by mouth 2 (two) times daily. 07/16/20  Yes [provider]  ?metFORMIN (GLUCOPHAGE) 500 MG tablet Take 500 mg by mouth 2 (two) times daily. 01/12/21  Yes [provider]  ?oxyCODONE (OXY IR/ROXICODONE) 5 MG immediate release tablet Take 1 tablet (5 mg total) by mouth every 4 (four) hours as needed for moderate pain. 01/21/21  Yes Barb Merino, MD  ?pantoprazole (PROTONIX) 40 MG tablet Take 40 mg by mouth daily. 04/12/20  Yes [provider]  ?rivaroxaban (XARELTO) 20 MG TABS tablet Take 20 mg by mouth every evening.   Yes [provider]  ?sertraline (ZOLOFT) 100 MG tablet Take 100 mg by mouth every evening. 08/05/20  Yes [provider]  ? ? ? ?Vital Signs: ?BP (!) 147/93 (BP Location: Left Arm)   Pulse 77   Temp 98 ?F (36.7 ?C) (Oral)   Resp 20   Ht '6\' 1"'$  (1.854 m)   Wt 250 lb (113.4 kg)   SpO2 91%   BMI 32.98 kg/m?  ? ?Physical Exam ?Skin: ?   General: Skin is warm.  ?   Comments: Site is clean and dry ?NT no bleeding ?Drain flushes well ?OP is blood tinged fluid ?Scant fluid in bag this  am ?110 cc OP recorded yesterday  ?Neurological:  ?   Mental Status: He is alert.  ? ? ?Imaging: ?DG Chest 2 View ? ?Result Date: 04/23/2021 ?CLINICAL DATA:  Abdominal pain EXAM: CHEST - 2 VIEW COMPARISON:  11/10/2020 FINDINGS: Stable cardiac silhouette. New opacity at the RIGHT lung base. LEFT lung clear. No pneumothorax. No acute osseous abnormality. IMPRESSION: RIGHT lower lobe atelectasis and effusion. Cannot exclude RIGHT lower pneumonia. If clinical pneumonia, Followup PA and lateral chest X-ray is recommended in 3-4 weeks following trial of antibiotic therapy to ensure resolution and  exclude underlying malignancy. Electronically Signed   By: Suzy Bouchard M.D.   On: 04/23/2021 08:12   ? ?Labs: ? ?CBC: ?Recent Labs  ?  04/23/21 ?0305 04/23/21 ?1018 04/23/21 ?1707 04/24/21 ?0118  ?WBC 6.7 6.6 7.3 8.5  ?HGB 8.6* 9.2* 9.3* 8.6*  ?HCT 25.9* 27.9* 28.7* 27.0*  ?PLT 260 279 249 249  ? ? ?COAGS: ?Recent Labs  ?  09/15/20 ?0630 04/23/21 ?0305  ?INR 1.0 1.2  ? ? ?BMP: ?Recent Labs  ?  01/19/21 ?0911 01/20/21 ?0416 04/23/21 ?0305 04/24/21 ?0118  ?NA 138 140 141 138  ?K 3.4* 3.6 3.2* 3.2*  ?CL 106 108 102 99  ?CO2 '25 25 28 28  '$ ?GLUCOSE 159* 117* 76 92  ?BUN 27* 25* 6* <5*  ?CALCIUM 8.1* 8.2* 7.9* 7.7*  ?CREATININE 0.97 0.99 0.64 0.62  ?GFRNONAA >60 >60 >60 >60  ? ? ?LIVER FUNCTION TESTS: ?Recent Labs  ?  01/17/21 ?8182 01/18/21 ?0132 04/23/21 ?9937 04/24/21 ?0118  ?BILITOT 1.0 1.0 1.1 1.0  ?AST 14* 11* 19 18  ?ALT '9 7 13 12  '$ ?ALKPHOS 57 50 140* 127*  ?PROT 7.0 6.0* 5.1* 5.1*  ?ALBUMIN 3.4* 2.8* 1.9* 2.0*  ? ? ?Assessment and Plan: ? ?Perihepatic abscess drain placed at [facility] 04/22/21 ?IR will follow while pt is IP at [facility] ?Plan per [facility] ? ?Electronically Signed: ?Lavonia Drafts, PA-C ?04/24/2021, 7:02 AM ? ? ?I spent a total of 15 Minutes at the the patient's bedside AND on the patient's hospital floor or unit, greater than 50% of which was counseling/coordinating care for perihepatic absc drain ? ? ? ?  ?

## 2021-04-24 NOTE — Progress Notes (Addendum)
?                                  PROGRESS NOTE                                             ?                                                                                                                     ?                                         ? ? Patient Demographics:  ? ? Benjamin Fox, is a 66 y.o. male, DOB - 09-May-1955, DXA:[id number] ? ?Outpatient Primary MD for the patient is Martinique, Julie M, NP    LOS - 2  Admit date - 04/22/2021   ? ?Transfer from [facility] for right upper quadrant possible liver laceration bleed.    ? ?Brief Narrative (HPI from H&P)   -  66 y.o. male with history of  chronic A-fib on Xarelto, chronic mesenteric ischemia status post celiac and SMA stenting and subsequent thrombectomy for occluded stents 08/2020, CAD status post RCA stenting, type 2 diabetes, hypertension, hyperlipidemia, depression, anxiety, OSA on CPAP, status post prior gastric band (now removed), recurrent idiopathic pancreatitis (complicated by previous leak requiring PD stenting and PD sphincterotomy), previous pancreatic pseudoaneurysm, appendiceal abscess,  just recently known to Dr. Rush Fox, after initial office evaluation on 03/26/2021 for recurrent idiopathic pancreatitis.  Patient has a complicated GI history, with underlying history of chronic atrial fibrillation for which she had been on Xarelto, coronary artery disease status post RCA stents, history of chronic mesenteric ischemia status post celiac and mesenteric stents for which she has been on Plavix, sleep apnea, adult onset diabetes mellitus and hypertension. Had multiple admissions with pancreatitis, most recent in December 2022.  After recent office consultation plan was for MRI/MRCP, then possible ERCP. ? ?Patient unfortunately was rehospitalized on 04/14/2021 at [facility] with acute abdominal pain nausea and vomiting. Initial lipase was 433, CT of the abdomen pelvis on the day of admission  showed mild acute on chronic pancreatitis, evidence of cirrhosis intra-abdominal varices, splenomegaly ascites and a distended gallbladder. ? ?Repeat CT on 04/19/2021 showed persistent recurrent acute on chronic pancreatitis reactive changes in the duodenal sweep, and interval enlargement of a right hip perihepatic/subcapsular fluid collection measuring 24 x 11 cm, also loculated right pleural effusion and right lower lobe consolidation. ? ?On 04/21/2021 he had a large volume hematemesis and underwent EGD there which showed gastritis and focal duodenitis, no active bleeding and no esophageal or gastric varices. ? ?On the following day he underwent IR drainage of the perihepatic fluid collection  and immediately had about 1500 cc of bright red blood output from the drain.  He required 2 units of packed RBCs.  Decision was made to transfer here.  Hemoglobin was 8.5 after 2 units.  He has been covered by with Zosyn. ? ? ? Subjective:  ? ? Benjamin Fox today has, No headache, No chest pain, +ve RUQ abdominal pain - No Nausea, No new weakness tingling or numbness, no SOB. ? ? Assessment  & Plan :  ? ? ?Abdominal pain patient with history of recurrent idiopathic pancreatitis, bowel ischemia, pancreatic pseudoaneurysm, recent upper GI bleed at [facility] with EGD showing gastritis and focal duodenitis, CT scan at [facility] on 04/19/2021 showing right perihepatic fluid collection s/p IR drainage of fluid collection at [facility] on 04/20/2021 with bloody fluid coming out. ?  ?- Due to his recent upper GI bleed and now bloody drainage from his right upper quadrant perihepatic drain his Plavix and Xarelto are on hold, per IR Dr. Hinda Fox he discussed his case with patient's primary cardiologist discontinue Xarelto permanently, he has received 2 units of packed RBC at [facility] on 04/22/2021.  Continue to monitor H&H, IV PPI, transfuse as needed to keep hemoglobin around 7.5, IR and GI both on  board.  EGD done here on 04/23/2021 unremarkable, being prepped for colonoscopy, IR also on board for now right upper quadrant drain fluid is clearing up this could have been spontaneous bleeding which happened upon placement of right upper quadrant drain into subcapsular fluid collection.  Continue to monitor H&H.  For now continue Zosyn. ? ? ?Abnormal finding on EGD done at [facility].  EGD done at outside hospital showing indeterminate left pyriform sinus lesion, may represent eroded mucosa in the setting of dehydration though oropharyngeal malignancy not excluded.  PCP to ensure outpatient ENT follow-up. ? ?Right lower pleural effusion - likely sympathetic effusion from perihepatic fluid collection, no cough or pleuritic symptoms, likely has compressive atelectasis will continue to monitor for now on Zosyn which will be continued. ?  ?Coronary artery disease -Stable.  Not endorsing any anginal symptoms, aspirin on hold, resume beta-blocker and statin. ?  ?Paroxysmal A-fib [place] vas 2 score of greater than 3.  Half Home dose beta-blocker, calcium channel blocker and Xarelto once bleeding has resolved ? ?History of chronic mesenteric ischemia, SMA stenting and subsequent thrombectomy of occluded stents in 08/2020.  Aspirin and statin along with anticoagulation when able to ? ?Abnormal finding on CT scan - -CT done at outside hospital showing wedge-shaped region of diminished attenuation involving the left kidney, suspicious for pyelonephritis.  Infarction felt less likely, no dysuria for now covered with Zosyn for #1 above.  UA unremarkable.  Question if this is infarction. ?  ?HTN (hypertension) - switch to home dose beta-blocker, home dose calcium channel blocker along with as needed IV hydralazine. ?  ?Hypokalemia and hypomagnesemia.  Replaced.   ? ?OSA on CPAP -Continue nightly CPAP ? ?Type 2 diabetes mellitus (HCC)  - A1c 5.8 on 04/15/2021 on labs done at outside facility, gentle D5 drip and no insulin continue  to monitor. ? ?CBG (last 3)  ?Recent Labs  ?  04/24/21 ?2536 04/24/21 ?6440 04/24/21 ?1013  ?GLUCAP 95 97 104*  ? ?  ? ?   ? ?Condition - Extremely Guarded ? ?Family Communication  :  None present, patient wants to communicate his medical history himself to the family ? ?Code Status :  Full ? ?Consults  :  IR, GI ? ?PUD  Prophylaxis : PPI ? ? Procedures  :    ? ?EGD  04/13/21 -  ? ?Findings: ?     The examined esophagus was moderately tortuous. ?     No gross mucosal lesions were noted in the entire esophagus. ?     There is no endoscopic evidence of varices in the entire esophagus. ?     The Z-line was irregular and was found 44 cm from the incisors. ?     Patchy mildly erythematous mucosa without bleeding was found in the  ?     entire examined stomach. Biopsies were taken with a cold forceps for  ?     histology and Helicobacter pylori testing. ?     Localized, moderate mucosal changes characterized by granularity,  ?     scalloping, altered texture and a decreased vascular pattern were found  ?     in the gastric antrum extending/surrounding the prepyloric region of the  ?     stomach. Biopsies were taken with a cold forceps for histology to rule  ?     out dysplasia. ?     Localized severe mucosal changes characterized by congestion, erythema,  ?     friability (with contact bleeding), granularity and altered texture were  ?     found on the contralateral wall of D2 from the major papilla. Biopsies  ?     were taken with a cold forceps for histology. ?     There was evidence of a what appeared to be biliary and pancreatic  ?     sphincterotomies at the major papillary region. I monitored this area  ?     for a few minutes and found no evidence of hemobilia. ?     No other gross lesions were noted in the duodenal bulb, in the first  ?     portion of the duodenum and in the second portion of the duodenum. ?Impression:               - Tortuous esophagus but no gross mucosal lesions  ?                          in  esophagus. Z-line irregular, 44 cm from the  ?                          incisors. ?                          - Erythematous mucosa in the stomach. Biopsied. ?                          - Granular, scalloped, t

## 2021-04-25 ENCOUNTER — Inpatient Hospital Stay (HOSPITAL_COMMUNITY): Payer: BC Managed Care – PPO | Admitting: Anesthesiology

## 2021-04-25 ENCOUNTER — Encounter (HOSPITAL_COMMUNITY): Payer: Self-pay | Admitting: Internal Medicine

## 2021-04-25 ENCOUNTER — Encounter (HOSPITAL_COMMUNITY)
Admission: AD | Disposition: A | Discharge status: Home / Self Care | Payer: Self-pay | Source: Other Acute Inpatient Hospital | Attending: Internal Medicine

## 2021-04-25 DIAGNOSIS — R1084 Generalized abdominal pain: Secondary | ICD-10-CM

## 2021-04-25 DIAGNOSIS — D122 Benign neoplasm of ascending colon: Secondary | ICD-10-CM

## 2021-04-25 DIAGNOSIS — K921 Melena: Secondary | ICD-10-CM

## 2021-04-25 DIAGNOSIS — R9389 Abnormal findings on diagnostic imaging of other specified body structures: Secondary | ICD-10-CM | POA: Diagnosis not present

## 2021-04-25 DIAGNOSIS — K861 Other chronic pancreatitis: Secondary | ICD-10-CM

## 2021-04-25 DIAGNOSIS — K7689 Other specified diseases of liver: Secondary | ICD-10-CM | POA: Diagnosis not present

## 2021-04-25 HISTORY — PX: COLONOSCOPY WITH PROPOFOL: SHX5780

## 2021-04-25 LAB — COMPREHENSIVE METABOLIC PANEL
ALT: 12 U/L (ref 0–44)
AST: 18 U/L (ref 15–41)
Albumin: 2 g/dL — ABNORMAL LOW (ref 3.5–5.0)
Alkaline Phosphatase: 109 U/L (ref 38–126)
Anion gap: 9 (ref 5–15)
BUN: <5 mg/dL — ABNORMAL LOW (ref 8–23)
CO2: 26 mmol/L (ref 22–32)
Calcium: 7.8 mg/dL — ABNORMAL LOW (ref 8.9–10.3)
Chloride: 107 mmol/L (ref 98–111)
Creatinine, Ser: 0.57 mg/dL — ABNORMAL LOW (ref 0.61–1.24)
GFR, Estimated: >60 mL/min (ref >60)
Glucose, Bld: 102 mg/dL — ABNORMAL HIGH (ref 70–99)
Potassium: 3.2 mmol/L — ABNORMAL LOW (ref 3.5–5.1)
Sodium: 142 mmol/L (ref 135–145)
Total Bilirubin: 1 mg/dL (ref 0.3–1.2)
Total Protein: 5.3 g/dL — ABNORMAL LOW (ref 6.5–8.1)

## 2021-04-25 LAB — BRAIN NATRIURETIC PEPTIDE: B Natriuretic Peptide: 295.2 pg/mL — ABNORMAL HIGH (ref 0.0–100.0)

## 2021-04-25 LAB — CBC WITH DIFFERENTIAL/PLATELET
Abs Immature Granulocytes: 0.1 10*3/uL — ABNORMAL HIGH (ref 0.00–0.07)
Basophils Absolute: 0 10*3/uL (ref 0.0–0.1)
Basophils Relative: 0 %
Eosinophils Absolute: 0 10*3/uL (ref 0.0–0.5)
Eosinophils Relative: 0 %
HCT: 25.7 % — ABNORMAL LOW (ref 39.0–52.0)
Hemoglobin: 8.2 g/dL — ABNORMAL LOW (ref 13.0–17.0)
Immature Granulocytes: 1 %
Lymphocytes Relative: 11 %
Lymphs Abs: 0.9 10*3/uL (ref 0.7–4.0)
MCH: 29.4 pg (ref 26.0–34.0)
MCHC: 31.9 g/dL (ref 30.0–36.0)
MCV: 92.1 fL (ref 80.0–100.0)
Monocytes Absolute: 0.5 10*3/uL (ref 0.1–1.0)
Monocytes Relative: 7 %
Neutro Abs: 6.2 10*3/uL (ref 1.7–7.7)
Neutrophils Relative %: 81 %
Platelets: 221 10*3/uL (ref 150–400)
RBC: 2.79 MIL/uL — ABNORMAL LOW (ref 4.22–5.81)
RDW: 16.3 % — ABNORMAL HIGH (ref 11.5–15.5)
WBC: 7.7 10*3/uL (ref 4.0–10.5)
nRBC: 0 % (ref 0.0–0.2)

## 2021-04-25 LAB — GLUCOSE, CAPILLARY
Glucose-Capillary: 113 mg/dL — ABNORMAL HIGH (ref 70–99)
Glucose-Capillary: 118 mg/dL — ABNORMAL HIGH (ref 70–99)
Glucose-Capillary: 136 mg/dL — ABNORMAL HIGH (ref 70–99)
Glucose-Capillary: 85 mg/dL (ref 70–99)
Glucose-Capillary: 92 mg/dL (ref 70–99)
Glucose-Capillary: 93 mg/dL (ref 70–99)
Glucose-Capillary: 95 mg/dL (ref 70–99)
Glucose-Capillary: 98 mg/dL (ref 70–99)

## 2021-04-25 LAB — PROCALCITONIN: Procalcitonin: <0.1 ng/mL

## 2021-04-25 LAB — MAGNESIUM: Magnesium: 1.6 mg/dL — ABNORMAL LOW (ref 1.7–2.4)

## 2021-04-25 SURGERY — COLONOSCOPY WITH PROPOFOL
Anesthesia: Monitor Anesthesia Care

## 2021-04-25 MED ORDER — PROPOFOL 500 MG/50ML IV EMUL
INTRAVENOUS | Status: DC | PRN
  Administered 2021-04-25: 75 ug/kg/min via INTRAVENOUS

## 2021-04-25 MED ORDER — LIDOCAINE 2% (20 MG/ML) 5 ML SYRINGE
INTRAMUSCULAR | Status: DC | PRN
  Administered 2021-04-25: 60 mg via INTRAVENOUS

## 2021-04-25 MED ORDER — BOOST / RESOURCE BREEZE PO LIQD CUSTOM
1.0000 | Freq: Three times a day (TID) | ORAL | Status: DC
  Administered 2021-04-25 – 2021-04-28 (×4): 1 via ORAL

## 2021-04-25 MED ORDER — MAGNESIUM SULFATE 4 GM/100ML IV SOLN
4.0000 g | Freq: Once | INTRAVENOUS | Status: AC
  Administered 2021-04-25: 4 g via INTRAVENOUS
  Filled 2021-04-25: qty 100

## 2021-04-25 MED ORDER — LACTATED RINGERS IV SOLN
INTRAVENOUS | Status: DC
Start: 2021-04-25 — End: 2021-04-28

## 2021-04-25 MED ORDER — POTASSIUM CHLORIDE CRYS ER 20 MEQ PO TBCR
40.0000 meq | EXTENDED_RELEASE_TABLET | Freq: Two times a day (BID) | ORAL | Status: AC
  Administered 2021-04-25 (×2): 40 meq via ORAL
  Filled 2021-04-25 (×2): qty 2

## 2021-04-25 SURGICAL SUPPLY — 22 items

## 2021-04-25 NOTE — Progress Notes (Signed)
?                                  PROGRESS NOTE                                             ?                                                                                                                     ?                                         ? ? Patient Demographics:  ? ? Benjamin Fox, is a 66 y.o. male, DOB - 1955-06-26, QBH:[id number] ? ?Outpatient Primary MD for the patient is Martinique, Julie M, NP    LOS - 3  Admit date - 04/22/2021   ? ?Transfer from [facility] for right upper quadrant possible liver laceration bleed.    ? ?Brief Narrative (HPI from H&P)   -  66 y.o. male with history of  chronic A-fib on Xarelto, chronic mesenteric ischemia status post celiac and SMA stenting and subsequent thrombectomy for occluded stents 08/2020, CAD status post RCA stenting, type 2 diabetes, hypertension, hyperlipidemia, depression, anxiety, OSA on CPAP, status post prior gastric band (now removed), recurrent idiopathic pancreatitis (complicated by previous leak requiring PD stenting and PD sphincterotomy), previous pancreatic pseudoaneurysm, appendiceal abscess,  just recently known to Dr. Rush Landmark, after initial office evaluation on 03/26/2021 for recurrent idiopathic pancreatitis.  Patient has a complicated GI history, with underlying history of chronic atrial fibrillation for which she had been on Xarelto, coronary artery disease status post RCA stents, history of chronic mesenteric ischemia status post celiac and mesenteric stents for which she has been on Plavix, sleep apnea, adult onset diabetes mellitus and hypertension. Had multiple admissions with pancreatitis, most recent in December 2022.  After recent office consultation plan was for MRI/MRCP, then possible ERCP. ? ?Patient unfortunately was rehospitalized on 04/14/2021 at [facility] with acute abdominal pain nausea and vomiting. Initial lipase was 433, CT of the abdomen pelvis on the day of admission  showed mild acute on chronic pancreatitis, evidence of cirrhosis intra-abdominal varices, splenomegaly ascites and a distended gallbladder. ? ?Repeat CT on 04/19/2021 showed persistent recurrent acute on chronic pancreatitis reactive changes in the duodenal sweep, and interval enlargement of a right hip perihepatic/subcapsular fluid collection measuring 24 x 11 cm, also loculated right pleural effusion and right lower lobe consolidation. ? ?On 04/21/2021 he had a large volume hematemesis and underwent EGD there which showed gastritis and focal duodenitis, no active bleeding and no esophageal or gastric varices. ? ?On the following day he underwent IR drainage of the perihepatic fluid collection  and immediately had about 1500 cc of bright red blood output from the drain.  He required 2 units of packed RBCs.  Decision was made to transfer here.  Hemoglobin was 8.5 after 2 units.  He has been covered by with Zosyn. ? ? ? Subjective:  ? ?Patient in bed, appears comfortable, denies any headache, no fever, no chest pain or pressure, no shortness of breath , no abdominal pain. No new focal weakness. ? ? ? Assessment  & Plan :  ? ? ?Abdominal pain patient with history of recurrent idiopathic pancreatitis, bowel ischemia, pancreatic pseudoaneurysm, recent upper GI bleed at [facility] with EGD showing gastritis and focal duodenitis, CT scan at [facility] on 04/19/2021 showing right perihepatic fluid collection s/p IR drainage of fluid collection at [facility] on 04/20/2021 with bloody fluid coming out. ?  ?- Due to his recent upper GI bleed and now bloody drainage from his right upper quadrant perihepatic drain his Plavix and Xarelto are on hold, per IR Dr. Hinda Glatter he discussed his case with patient's primary cardiologist discontinue Xarelto permanently, he has received 2 units of packed RBC at [facility] on 04/22/2021.  Continue to monitor H&H, IV PPI, transfuse as needed to keep hemoglobin around  7.5, IR and GI both on board.  EGD done here on 04/23/2021 unremarkable, being prepped for colonoscopy, IR also on board for now right upper quadrant drain fluid is clearing up this could have been spontaneous bleeding which happened upon placement of right upper quadrant drain into subcapsular fluid collection.  Continue to monitor H&H.  For now continue Zosyn.  Repeat CT results from 04/24/2021 noted defer that to GI and IR, due for colonoscopy on 04/25/2021. ? ? ?Abnormal finding on EGD done at [facility].  EGD done at outside hospital showing indeterminate left pyriform sinus lesion, may represent eroded mucosa in the setting of dehydration though oropharyngeal malignancy not excluded.  PCP to ensure outpatient ENT follow-up. ? ?Right lower pleural effusion - likely sympathetic effusion from perihepatic fluid collection, no cough or pleuritic symptoms, likely has compressive atelectasis will continue to monitor for now on Zosyn which will be continued. ?  ?Coronary artery disease -Stable.  Not endorsing any anginal symptoms, aspirin on hold, resume beta-blocker and statin. ?  ?Paroxysmal A-fib [place] vas 2 score of greater than 3.  Half Home dose beta-blocker, calcium channel blocker, Plavix only once GI bleeding has resolved.  No further Xarelto as discussed by IR with patient's primary cardiologist. ? ?History of chronic mesenteric ischemia, SMA stenting and subsequent thrombectomy of occluded stents in 08/2020.  Resume Plavix and statin once stable from GI standpoint, Xarelto to be discontinued per IR permanently. ? ?Abnormal finding on CT scan - -CT done at outside hospital showing wedge-shaped region of diminished attenuation involving the left kidney, suspicious for pyelonephritis.  Infarction felt less likely, no dysuria for now covered with Zosyn for #1 above.  UA unremarkable.  Question if this is infarction. ?  ?HTN (hypertension) - switch to home dose beta-blocker, home dose calcium channel blocker along  with as needed IV hydralazine. ?  ?Hypokalemia and hypomagnesemia.  Replaced.   ? ?OSA on CPAP -Continue nightly CPAP ? ?Type 2 diabetes mellitus (HCC)  - A1c 5.8 on 04/15/2021 on labs done at outside facility, gentle D5 drip and no insulin continue to monitor. ? ?CBG (last 3)  ?Recent Labs  ?  04/25/21 ?0403 04/25/21 ?2671 04/25/21 ?0825  ?GLUCAP 93 95 85  ? ?  ? ?   ? ?  Condition - Extremely Guarded ? ?Family Communication  :  None present, patient wants to communicate his medical history himself to the family ? ?Code Status :  Full ? ?Consults  :  IR, GI ? ?PUD Prophylaxis : PPI ? ? Procedures  :    ? ?CT scan abdomen pelvis.  - 1. Substantially reduced volume of the potentially loculated right perihepatic fluid collection with interval placement of a small caliber pigtail drainage catheter. There is a small amount of extraluminal gas in the perihepatic fluid collection adjacent to the pigtail catheter. Small amount of pelvic ascites. 2. Continued substantial wall thickening in the ascending colon favoring inflammation. This colitis is not specific with regard to etiology by ischemia, inflammatory bowel disease, or infection. The patient does have markedly severe atherosclerosis with stents in the SMA and celiac trunk, today's exam was not a CT angiogram and detailed vascular assessment is not feasible on today's exam. 3. Moderate right pleural effusion with right lower lobe atelectasis. 4. New pneumobilia. 5. Coronary atherosclerosis. 6. Chronic calcific pancreatitis. No findings of pancreatic abscess, pseudocyst, or necrosis. 7. Subcutaneous and mesenteric edema ? ?Colonoscopy -  ? ?EGD  04/13/21 -  ? ?Findings: ?     The examined esophagus was moderately tortuous. ?     No gross mucosal lesions were noted in the entire esophagus. ?     There is no endoscopic evidence of varices in the entire esophagus. ?     The Z-line was irregular and was found 44 cm from the incisors. ?     Patchy mildly erythematous mucosa  without bleeding was found in the  ?     entire examined stomach. Biopsies were taken with a cold forceps for  ?     histology and Helicobacter pylori testing. ?     Localized, moderate mucosal changes chara

## 2021-04-25 NOTE — Transfer of Care (Signed)
Immediate Anesthesia Transfer of Care Note ? ?Patient: Benjamin Fox ? ?Procedure(s) Performed: COLONOSCOPY WITH PROPOFOL ? ?Patient Location: PACU ? ?Anesthesia Type:MAC ? ?Level of Consciousness: awake, alert  and patient cooperative ? ?Airway & Oxygen Therapy: Patient Spontanous Breathing and Patient connected to nasal cannula oxygen ? ?Post-op Assessment: Report given to RN and Post -op Vital signs reviewed and stable ? ?Post vital signs: Reviewed and stable ? ?Last Vitals:  ?Vitals Value Taken Time  ?BP 128/73 04/25/21 1158  ?Temp 36.3 ?C 04/25/21 1158  ?Pulse 54 04/25/21 1203  ?Resp 20 04/25/21 1203  ?SpO2 97 % 04/25/21 1203  ?Vitals shown include unvalidated device data. ? ?Last Pain:  ?Vitals:  ? 04/25/21 1158  ?TempSrc:   ?PainSc: Asleep  ?   ? ?Patients Stated Pain Goal: 2 (04/23/21 0324) ? ?Complications: No notable events documented. ?

## 2021-04-25 NOTE — Op Note (Signed)
[facility] ?Patient Name: Benjamin Fox ?Procedure Date : 04/25/2021 ?MRN: [id number] ?Attending MD: Estill Cotta. Loletha Carrow , MD ?Date of Birth: [birth year] ?CSN: [id number] ?Age: 66 ?Admit Type: Inpatient ?Procedure:                Colonoscopy ?Indications:              Melena, Acute post hemorrhagic anemia, Abnormal CT  ?                          of the GI tract (suggesting proximal right colon  ?                          wall thickening) ?                          no bleeding source on EGD. ?                          peri-pancreatic fluid collection with bloody  ?                          drainage that has been decreasing - no hemobilia  ?                          seen during EGD. ?Providers:                Estill Cotta. Loletha Carrow, MD, Elmer Ramp. Tilden Dome, RN, Alphonzo Grieve  ?                          Leighton Roach, Technician ?Referring MD:             [facility] ?Medicines:                Monitored Anesthesia Care ?Complications:            No immediate complications. ?Estimated Blood Loss:     Estimated blood loss: none. ?Procedure:                Pre-Anesthesia Assessment: ?                          - Prior to the procedure, a History and Physical  ?                          was performed, and patient medications and  ?                          allergies were reviewed. The patient's tolerance of  ?                          previous anesthesia was also reviewed. The risks  ?                          and benefits of the procedure and the sedation  ?                          options and risks were discussed with the patient.  ?  All questions were answered, and informed consent  ?                          was obtained. Prior Anticoagulants: The patient has  ?                          taken Xarelto (rivaroxaban), last dose was 5 days  ?                          prior to procedure. ASA Grade Assessment: III - A  ?                          patient with severe systemic disease. After  ?                           reviewing the risks and benefits, the patient was  ?                          deemed in satisfactory condition to undergo the  ?                          procedure. ?                          After obtaining informed consent, the colonoscope  ?                          was passed under direct vision. Throughout the  ?                          procedure, the patient's blood pressure, pulse, and  ?                          oxygen saturations were monitored continuously. The  ?                          CF-HQ190L ([id number]) Olympus coloscope was  ?                          introduced through the anus and advanced to the the  ?                          terminal ileum, with identification of the  ?                          appendiceal orifice and IC valve. The colonoscopy  ?                          was somewhat difficult due to a redundant colon.  ?                          Successful completion of the procedure was aided by  ?  using manual pressure, straightening and shortening  ?                          the scope to obtain bowel loop reduction and  ?                          lavage. The patient tolerated the procedure well.  ?                          The quality of the bowel preparation was fair. The  ?                          ileocecal valve, other areas of the colon and  ?                          rectum were photographed (photos of the TI, AO and  ?                          colon polyp did not capsure due to technical  ?                          problems). The bowel preparation used was GoLYTELY. ?Scope In: 11:36:12 AM ?Scope Out: 11:53:23 AM ?Scope Withdrawal Time: 0 hours 12 minutes 9 seconds  ?Total Procedure Duration: 0 hours 17 minutes 11 seconds  ?Findings: ?     The perianal and digital rectal examinations were normal. ?     The terminal ileum appeared normal. ICV protuberant (see photo) ?     A 5 mm polyp was found in the proximal ascending colon. The polyp was  ?      sessile. Not removed, so as not to risk bleeding that may confuse  ?     clinical picture re: source of acute bleeding. ?     Adherent stool was found in the entire colon, interfering with  ?     visualization. Lavage was performed, resulting in incomplete clearance  ?     with fair visualization. ?     Internal hemorrhoids were found. ?     The exam was otherwise normal throughout the examined colon. (given  ?     limiations of prep quality). There was no fresh or old blood in the TI  ?     or colon. No colitis. ?Impression:               - Preparation of the colon was fair, limiting  ?                          visualization. No bleeding source seen, which may  ?                          have been in the small bowel. ?                          - The examined portion of the ileum was normal. ?                          -  One 5 mm polyp in the proximal ascending colon. ?                          - Stool in the entire examined colon. ?                          - Internal hemorrhoids. ?                          - No specimens collected. ?Recommendation:           - Return patient to hospital ward for ongoing care. ?                          - Resume regular diet. ?                          - Will discuss case further with entire GI team. If  ?                          small bowel video capsule study to be done, most  ?                          likely day after tomorrow. ?                          - After recovery from acute illness, especially  ?                          management of complicated pancreatitis, repeat  ?                          colonoscopy as outpatient with better quality bowel  ?                          preparation for polyp detection and removal. ?Procedure Code(s):        --- Professional --- ?                          [phone], Colonoscopy, flexible; diagnostic, including  ?                          collection of specimen(s) by brushing or washing,  ?                          when performed (separate  procedure) ?Diagnosis Code(s):        --- Professional --- ?                          N27.7, Other hemorrhoids ?                          K63.5, Polyp of colon ?                          K92.1, Melena (includes Hematochezia) ?  D62, Acute posthemorrhagic anemia ?                          R93.3, Abnormal findings on diagnostic imaging of  ?                          other parts of digestive tract ?CPT copyright 2019 [employer] Association. All rights reserved. ?The codes documented in this report are preliminary and upon coder review may  ?be revised to meet current compliance requirements. ?Lakoda Raske L. Loletha Carrow, MD ?04/25/2021 12:13:39 PM ?This report has been signed electronically. ?Number of Addenda: 0 ?

## 2021-04-25 NOTE — Anesthesia Postprocedure Evaluation (Signed)
Anesthesia Post Note ? ?Patient: Yafet Cline ? ?Procedure(s) Performed: COLONOSCOPY WITH PROPOFOL ? ?  ? ?Patient location during evaluation: Endoscopy ?Anesthesia Type: MAC ?Level of consciousness: awake ?Pain management: pain level controlled ?Vital Signs Assessment: post-procedure vital signs reviewed and stable ?Respiratory status: spontaneous breathing, nonlabored ventilation, respiratory function stable and patient connected to nasal cannula oxygen ?Cardiovascular status: stable and blood pressure returned to baseline ?Postop Assessment: no apparent nausea or vomiting ?Anesthetic complications: no ? ? ?No notable events documented. ? ?Last Vitals:  ?Vitals:  ? 04/25/21 1709 04/25/21 1925  ?BP: 135/78 131/77  ?Pulse: 79 79  ?Resp: 20   ?Temp: 36.7 ?C 36.6 ?C  ?SpO2:    ?  ?Last Pain:  ?Vitals:  ? 04/25/21 1925  ?TempSrc: Oral  ?PainSc:   ? ? ?  ?  ?  ?  ?  ?  ? ?Jet Armbrust P Luella Gardenhire ? ? ? ? ?

## 2021-04-25 NOTE — Interval H&P Note (Signed)
History and Physical Interval Note: ? ?04/25/2021 ?11:26 AM ? ?Benjamin Fox  has presented today for surgery, with the diagnosis of GI bleed.  The various methods of treatment have been discussed with the patient and family. After consideration of risks, benefits and other options for treatment, the patient has consented to  Procedure(s): ?COLONOSCOPY WITH PROPOFOL (N/A) as a surgical intervention.  The patient's history has been reviewed, patient examined, no change in status, stable for surgery.  I have reviewed the patient's chart and labs.  Questions were answered to the patient's satisfaction.   ? ?Signout received from Dr. Rush Landmark, and I reviewed recent GI consult and progress notes as well as labs and other pertinent records.  Potassium 3.2 and hemoglobin 8.2 today. ?I interviewed and examined this patient in the endoscopy preprocedure area.  He currently denies chest pain dyspnea or acute abdominal pain.  He has pain in the right upper quadrant at the site of his percutaneous drain, which has continued bloody drainage. ?Recent upper endoscopy and sigmoidoscopy reports reviewed.  CT abdomen and pelvis report from yesterday also reviewed showing decrease in the size of peripancreatic fluid collection and other findings related to that, as well as severe atherosclerosis with SMA and celiac stents as well as marked thickening of the right colon. ? ?This patient reports that the bleeding stopped by the end of his bowel preparation. ? ?Proceed with colonoscopy for hopeful localization of GI bleeding source.  If negative, will discuss further with Dr. Rush Landmark, but it sounds like the plans were for probable VCE. ? ? The benefits and risks of the planned procedure were described in detail with the patient or (when appropriate) their health care proxy.  Risks were outlined as including, but not limited to, bleeding, infection, perforation, adverse medication reaction leading to cardiac or pulmonary  decompensation, pancreatitis (if ERCP).  The limitation of incomplete mucosal visualization was also discussed.  No guarantees or warranties were given. ? ? ?Nelida Meuse III ? ? ?

## 2021-04-26 ENCOUNTER — Inpatient Hospital Stay (HOSPITAL_COMMUNITY): Payer: BC Managed Care – PPO

## 2021-04-26 ENCOUNTER — Encounter (HOSPITAL_COMMUNITY): Payer: Self-pay | Admitting: Gastroenterology

## 2021-04-26 DIAGNOSIS — K7689 Other specified diseases of liver: Secondary | ICD-10-CM | POA: Diagnosis not present

## 2021-04-26 DIAGNOSIS — Z7901 Long term (current) use of anticoagulants: Secondary | ICD-10-CM

## 2021-04-26 DIAGNOSIS — K861 Other chronic pancreatitis: Secondary | ICD-10-CM | POA: Diagnosis not present

## 2021-04-26 DIAGNOSIS — R9389 Abnormal findings on diagnostic imaging of other specified body structures: Secondary | ICD-10-CM | POA: Diagnosis not present

## 2021-04-26 DIAGNOSIS — K921 Melena: Secondary | ICD-10-CM | POA: Diagnosis not present

## 2021-04-26 DIAGNOSIS — Z7902 Long term (current) use of antithrombotics/antiplatelets: Secondary | ICD-10-CM

## 2021-04-26 LAB — CBC WITH DIFFERENTIAL/PLATELET
Abs Immature Granulocytes: 0.09 10*3/uL — ABNORMAL HIGH (ref 0.00–0.07)
Basophils Absolute: 0 10*3/uL (ref 0.0–0.1)
Basophils Relative: 0 %
Eosinophils Absolute: 0 10*3/uL (ref 0.0–0.5)
Eosinophils Relative: 0 %
HCT: 27.4 % — ABNORMAL LOW (ref 39.0–52.0)
Hemoglobin: 9 g/dL — ABNORMAL LOW (ref 13.0–17.0)
Immature Granulocytes: 1 %
Lymphocytes Relative: 17 %
Lymphs Abs: 1.1 10*3/uL (ref 0.7–4.0)
MCH: 30.3 pg (ref 26.0–34.0)
MCHC: 32.8 g/dL (ref 30.0–36.0)
MCV: 92.3 fL (ref 80.0–100.0)
Monocytes Absolute: 0.5 10*3/uL (ref 0.1–1.0)
Monocytes Relative: 7 %
Neutro Abs: 5.1 10*3/uL (ref 1.7–7.7)
Neutrophils Relative %: 75 %
Platelets: 222 10*3/uL (ref 150–400)
RBC: 2.97 MIL/uL — ABNORMAL LOW (ref 4.22–5.81)
RDW: 16.6 % — ABNORMAL HIGH (ref 11.5–15.5)
WBC: 6.9 10*3/uL (ref 4.0–10.5)
nRBC: 0 % (ref 0.0–0.2)

## 2021-04-26 LAB — GLUCOSE, CAPILLARY
Glucose-Capillary: 99 mg/dL (ref 70–99)
Glucose-Capillary: 82 mg/dL (ref 70–99)
Glucose-Capillary: 132 mg/dL — ABNORMAL HIGH (ref 70–99)
Glucose-Capillary: 90 mg/dL (ref 70–99)
Glucose-Capillary: 163 mg/dL — ABNORMAL HIGH (ref 70–99)

## 2021-04-26 LAB — COMPREHENSIVE METABOLIC PANEL
ALT: 11 U/L (ref 0–44)
AST: 14 U/L — ABNORMAL LOW (ref 15–41)
Albumin: 2 g/dL — ABNORMAL LOW (ref 3.5–5.0)
Alkaline Phosphatase: 103 U/L (ref 38–126)
Anion gap: 5 (ref 5–15)
BUN: <5 mg/dL — ABNORMAL LOW (ref 8–23)
CO2: 27 mmol/L (ref 22–32)
Calcium: 7.7 mg/dL — ABNORMAL LOW (ref 8.9–10.3)
Chloride: 110 mmol/L (ref 98–111)
Creatinine, Ser: 0.58 mg/dL — ABNORMAL LOW (ref 0.61–1.24)
GFR, Estimated: >60 mL/min (ref >60)
Glucose, Bld: 128 mg/dL — ABNORMAL HIGH (ref 70–99)
Potassium: 3.6 mmol/L (ref 3.5–5.1)
Sodium: 142 mmol/L (ref 135–145)
Total Bilirubin: 0.4 mg/dL (ref 0.3–1.2)
Total Protein: 5.2 g/dL — ABNORMAL LOW (ref 6.5–8.1)

## 2021-04-26 LAB — BRAIN NATRIURETIC PEPTIDE: B Natriuretic Peptide: 267.8 pg/mL — ABNORMAL HIGH (ref 0.0–100.0)

## 2021-04-26 LAB — MAGNESIUM: Magnesium: 1.8 mg/dL (ref 1.7–2.4)

## 2021-04-26 LAB — PROCALCITONIN: Procalcitonin: <0.1 ng/mL

## 2021-04-26 MED ORDER — PEG-KCL-NACL-NASULF-NA ASC-C 100 G PO SOLR
0.5000 | Freq: Once | ORAL | Status: AC
  Administered 2021-04-26: 100 g via ORAL
  Filled 2021-04-26: qty 1

## 2021-04-26 MED ORDER — POTASSIUM CHLORIDE CRYS ER 20 MEQ PO TBCR
40.0000 meq | EXTENDED_RELEASE_TABLET | Freq: Once | ORAL | Status: AC
  Administered 2021-04-26: 40 meq via ORAL
  Filled 2021-04-26: qty 2

## 2021-04-26 NOTE — Progress Notes (Signed)
?                                  PROGRESS NOTE                                             ?                                                                                                                     ?                                         ? ? Patient Demographics:  ? ? Benjamin Fox, is a 66 y.o. male, DOB - [birth year], TSV:[id number] ? ?Outpatient Primary MD for the patient is [place], Julie M, NP    LOS - 4  Admit date - 04/22/2021   ? ?Transfer from [facility] for right upper quadrant possible liver laceration bleed.    ? ?Brief Narrative (HPI from H&P)   -  66 y.o. male with history of  chronic A-fib on Xarelto, chronic mesenteric ischemia status post celiac and SMA stenting and subsequent thrombectomy for occluded stents 08/2020, CAD status post RCA stenting, type 2 diabetes, hypertension, hyperlipidemia, depression, anxiety, OSA on CPAP, status post prior gastric band (now removed), recurrent idiopathic pancreatitis (complicated by previous leak requiring PD stenting and PD sphincterotomy), previous pancreatic pseudoaneurysm, appendiceal abscess,  just recently known to Dr. Rush Landmark, after initial office evaluation on 03/26/2021 for recurrent idiopathic pancreatitis.  Patient has a complicated GI history, with underlying history of chronic atrial fibrillation for which she had been on Xarelto, coronary artery disease status post RCA stents, history of chronic mesenteric ischemia status post celiac and mesenteric stents for which she has been on Plavix, sleep apnea, adult onset diabetes mellitus and hypertension. Had multiple admissions with pancreatitis, most recent in December 2022.  After recent office consultation plan was for MRI/MRCP, then possible ERCP. ? ?Patient unfortunately was rehospitalized on 04/14/2021 at [facility] with acute abdominal pain nausea and vomiting. Initial lipase was 433, CT of the abdomen pelvis on the day of admission  showed mild acute on chronic pancreatitis, evidence of cirrhosis intra-abdominal varices, splenomegaly ascites and a distended gallbladder. ? ?Repeat CT on 04/19/2021 showed persistent recurrent acute on chronic pancreatitis reactive changes in the duodenal sweep, and interval enlargement of a right hip perihepatic/subcapsular fluid collection measuring 24 x 11 cm, also loculated right pleural effusion and right lower lobe consolidation. ? ?On 04/21/2021 he had a large volume hematemesis and underwent EGD there which showed gastritis and focal duodenitis, no active bleeding and no esophageal or gastric varices. ? ?On the following day he underwent IR drainage of the perihepatic fluid collection  and immediately had about 1500 cc of bright red blood output from the drain.  He required 2 units of packed RBCs.  Decision was made to transfer here.  Hemoglobin was 8.5 after 2 units.  He has been covered by with Zosyn. ? ? ? Subjective:  ? ?Patient in bed, appears comfortable, denies any headache, no fever, no chest pain or pressure, no shortness of breath , no abdominal pain. No focal weakness. ? ? Assessment  & Plan :  ? ? ?Abdominal pain patient with history of recurrent idiopathic pancreatitis, bowel ischemia, pancreatic pseudoaneurysm, recent upper GI bleed at [facility] with EGD showing gastritis and focal duodenitis, CT scan at [facility] on 04/19/2021 showing right perihepatic fluid collection s/p IR drainage of fluid collection at [facility] on 04/20/2021 with bloody fluid coming out. ?  ?- Due to his recent upper GI bleed and now bloody drainage from his right upper quadrant perihepatic drain his Plavix and Xarelto are on hold, per IR Dr. Hinda Glatter he discussed his case with patient's primary cardiologist discontinue Xarelto permanently, he has received 2 units of packed RBC at [facility] on 04/22/2021.  Continue to monitor H&H, IV PPI, transfuse as needed to keep hemoglobin around 7.5, IR  and GI both on board.  EGD done here on 04/23/2021 unremarkable, colonoscopy done 04/25/2021 except for polyp unremarkable.  GI planning to do capsule endoscopy. ? ?GI and IR also on board for now right upper quadrant drain fluid is clearing up this could have been spontaneous bleeding which happened upon placement of right upper quadrant drain into subcapsular fluid collection.  Continue to monitor H&H.  For now continue Zosyn and will complete total of 7 days. ? ? ?Abnormal finding on EGD done at [facility].  EGD done at outside hospital showing indeterminate left pyriform sinus lesion, may represent eroded mucosa in the setting of dehydration though oropharyngeal malignancy not excluded.  PCP to ensure outpatient ENT follow-up. ? ?Right lower pleural effusion - likely sympathetic effusion from perihepatic fluid collection, no cough or pleuritic symptoms, likely has compressive atelectasis will continue to monitor for now on Zosyn which will be continued. ?  ?Coronary artery disease -Stable.  Not endorsing any anginal symptoms, aspirin on hold, resume beta-blocker and statin. ?  ?Paroxysmal A-fib [place] vas 2 score of greater than 3.  Half Home dose beta-blocker, calcium channel blocker, Plavix only once GI bleeding has resolved.  No further Xarelto as discussed by IR with patient's primary cardiologist. ? ?History of chronic mesenteric ischemia, SMA stenting and subsequent thrombectomy of occluded stents in 08/2020.  Resume Plavix and statin once stable from GI standpoint discussed on 04/26/2021, Xarelto to be discontinued per IR permanently. ? ?Abnormal finding on CT scan - -CT done at outside hospital showing wedge-shaped region of diminished attenuation involving the left kidney, suspicious for pyelonephritis.  Infarction felt less likely, no dysuria for now covered with Zosyn for #1 above. UA unremarkable. Question if this is infarction. ?  ?HTN (hypertension) - switch to home dose beta-blocker, home dose calcium  channel blocker along with as needed IV hydralazine. ?  ?Hypokalemia and hypomagnesemia.  Replaced.   ? ?OSA on CPAP - Continue nightly CPAP ? ?Type 2 diabetes mellitus (HCC)  - A1c 5.8 on 04/15/2021 on labs done at outside facility, monitor   ? ?CBG (last 3)  ?Recent Labs  ?  04/25/21 ?2357 04/26/21 ?8676 04/26/21 ?7209  ?GLUCAP 136* 99 82  ? ?  ? ?   ? ?Condition -  Extremely Guarded ? ?Family Communication  :  None present, patient wants to communicate his medical history himself to the family ? ?Code Status :  Full ? ?Consults  :  IR, GI ? ?PUD Prophylaxis : PPI ? ? Procedures  :    ? ?CT scan abdomen pelvis.  - 1. Substantially reduced volume of the potentially loculated right perihepatic fluid collection with interval placement of a small caliber pigtail drainage catheter. There is a small amount of extraluminal gas in the perihepatic fluid collection adjacent to the pigtail catheter. Small amount of pelvic ascites. 2. Continued substantial wall thickening in the ascending colon favoring inflammation. This colitis is not specific with regard to etiology by ischemia, inflammatory bowel disease, or infection. The patient does have markedly severe atherosclerosis with stents in the SMA and celiac trunk, today's exam was not a CT angiogram and detailed vascular assessment is not feasible on today's exam. 3. Moderate right pleural effusion with right lower lobe atelectasis. 4. New pneumobilia. 5. Coronary atherosclerosis. 6. Chronic calcific pancreatitis. No findings of pancreatic abscess, pseudocyst, or necrosis. 7. Subcutaneous and mesenteric edema ? ?Colonoscopy -  ? ?Impression:               - Preparation of the colon was fair, limiting  ?                          visualization. No bleeding source seen, which may  ?                          have been in the small bowel. ?                          - The examined portion of the ileum was normal. ?                          - One 5 mm polyp in the proximal ascending  colon. ?                          - Stool in the entire examined colon. ?                          - Internal hemorrhoids. ?                          - No specimens collected. ?Recommendation:

## 2021-04-26 NOTE — Progress Notes (Signed)
GI Progress Note ? ?Chief Complaint: Obscure overt GI bleeding with melena and acute GI blood loss ? ?History: ? ?No further bleeding since before patient finishes bowel preparation.  He has pain at the right upper quadrant drain site as before.  Has been able to eat solid food without nausea vomiting or increasing pain. ? ?ROS: ?Cardiovascular: Denies chest pain ?Respiratory: Denies dyspnea ?Urinary: Denies dysuria ? ?Objective: ? ? ?Current Facility-Administered Medications:  ?  atorvastatin (LIPITOR) tablet 10 mg, 10 mg, Oral, Daily, Danis, Estill Cotta III, MD, 10 mg at 04/26/21 0825 ?  carvedilol (COREG) tablet 25 mg, 25 mg, Oral, BID, Nelida Meuse III, MD, 25 mg at 04/26/21 0825 ?  diltiazem (CARDIZEM CD) 24 hr capsule 240 mg, 240 mg, Oral, q morning, Danis, Estill Cotta III, MD, 240 mg at 04/26/21 0825 ?  feeding supplement (BOOST / RESOURCE BREEZE) liquid 1 Container, 1 Container, Oral, TID BM, Nelida Meuse III, MD, 1 Container at 04/25/21 1348 ?  hydrALAZINE (APRESOLINE) injection 10 mg, 10 mg, Intravenous, Q6H PRN, Nelida Meuse III, MD, 10 mg at 04/25/21 0615 ?  HYDROcodone-acetaminophen (NORCO) 7.5-325 MG per tablet 1 tablet, 1 tablet, Oral, Q6H PRN, Danis, Estill Cotta III, MD ?  HYDROmorphone (DILAUDID) injection 1 mg, 1 mg, Intravenous, Q4H PRN, Nelida Meuse III, MD, 1 mg at 04/25/21 2245 ?  lactated ringers infusion, , Intravenous, Continuous, Danis, Estill Cotta III, MD, Stopped at 04/25/21 1200 ?  sertraline (ZOLOFT) tablet 100 mg, 100 mg, Oral, QPM, Nelida Meuse III, MD, 100 mg at 04/25/21 1748 ?  sodium chloride flush (NS) 0.9 % injection 5 mL, 5 mL, Intracatheter, TID, Danis, Estill Cotta III, MD, 5 mL at 04/26/21 0946 ? ? lactated ringers Stopped (04/25/21 1200)  ?  ? ?Vital signs in last 24 hrs: ?Vitals:  ? 04/26/21 0400 04/26/21 0800  ?BP: 127/62   ?Pulse: 66 66  ?Resp: 15   ?Temp: 98.1 ?F (36.7 ?C) (!) 97.5 ?F (36.4 ?C)  ?SpO2: 97% 98%  ? ? ?Intake/Output Summary (Last 24 hours) at 04/26/2021  1101 ?Last data filed at 04/25/2021 1200 ?Gross per 24 hour  ?Intake 200 ml  ?Output --  ?Net 200 ml  ? ? ? ?Physical Exam ?Sitting in bedside chair, wife on the phone.  He is pleasant conversational and in good spirits ?HEENT: sclera anicteric, oral mucosa without lesions ?Neck: supple, no thyromegaly, JVD or lymphadenopathy ?Cardiac: RRR without murmurs, S1S2 heard, no peripheral edema ?Pulm: clear to auscultation bilaterally, normal RR and effort noted ?Abdomen: soft, right upper quadrant drain with serosanguineous fluid, no abdominal tenderness, with active bowel sounds. No guarding or palpable hepatosplenomegaly ?Skin; warm and dry, no jaundice.  Pale ? ?Recent Labs: ? ? ?  Latest Ref Rng & Units 04/26/2021  ?  1:16 AM 04/25/2021  ?  1:07 AM 04/24/2021  ?  1:18 AM  ?CBC  ?WBC 4.0 - 10.5 K/uL 6.9   7.7   8.5    ?Hemoglobin 13.0 - 17.0 g/dL 9.0   8.2   8.6    ?Hematocrit 39.0 - 52.0 % 27.4   25.7   27.0    ?Platelets 150 - 400 K/uL 222   221   249    ? ? ?Recent Labs  ?Lab 04/23/21 ?0305  ?INR 1.2  ? ? ?  Latest Ref Rng & Units 04/26/2021  ?  1:16 AM 04/25/2021  ?  1:07 AM 04/24/2021  ?  1:18 AM  ?CMP  ?  Glucose 70 - 99 mg/dL 128   102   92    ?BUN 8 - 23 mg/dL <5   <5   <5    ?Creatinine 0.61 - 1.24 mg/dL 0.58   0.57   0.62    ?Sodium 135 - 145 mmol/L 142   142   138    ?Potassium 3.5 - 5.1 mmol/L 3.6   3.2   3.2    ?Chloride 98 - 111 mmol/L 110   107   99    ?CO2 22 - 32 mmol/L '27   26   28    '$ ?Calcium 8.9 - 10.3 mg/dL 7.7   7.8   7.7    ?Total Protein 6.5 - 8.1 g/dL 5.2   5.3   5.1    ?Total Bilirubin 0.3 - 1.2 mg/dL 0.4   1.0   1.0    ?Alkaline Phos 38 - 126 U/L 103   109   127    ?AST 15 - 41 U/L '14   18   18    '$ ?ALT 0 - 44 U/L '11   12   12    '$ ? ? ? ?Radiologic studies: ? ? ?Assessment & Plan  ?Assessment: ?Melena ?Acute blood loss anemia ? ?Complex recurrent pancreatitis with peripancreatic fluid collection with percutaneous drain. ? ?GI bleeding stopped over 24 hours ago.  No source on EGD or colonoscopy. ?Peripheral  arterial disease requiring mesenteric stents, on chronic antiplatelet therapy ?Atrial fibrillation, was on [facility], but hospitalist service indicates cardiologist has discontinued it. ? ? ?Plan: ?Small bowel video capsule study tomorrow (orders will be placed by PA) ?Procedure described in detail along with approximate 1% risk of capsule retention that may cause bowel obstruction.  Patient is not known or suspected to have small bowel stricture or other anatomic abnormality that would increase the risk of capsule retention and obstruction. ? ?Plavix can be resumed today, and will message Dr. Candiss Norse of [facility] about this. ? ?You can discontinue any standing serial hemoglobins that may be on order, and a daily check will be sufficient unless patient has recurrent bleeding, at which point stat CBC is warranted.  Should that occur, also contact the GI service immediately. ? ? ?Nelida Meuse III ?Office: [phone] ? ?

## 2021-04-26 NOTE — Plan of Care (Signed)
Pt overall had a good shift.  Medicated per MAR.  Pt tolerating foods well at this time.  Pt has no other reported issues this shift. ? ?Problem: Pain Managment: ?Goal: General experience of comfort will improve ?Outcome: Progressing ?  ?

## 2021-04-27 ENCOUNTER — Encounter (HOSPITAL_COMMUNITY)
Admission: AD | Disposition: A | Discharge status: Home / Self Care | Payer: Self-pay | Source: Other Acute Inpatient Hospital | Attending: Internal Medicine

## 2021-04-27 ENCOUNTER — Encounter (HOSPITAL_COMMUNITY): Payer: Self-pay | Admitting: Internal Medicine

## 2021-04-27 DIAGNOSIS — R6889 Other general symptoms and signs: Secondary | ICD-10-CM | POA: Diagnosis not present

## 2021-04-27 DIAGNOSIS — R9389 Abnormal findings on diagnostic imaging of other specified body structures: Secondary | ICD-10-CM | POA: Diagnosis not present

## 2021-04-27 DIAGNOSIS — K5520 Angiodysplasia of colon without hemorrhage: Secondary | ICD-10-CM

## 2021-04-27 HISTORY — PX: GIVENS CAPSULE STUDY: SHX5432

## 2021-04-27 LAB — CBC WITH DIFFERENTIAL/PLATELET
Abs Immature Granulocytes: 0.08 10*3/uL — ABNORMAL HIGH (ref 0.00–0.07)
Basophils Absolute: 0 10*3/uL (ref 0.0–0.1)
Basophils Relative: 0 %
Eosinophils Absolute: 0.1 10*3/uL (ref 0.0–0.5)
Eosinophils Relative: 1 %
HCT: 27 % — ABNORMAL LOW (ref 39.0–52.0)
Hemoglobin: 8.5 g/dL — ABNORMAL LOW (ref 13.0–17.0)
Immature Granulocytes: 1 %
Lymphocytes Relative: 15 %
Lymphs Abs: 1.2 10*3/uL (ref 0.7–4.0)
MCH: 29.8 pg (ref 26.0–34.0)
MCHC: 31.5 g/dL (ref 30.0–36.0)
MCV: 94.7 fL (ref 80.0–100.0)
Monocytes Absolute: 0.7 10*3/uL (ref 0.1–1.0)
Monocytes Relative: 9 %
Neutro Abs: 6.1 10*3/uL (ref 1.7–7.7)
Neutrophils Relative %: 74 %
Platelets: 223 10*3/uL (ref 150–400)
RBC: 2.85 MIL/uL — ABNORMAL LOW (ref 4.22–5.81)
RDW: 16.7 % — ABNORMAL HIGH (ref 11.5–15.5)
WBC: 8.1 10*3/uL (ref 4.0–10.5)
nRBC: 0 % (ref 0.0–0.2)

## 2021-04-27 LAB — COMPREHENSIVE METABOLIC PANEL
ALT: 11 U/L (ref 0–44)
AST: 15 U/L (ref 15–41)
Albumin: 2.2 g/dL — ABNORMAL LOW (ref 3.5–5.0)
Alkaline Phosphatase: 92 U/L (ref 38–126)
Anion gap: 8 (ref 5–15)
BUN: <5 mg/dL — ABNORMAL LOW (ref 8–23)
CO2: 22 mmol/L (ref 22–32)
Calcium: 7.9 mg/dL — ABNORMAL LOW (ref 8.9–10.3)
Chloride: 109 mmol/L (ref 98–111)
Creatinine, Ser: 0.61 mg/dL (ref 0.61–1.24)
GFR, Estimated: >60 mL/min (ref >60)
Glucose, Bld: 114 mg/dL — ABNORMAL HIGH (ref 70–99)
Potassium: 3.8 mmol/L (ref 3.5–5.1)
Sodium: 139 mmol/L (ref 135–145)
Total Bilirubin: 0.8 mg/dL (ref 0.3–1.2)
Total Protein: 5.5 g/dL — ABNORMAL LOW (ref 6.5–8.1)

## 2021-04-27 LAB — GLUCOSE, CAPILLARY
Glucose-Capillary: 100 mg/dL — ABNORMAL HIGH (ref 70–99)
Glucose-Capillary: 101 mg/dL — ABNORMAL HIGH (ref 70–99)
Glucose-Capillary: 107 mg/dL — ABNORMAL HIGH (ref 70–99)
Glucose-Capillary: 109 mg/dL — ABNORMAL HIGH (ref 70–99)
Glucose-Capillary: 112 mg/dL — ABNORMAL HIGH (ref 70–99)
Glucose-Capillary: 118 mg/dL — ABNORMAL HIGH (ref 70–99)

## 2021-04-27 LAB — MAGNESIUM: Magnesium: 1.6 mg/dL — ABNORMAL LOW (ref 1.7–2.4)

## 2021-04-27 LAB — PROCALCITONIN: Procalcitonin: <0.1 ng/mL

## 2021-04-27 LAB — BRAIN NATRIURETIC PEPTIDE: B Natriuretic Peptide: 193.9 pg/mL — ABNORMAL HIGH (ref 0.0–100.0)

## 2021-04-27 SURGERY — IMAGING PROCEDURE, GI TRACT, INTRALUMINAL, VIA CAPSULE
Anesthesia: LOCAL

## 2021-04-27 MED ORDER — MAGNESIUM SULFATE 4 GM/100ML IV SOLN
4.0000 g | Freq: Once | INTRAVENOUS | Status: AC
  Administered 2021-04-27: 4 g via INTRAVENOUS
  Filled 2021-04-27: qty 100

## 2021-04-27 MED ORDER — CLOPIDOGREL BISULFATE 75 MG PO TABS
75.0000 mg | ORAL_TABLET | Freq: Every day | ORAL | Status: DC
  Administered 2021-04-27 – 2021-04-28 (×2): 75 mg via ORAL
  Filled 2021-04-27 (×2): qty 1

## 2021-04-27 SURGICAL SUPPLY — 1 items: TOWEL COTTON PACK 4EA (MISCELLANEOUS) ×4 IMPLANT

## 2021-04-27 NOTE — Discharge Instructions (Addendum)
Follow with Primary MD Martinique, Julie M, NP in 7 days, also follow with your Cardiologist, Gastroenterologist, IR physician Dr Serafina Royals and the recommended Pulmonologist in 1-2 weeks.  ? ?Get CBC, CMP, 2 view Chest X ray -  checked next visit within 1 week by Primary MD  ? ?Activity: As tolerated with Full fall precautions use walker/cane & assistance as needed ? ?Disposition Home  ? ?Diet: Heart Healthy  ? ?Special Instructions: If you have smoked or chewed Tobacco  in the last 2 yrs please stop smoking, stop any regular Alcohol  and or any Recreational drug use. ? ?On your next visit with your primary care physician please Get Medicines reviewed and adjusted. ? ?Please request your Prim.MD to go over all Hospital Tests and Procedure/Radiological results at the follow up, please get all Hospital records sent to your Prim MD by signing hospital release before you go home. ? ?If you experience worsening of your admission symptoms, develop shortness of breath, life threatening emergency, suicidal or homicidal thoughts you must seek medical attention immediately by calling 911 or calling your MD immediately  if symptoms less severe. ? ?You Must read complete instructions/literature along with all the possible adverse reactions/side effects for all the Medicines you take and that have been prescribed to you. Take any new Medicines after you have completely understood and accpet all the possible adverse reactions/side effects.  ? ? ?If patient were to be disharged before the drain can be removed in the hospital, our clinic will call patient to set up a follow up appointment for CT scan and possible drain injection at the clinic 1-2 weeks after the discharge.  ?Out patient follow up orders in.  ?  ?Interventional Radiology Out patient Discharge Instruction for the drain and dressing. ?  ?Wash your hands before touching the drain ?Patient to flush the drain with 5 cc normal saline daily ?Record output daily (subtract 5 cc  that was used to flush the drain from the total daily output)  ?Dressing change as needed and at least once a week.  ?You will receive a call from [place] to set up the follow up visit. If you have any questions regarding your appointment, please call [phone].  ?   ?

## 2021-04-27 NOTE — Progress Notes (Signed)
?                                  PROGRESS NOTE                                             ?                                                                                                                     ?                                         ? ? Patient Demographics:  ? ? Benjamin Fox, is a 66 y.o. male, DOB - March 11, 1955, TDD:[id number] ? ?Outpatient Primary MD for the patient is Martinique, Julie M, NP    LOS - 5  Admit date - 04/22/2021   ? ?Transfer from [facility] for right upper quadrant possible liver laceration bleed.    ? ?Brief Narrative (HPI from H&P)   -  66 y.o. male with history of  chronic A-fib on Xarelto, chronic mesenteric ischemia status post celiac and SMA stenting and subsequent thrombectomy for occluded stents 08/2020, CAD status post RCA stenting, type 2 diabetes, hypertension, hyperlipidemia, depression, anxiety, OSA on CPAP, status post prior gastric band (now removed), recurrent idiopathic pancreatitis (complicated by previous leak requiring PD stenting and PD sphincterotomy), previous pancreatic pseudoaneurysm, appendiceal abscess,  just recently known to Dr. Rush Landmark, after initial office evaluation on 03/26/2021 for recurrent idiopathic pancreatitis.  Patient has a complicated GI history, with underlying history of chronic atrial fibrillation for which she had been on Xarelto, coronary artery disease status post RCA stents, history of chronic mesenteric ischemia status post celiac and mesenteric stents for which she has been on Plavix, sleep apnea, adult onset diabetes mellitus and hypertension. Had multiple admissions with pancreatitis, most recent in December 2022.  After recent office consultation plan was for MRI/MRCP, then possible ERCP. ? ?Patient unfortunately was rehospitalized on 04/14/2021 at [facility] with acute abdominal pain nausea and vomiting. Initial lipase was 433, CT of the abdomen pelvis on the day of admission  showed mild acute on chronic pancreatitis, evidence of cirrhosis intra-abdominal varices, splenomegaly ascites and a distended gallbladder. ? ?Repeat CT on 04/19/2021 showed persistent recurrent acute on chronic pancreatitis reactive changes in the duodenal sweep, and interval enlargement of a right hip perihepatic/subcapsular fluid collection measuring 24 x 11 cm, also loculated right pleural effusion and right lower lobe consolidation. ? ?On 04/21/2021 he had a large volume hematemesis and underwent EGD there which showed gastritis and focal duodenitis, no active bleeding and no esophageal or gastric varices. ? ?On the following day he underwent IR drainage of the perihepatic fluid collection  and immediately had about 1500 cc of bright red blood output from the drain.  He required 2 units of packed RBCs.  Decision was made to transfer here.  Hemoglobin was 8.5 after 2 units.  He has been covered by with Zosyn. ? ? ? Subjective:  ? ?Patient in bed, appears comfortable, denies any headache, no fever, no chest pain or pressure, no shortness of breath , no abdominal pain. No new focal weakness. ? ? Assessment  & Plan :  ? ? ?Abdominal pain patient with history of recurrent idiopathic pancreatitis, bowel ischemia, pancreatic pseudoaneurysm, recent upper GI bleed at [facility] with EGD showing gastritis and focal duodenitis, CT scan at [facility] on 04/19/2021 showing right perihepatic fluid collection s/p IR drainage of fluid collection at [facility] on 04/20/2021 with bloody fluid coming out. ?  ?- Due to his recent upper GI bleed and now bloody drainage from his right upper quadrant perihepatic drain his Plavix and Xarelto are on hold, per IR Dr. Hinda Glatter he discussed his case with patient's primary cardiologist discontinue Xarelto permanently, he has received 2 units of packed RBC at [facility] on 04/22/2021.  Continue to monitor H&H, IV PPI, transfuse as needed to keep hemoglobin around 7.5,  IR and GI both on board.  EGD done here on 04/23/2021 unremarkable, colonoscopy done 04/25/2021 except for polyp unremarkable.  GI planning to do capsule endoscopy 04/27/21. ? ?GI and IR also on board for now right upper quadrant drain fluid is clearing up this could have been spontaneous bleeding which happened upon placement of right upper quadrant drain into subcapsular fluid collection.  Continue to monitor H&H.  For now continue Zosyn and will complete total of 7 days. ? ? ?Abnormal finding on EGD done at [facility].  EGD done at outside hospital showing indeterminate left pyriform sinus lesion, may represent eroded mucosa in the setting of dehydration though oropharyngeal malignancy not excluded.  PCP to ensure outpatient ENT follow-up. ? ?Right lower pleural effusion - likely sympathetic effusion from perihepatic fluid collection, no cough or pleuritic symptoms, likely has compressive atelectasis will continue to monitor for now on Zosyn which will be continued. ?  ?Coronary artery disease -Stable.  Not endorsing any anginal symptoms, aspirin on hold, resume beta-blocker and statin. ?  ?Paroxysmal A-fib [place] vas 2 score of greater than 3.  Half Home dose beta-blocker, calcium channel blocker, Plavix only once GI bleeding has resolved.  No further Xarelto as discussed by IR with patient's primary cardiologist. ? ?History of chronic mesenteric ischemia, SMA stenting and subsequent thrombectomy of occluded stents in 08/2020.  On statin , resumed Plavix 04/27/2021, Xarelto to be discontinued per IR permanently. ? ?Abnormal finding on CT scan - -CT done at outside hospital showing wedge-shaped region of diminished attenuation involving the left kidney, suspicious for pyelonephritis.  Infarction felt less likely, no dysuria for now covered with Zosyn for #1 above. UA unremarkable. Question if this is infarction. ?  ?HTN (hypertension) - switch to home dose beta-blocker, home dose calcium channel blocker along with as  needed IV hydralazine. ?  ?Hypokalemia and hypomagnesemia.  Replaced & monitor.    ? ?OSA on CPAP - Continue nightly CPAP ? ?Type 2 diabetes mellitus (HCC)  - A1c 5.8 on 04/15/2021 on labs done at outside facility, monitor   ? ?CBG (last 3)  ?Recent Labs  ?  04/27/21 ?0012 04/27/21 ?8099 04/27/21 ?0750  ?GLUCAP 100* 107* 101*  ? ?  ? ?   ? ?Condition -  Extremely Guarded ? ?Family Communication  :  None present, patient wants to communicate his medical history himself to the family ? ?Code Status :  Full ? ?Consults  :  IR, GI ? ?PUD Prophylaxis : PPI ? ? Procedures  :    ? ?CT scan abdomen pelvis.  - 1. Substantially reduced volume of the potentially loculated right perihepatic fluid collection with interval placement of a small caliber pigtail drainage catheter. There is a small amount of extraluminal gas in the perihepatic fluid collection adjacent to the pigtail catheter. Small amount of pelvic ascites. 2. Continued substantial wall thickening in the ascending colon favoring inflammation. This colitis is not specific with regard to etiology by ischemia, inflammatory bowel disease, or infection. The patient does have markedly severe atherosclerosis with stents in the SMA and celiac trunk, today's exam was not a CT angiogram and detailed vascular assessment is not feasible on today's exam. 3. Moderate right pleural effusion with right lower lobe atelectasis. 4. New pneumobilia. 5. Coronary atherosclerosis. 6. Chronic calcific pancreatitis. No findings of pancreatic abscess, pseudocyst, or necrosis. 7. Subcutaneous and mesenteric edema ? ?Colonoscopy -  ? ?Impression:               - Preparation of the colon was fair, limiting  ?                          visualization. No bleeding source seen, which may  ?                          have been in the small bowel. ?                          - The examined portion of the ileum was normal. ?                          - One 5 mm polyp in the proximal ascending colon. ?                           - Stool in the entire examined colon. ?                          - Internal hemorrhoids. ?                          - No specimens collected. ?Recommendation:           - Return patien

## 2021-04-27 NOTE — Progress Notes (Signed)
? ?       Daily Rounding Note ? ?04/27/2021, 10:17 AM ? LOS: 5 days  ? ?SUBJECTIVE:   ?Chief complaint:   GIB w melena.  ABL anemia.  Hx complicated pancreatitis. ? ?No abd pain, no nausea.  Stools water yellow last night.  VCE swallowed this AM ? ?OBJECTIVE:        ? Vital signs in last 24 hours:    ?Temp:  [97.6 ?F (36.4 ?C)-98.2 ?F (36.8 ?C)] 97.6 ?F (36.4 ?C) (04/03 0746) ?Pulse Rate:  [61-70] 68 (04/03 0746) ?Resp:  [15-19] 16 (04/03 0746) ?BP: (115-137)/(62-95) 135/95 (04/03 0746) ?SpO2:  [98 %] 98 % (04/03 0746) ?Last BM Date : 04/26/21 ?Filed Weights  ? 04/23/21 1356  ?Weight: 113.4 kg  ? ?General: looks comfortable, not acutely ill looking/better than hx would suggest   ?Heart: RRR ?Chest: no dyspnea or cough ?Abdomen: soft, NT, serous fluid w minor/scant in drain bag.  Active BS ?Extremities: no CCE ?Neuro/Psych:  oriented x 3, fluid speech.  Calm.   ? ?Intake/Output from previous day: ?04/02 0701 - 04/03 0700 ?In: 10  ?Out: 765 [Urine:725; Drains:40] ? ?Intake/Output this shift: ?No intake/output data recorded. ? ?Lab Results: ?Recent Labs  ?  04/25/21 ?0107 04/26/21 ?0116 04/27/21 ?5465  ?WBC 7.7 6.9 8.1  ?HGB 8.2* 9.0* 8.5*  ?HCT 25.7* 27.4* 27.0*  ?PLT 221 222 223  ? ?BMET ?Recent Labs  ?  04/25/21 ?0107 04/26/21 ?0116 04/27/21 ?0154  ?NA 142 142 139  ?K 3.2* 3.6 3.8  ?CL 107 110 109  ?CO2 '26 27 22  '$ ?GLUCOSE 102* 128* 114*  ?BUN <5* <5* <5*  ?CREATININE 0.57* 0.58* 0.61  ?CALCIUM 7.8* 7.7* 7.9*  ? ?LFT ?Recent Labs  ?  04/25/21 ?0107 04/26/21 ?0116 04/27/21 ?0154  ?PROT 5.3* 5.2* 5.5*  ?ALBUMIN 2.0* 2.0* 2.2*  ?AST 18 14* 15  ?ALT '12 11 11  '$ ?ALKPHOS 109 103 92  ?BILITOT 1.0 0.4 0.8  ? ?PT/INR ?No results for input(s): LABPROT, INR in the last 72 hours. ?Hepatitis Panel ?No results for input(s): HEPBSAG, HCVAB, HEPAIGM, HEPBIGM in the last 72 hours. ? ?Studies/Results: ?DG Chest Port 1 View ? ?Result Date: 04/26/2021 ?CLINICAL DATA:  66 year old male with  abdominal pain. EXAM: PORTABLE CHEST 1 VIEW COMPARISON:  CT Abdomen and Pelvis 04/24/2021. Chest radiographs 04/23/2021 and earlier. FINDINGS: Portable AP semi upright view at 0714 hours. Continued veiling opacity at the right lung base, not significantly changed from 04/23/2021 and compatible with the right pleural effusion with lower lobe atelectasis demonstrated recently by CT. Lung volumes are stable and mediastinal contours are within normal limits. No pneumothorax. No new pulmonary opacity. Visualized tracheal air column is within normal limits. No acute osseous abnormality identified. Paucity of bowel gas in the upper abdomen. IMPRESSION: 1. Stable right pleural effusion since 04/23/2021. 2. No new cardiopulmonary abnormality. Electronically Signed   By: Genevie Ann M.D.   On: 04/26/2021 08:57   ? ?Scheduled Meds: ? atorvastatin  10 mg Oral Daily  ? carvedilol  25 mg Oral BID  ? clopidogrel  75 mg Oral Daily  ? diltiazem  240 mg Oral q morning  ? feeding supplement  1 Container Oral TID BM  ? sertraline  100 mg Oral QPM  ? sodium chloride flush  5 mL Intracatheter TID  ? ?Continuous Infusions: ? lactated ringers Stopped (04/25/21 1200)  ? ?PRN Meds:.hydrALAZINE, HYDROcodone-acetaminophen, HYDROmorphone (DILAUDID) injection ? ? ?ASSESMENT:  ? ?Blood loss anemia.  Melena, hematochezia, hematemesis.   ?  EGD at [facility] 3/28 w gastritis, duodenitis, no active bleeding, no varices.  Then had gross bleeding from perihepatic fluid collection drain placed 3/29, required at least 2 PRBCs.  ?Now w scant blood per perihepatic drain.  Hgb 9.3 .. 8.5.   MCV normal.  Low iron 21, low iron sats 4%, ferritin 34.  Elevated TIBC.  B12, folate at or above normal. ?04/23/2021 EGD.  Per Dr Jerilynn Mages. Biopsies obtained of granular gastric mucosa with decreased vascular pattern.  Mucosal changes and ulceration on the wall opposite ampulla in D2.  Pancreatic, biliary sphincterotomy appeared open. ?04/25/2021 colonoscopy per Dr Loletha Carrow with fair prep,  limited visualization.  No old or fresh blood, no source for bleeding.  5 mm, sessile polyp not removed.  Nonbleeding internal hemorrhoids. ? ? ?Chronic pancreatitis, hx recurrent acute idiopathic pancreatitis, pseudocyst.  Required pancreatic duct sphincterotomy and stent for leak.  Pancreatic pseudoaneurysm.  Previous appendiceal abscess.  S/p gastric band with complication of perigastric abscess.  ?04/2020 laparoscopic removal of gastric band and port, drain placed, fluid consistent with infected pancreatic pseudocyst. Grew  antibiotic resistant Enterobacteriaceae ?04/03/2021 MRCP: Changes of chronic pancreatitis, stable dilation pancreatic duct, no suspicious masses.  Fat stranding anterior to the pancreas involving duodenum improved and likely represent residual inflammation, scarring of previous pancreatitis. ?Hepatic fluid collection.  Last episode of acute pancreatitis, lipase 433, w hospitalization 3/21 at [facility].  Imaging at that point showed acute on chronic pancreatitis, cirrhosis, intra-abdominal varices, splenomegaly, ascites, distended gallbladder, perihepatic capsular right fluid collection 24 x 11 cm.  3/29 Underwent IR drainage of perihepatic collection.  Developed bleeding via drain.  Latest CT of 3/31 shows substantial improvement in perihepatic collection. ? ?Loculated right pleural effusion, right lower lobe consolidation per CT at [facility]. ? ?Chronic Xarelto (on hold), chronic Plavix (not on hold).  For chronic A-fib, mesenteric vascular disease.  03/2020 celiac, SMA stent placement.  Subsequent stent thrombosis but IR Dr. Serafina Royals able to recanalize SMA in 08/2020.  Duplex ultrasound of mesenteric arteries on 04/08/2021: Somewhat limited by bowel gas.  Segment of SMA with increased velocity, possibly representing focal stenosis. ? ?Underlying cirrhosis?, not confirmed.  Splenomegaly, ascites.  No varices on recent endoscopies.  On 04/03/2021 MRCP there was mild hepatic steatosis,  hepatosplenomegaly and trace perihepatic ascites.  LFTs are normal. ? ?Wall thickening at ascending colon per  CT.  Although stool limited colonic visualization, there was no evidence for colitis or mucosal abnormality on colonoscopy 2 days ago.  04/25/2021 colonoscopy per Dr Loletha Carrow with fair prep, limited visualization.  No old or fresh blood, no source for bleeding.  5 mm, sessile polyp not removed.  Nonbleeding internal hemorrhoids. ? ?PLAN  ? ?  Awaiting results of gastric biopsy from 3/30.   ? ?  Keep outpt ERCP on 5/4 w Dr Jerilynn Mages.  ? ?  Advance diet per VCE protocol.  Can eventually advance to carb mod diet.    Down load and read VCE images tomorrow.   ? ? ? ?Azucena Freed  04/27/2021, 10:17 AM ?Phone [phone]  ?

## 2021-04-27 NOTE — Progress Notes (Signed)
[facility] video capsule endoscopy ordered by MD Danis. Pt ingested capsule at 0858 ? ?Per [facility] capsule instructions, pt shall remain NPO until 1058 at which point he may progress to clear liquids. At 1258 he may have a small snack. At 1658 he may proceed back to ordered diet. ? ?The study will conclude at 2058 at which point the leads and recorder may be removed. Endoscopy staff will pick up the leads and recorder in the AM. ? ?Please reach out to endoscopy at [phone] with any concerns before 1700. After 1700, please reach out to the hospital operator and ask for the endoscopy nurse on call. ? ?Debarah Crape, RN ?04/27/21 ?9:01 AM ? ? ?

## 2021-04-28 ENCOUNTER — Other Ambulatory Visit: Payer: Self-pay

## 2021-04-28 ENCOUNTER — Other Ambulatory Visit (HOSPITAL_COMMUNITY): Payer: Self-pay

## 2021-04-28 ENCOUNTER — Encounter (HOSPITAL_COMMUNITY): Payer: Self-pay | Admitting: Gastroenterology

## 2021-04-28 ENCOUNTER — Other Ambulatory Visit: Payer: Self-pay | Admitting: Student

## 2021-04-28 DIAGNOSIS — K5520 Angiodysplasia of colon without hemorrhage: Secondary | ICD-10-CM

## 2021-04-28 DIAGNOSIS — K625 Hemorrhage of anus and rectum: Secondary | ICD-10-CM

## 2021-04-28 DIAGNOSIS — K921 Melena: Secondary | ICD-10-CM

## 2021-04-28 LAB — CBC WITH DIFFERENTIAL/PLATELET
Abs Immature Granulocytes: 0.05 10*3/uL (ref 0.00–0.07)
Basophils Absolute: 0 10*3/uL (ref 0.0–0.1)
Basophils Relative: 0 %
Eosinophils Absolute: 0.1 10*3/uL (ref 0.0–0.5)
Eosinophils Relative: 1 %
HCT: 26.6 % — ABNORMAL LOW (ref 39.0–52.0)
Hemoglobin: 8.3 g/dL — ABNORMAL LOW (ref 13.0–17.0)
Immature Granulocytes: 1 %
Lymphocytes Relative: 18 %
Lymphs Abs: 1.3 10*3/uL (ref 0.7–4.0)
MCH: 29.6 pg (ref 26.0–34.0)
MCHC: 31.2 g/dL (ref 30.0–36.0)
MCV: 95 fL (ref 80.0–100.0)
Monocytes Absolute: 0.6 10*3/uL (ref 0.1–1.0)
Monocytes Relative: 9 %
Neutro Abs: 5.2 10*3/uL (ref 1.7–7.7)
Neutrophils Relative %: 71 %
Platelets: 251 10*3/uL (ref 150–400)
RBC: 2.8 MIL/uL — ABNORMAL LOW (ref 4.22–5.81)
RDW: 16.8 % — ABNORMAL HIGH (ref 11.5–15.5)
WBC: 7.2 10*3/uL (ref 4.0–10.5)
nRBC: 0 % (ref 0.0–0.2)

## 2021-04-28 LAB — SURGICAL PATHOLOGY

## 2021-04-28 LAB — COMPREHENSIVE METABOLIC PANEL
ALT: 12 U/L (ref 0–44)
AST: 14 U/L — ABNORMAL LOW (ref 15–41)
Albumin: 2.1 g/dL — ABNORMAL LOW (ref 3.5–5.0)
Alkaline Phosphatase: 88 U/L (ref 38–126)
Anion gap: 6 (ref 5–15)
BUN: <5 mg/dL — ABNORMAL LOW (ref 8–23)
CO2: 27 mmol/L (ref 22–32)
Calcium: 7.9 mg/dL — ABNORMAL LOW (ref 8.9–10.3)
Chloride: 107 mmol/L (ref 98–111)
Creatinine, Ser: 0.67 mg/dL (ref 0.61–1.24)
GFR, Estimated: >60 mL/min (ref >60)
Glucose, Bld: 125 mg/dL — ABNORMAL HIGH (ref 70–99)
Potassium: 3.5 mmol/L (ref 3.5–5.1)
Sodium: 140 mmol/L (ref 135–145)
Total Bilirubin: 0.6 mg/dL (ref 0.3–1.2)
Total Protein: 5.6 g/dL — ABNORMAL LOW (ref 6.5–8.1)

## 2021-04-28 LAB — GLUCOSE, CAPILLARY
Glucose-Capillary: 127 mg/dL — ABNORMAL HIGH (ref 70–99)
Glucose-Capillary: 108 mg/dL — ABNORMAL HIGH (ref 70–99)
Glucose-Capillary: 94 mg/dL (ref 70–99)
Glucose-Capillary: 109 mg/dL — ABNORMAL HIGH (ref 70–99)
Glucose-Capillary: 121 mg/dL — ABNORMAL HIGH (ref 70–99)

## 2021-04-28 LAB — BRAIN NATRIURETIC PEPTIDE: B Natriuretic Peptide: 217.6 pg/mL — ABNORMAL HIGH (ref 0.0–100.0)

## 2021-04-28 LAB — MAGNESIUM: Magnesium: 1.7 mg/dL (ref 1.7–2.4)

## 2021-04-28 LAB — PROCALCITONIN: Procalcitonin: <0.1 ng/mL

## 2021-04-28 MED ORDER — SODIUM CHLORIDE 0.9% FLUSH
5.0000 mL | Freq: Every day | INTRAVENOUS | 1 refills | Status: AC
  Filled 2021-04-28: qty 140, 14d supply, fill #0

## 2021-04-28 MED ORDER — POTASSIUM CHLORIDE CRYS ER 20 MEQ PO TBCR
40.0000 meq | EXTENDED_RELEASE_TABLET | Freq: Once | ORAL | Status: AC
  Administered 2021-04-28: 40 meq via ORAL
  Filled 2021-04-28: qty 2

## 2021-04-28 NOTE — Progress Notes (Signed)
Discharge paperwork reviewed with pt. Pt verbalized understanding. Pt alert and oriented x4 in no acute distress upon discharge. Pt taken to main entrance, daughter awaiting downstairs to transport pt home. Pt has taken all of his belongings with him.  ?

## 2021-04-28 NOTE — Progress Notes (Signed)
? ?       Daily Rounding Note ? ?04/28/2021, 10:21 AM ? LOS: 6 days  ? ?SUBJECTIVE:   ?Chief complaint:   melena, GIB.  Complicated pancreatitis.    ? ?Feels well.  No drainage from hepatic abscess drain since bag emptied yesterday.  40 mL documented 4/2, zero output yesterday.  Flushes easily. No signif abd pain.  Tolerating solid food.  Stools brown to yellowish,  not melenic or bloody.  Not dizzy ? ?OBJECTIVE:        ? Vital signs in last 24 hours:    ?Temp:  [97.4 ?F (36.3 ?C)-98.8 ?F (37.1 ?C)] 97.9 ?F (36.6 ?C) (04/04 8099) ?Pulse Rate:  [57-79] 79 (04/04 8338) ?Resp:  [17-18] 18 (04/04 2505) ?BP: (115-143)/(68-92) 143/92 (04/04 3976) ?SpO2:  [98 %-100 %] 99 % (04/04 0822) ?Last BM Date : 04/26/21 ?Filed Weights  ? 04/23/21 1356  ?Weight: 113.4 kg  ? ?General: obese, comfortable, not acutely ill looking   ?Heart: RRR ?Chest: no SOB ?Abdomen: soft, NT.  Drain bag empty with scant remnants of serosanguinous fluid  ?Extremities: no CCE ?Neuro/Psych:  calm, pleasant, fully alert and oriented.  Fluid speech.   ? ?Intake/Output from previous day: ?04/03 0701 - 04/04 0700 ?In: -  ?Out: 200 [Urine:200] ? ?Intake/Output this shift: ?No intake/output data recorded. ? ?Lab Results: ?Recent Labs  ?  04/26/21 ?0116 04/27/21 ?0154 04/28/21 ?0326  ?WBC 6.9 8.1 7.2  ?HGB 9.0* 8.5* 8.3*  ?HCT 27.4* 27.0* 26.6*  ?PLT 222 223 251  ? ?BMET ?Recent Labs  ?  04/26/21 ?0116 04/27/21 ?0154 04/28/21 ?0326  ?NA 142 139 140  ?K 3.6 3.8 3.5  ?CL 110 109 107  ?CO2 '27 22 27  '$ ?GLUCOSE 128* 114* 125*  ?BUN <5* <5* <5*  ?CREATININE 0.58* 0.61 0.67  ?CALCIUM 7.7* 7.9* 7.9*  ? ?LFT ?Recent Labs  ?  04/26/21 ?0116 04/27/21 ?0154 04/28/21 ?0326  ?PROT 5.2* 5.5* 5.6*  ?ALBUMIN 2.0* 2.2* 2.1*  ?AST 14* 15 14*  ?ALT '11 11 12  '$ ?ALKPHOS 103 92 88  ?BILITOT 0.4 0.8 0.6  ? ?PT/INR ?No results for input(s): LABPROT, INR in the last 72 hours. ?Hepatitis Panel ?No results for input(s): HEPBSAG, HCVAB,  HEPAIGM, HEPBIGM in the last 72 hours. ? ?Studies/Results: ?No results found. ? ?Scheduled Meds: ? atorvastatin  10 mg Oral Daily  ? carvedilol  25 mg Oral BID  ? clopidogrel  75 mg Oral Daily  ? diltiazem  240 mg Oral q morning  ? feeding supplement  1 Container Oral TID BM  ? sertraline  100 mg Oral QPM  ? sodium chloride flush  5 mL Intracatheter TID  ? ?Continuous Infusions: ? lactated ringers Stopped (04/25/21 1200)  ? ?PRN Meds:.hydrALAZINE, HYDROcodone-acetaminophen, HYDROmorphone (DILAUDID) injection ? ? ?ASSESMENT:  ? ?Blood loss anemia.  Melena, hematochezia, hematemesis.   ?EGD at [facility] 3/28 w gastritis, duodenitis, no active bleeding, no varices.  Then had gross bleeding from perihepatic fluid collection drain placed 3/29, required at least 2 PRBCs.  ?Now w scant blood per perihepatic drain.  Hgb 9.3 .. 8.5 .. 8.3.   MCV normal.  Low iron 21, low iron sats 4%, ferritin 34.  Elevated TIBC.  B12, folate at or above normal. ?04/23/2021 EGD.  Per Dr Jerilynn Mages. Biopsies obtained of granular gastric mucosa with decreased vascular pattern.  Mucosal changes and ulceration on the wall opposite ampulla in D2. Pancreatic, biliary sphincterotomy appeared open. ?04/25/2021 colonoscopy per Dr Loletha Carrow with fair prep, limited  visualization.  No old or fresh blood, no source for bleeding.  5 mm, sessile polyp not removed.  Nonbleeding internal hemorrhoids. ?04/27/2021 VCE.  Images to be read today.   ?  ?Chronic pancreatitis, hx recurrent acute idiopathic pancreatitis, pseudocyst.  Required pancreatic duct sphincterotomy and stent for leak.  Pancreatic pseudoaneurysm.  Previous appendiceal abscess.  S/p gastric band with complication of perigastric abscess.  ?04/2020 laparoscopic removal of gastric band and port, drain placed. Grew  antibiotic resistant Enterobacteriaceae.  ?04/03/2021 MRCP: Changes of chronic pancreatitis, stable dilation pancreatic duct, no suspicious masses.  Fat stranding anterior to the pancreas involving duodenum  improved and likely represent residual inflammation, scarring of previous pancreatitis. ?Last episode of acute pancreatitis 04/14/21 , lipase 433, w hospitalization at [facility]. Imaging showed acute on chronic pancreatitis, cirrhosis, intra-abdominal varices, splenomegaly, ascites, distended gallbladder, perihepatic capsular right fluid collection 24 x 11 cm.  04/21/21 IR drainage of perihepatic collection, fluid grew Klebsiella.  Developed bleeding via drain and RUQ hematoma (?hepatic pseudoaneurysm, liver laceration?). Hematoma aspirated.  Latest CT of 3/31 shows substantial improvement in perihepatic collection. ?  ?Loculated right pleural effusion, right lower lobe consolidation per CT at [facility]. ?  ?Chronic Xarelto (now permanently discontinued), chronic Plavix (now resumed).  For chronic A-fib, mesenteric vascular disease.  03/2020 celiac, SMA stent placement.  Subsequent stent thrombosis but IR recanalized SMA 08/2020.  Duplex ultrasound of mesenteric arteries on 04/08/2021: Somewhat limited by bowel gas.  Segment of SMA with increased velocity, possible focal stenosis. ?  ?Underlying cirrhosis?, not confirmed.  Splenomegaly, ascites.  No varices on recent endoscopies.  On 04/03/2021 MRCP there was mild hepatic steatosis, hepatosplenomegaly and trace perihepatic ascites.  LFTs are normal. ?  ?Wall thickening at ascending colon per CT.  Although stool limited colonic visualization, there was no evidence for colitis or mucosal abnormality on colonoscopy.  04/25/2021 colonoscopy per Dr Loletha Carrow with fair prep, limited visualization.  No old or fresh blood, no source for bleeding.  5 mm, sessile polyp not removed.  Nonbleeding internal hemorrhoids.  ? ? ?PLAN  ? ?  Await interpretation of VCE images.  Hone today.  Has ERCP w DR M set for 5/4.  ?Will need hold of Plavix prior to ERCP per Dr M's rec.   ? ? Will need fup w IR after d/c to address drain.  Suspect may be able to remove soon given lack of drainage.   Fup w Dr Serafina Royals.    ? ? ? ?Benjamin Fox  04/28/2021, 10:21 AM ?Phone [phone]  ?

## 2021-04-28 NOTE — Discharge Summary (Signed)
?                                                                                ? Benjamin Fox VWU:[id number] DOB: 1955-04-11 DOA: 04/22/2021 ? ?PCP: Martinique, Julie M, NP ? ?Admit date: 04/22/2021  Discharge date: 04/28/2021 ? ?Admitted From: Home   Disposition:  Home ? ? ?Recommendations for Outpatient Follow-up:  ? ?Follow up with PCP in 1-2 weeks ? ?PCP Please obtain BMP/CBC, 2 view CXR in 1week,  (see Discharge instructions)  ? ?PCP Please follow up on the following pending results: Close outpatient follow-up with GI, IR, pulmonary, ENT and PCP ? ? ?Home Health: None   ?Equipment/Devices: None  ?Consultations: GI, IR ?Discharge Condition: Stable    ?CODE STATUS: Full    ?Diet Recommendation: Heart Healthy  ? ?CC - ABD pain ?  ? ?Brief history of present illness from the day of admission and additional interim summary   ? ?66 y.o. male with history of  chronic A-fib on Xarelto, chronic mesenteric ischemia status post celiac and SMA stenting and subsequent thrombectomy for occluded stents 08/2020, CAD status post RCA stenting, type 2 diabetes, hypertension, hyperlipidemia, depression, anxiety, OSA on CPAP, status post prior gastric band (now removed), recurrent idiopathic pancreatitis (complicated by previous leak requiring PD stenting and PD sphincterotomy), previous pancreatic pseudoaneurysm, appendiceal abscess,  just recently known to Dr. Rush Landmark, after initial office evaluation on 03/26/2021 for recurrent idiopathic pancreatitis.  Patient has a complicated GI history, with underlying history of chronic atrial fibrillation for which she had been on Xarelto, coronary artery disease status post RCA stents, history of chronic mesenteric ischemia status post celiac and mesenteric stents for which she has been on Plavix, sleep apnea, adult onset diabetes mellitus and hypertension. Had multiple admissions with pancreatitis,  most recent in December 2022.  After recent office consultation plan was for MRI/MRCP, then possible ERCP. ? ?Patient unfortunately was rehospitalized on 04/14/2021 at [facility] [place] with acute abdominal pain nausea and vomiting. Initial lipase was 433, CT of the abdomen pelvis on the day of admission showed mild acute on chronic pancreatitis, evidence of cirrhosis intra-abdominal varices, splenomegaly ascites and a distended gallbladder. ?  ?Repeat CT on 04/19/2021 showed persistent recurrent acute on chronic pancreatitis reactive changes in the duodenal sweep, and interval enlargement of a right hip perihepatic/subcapsular fluid collection measuring 24 x 11 cm, also loculated right pleural effusion and right lower lobe consolidation. ? ?On 04/21/2021 he had a large volume hematemesis and underwent EGD there which showed gastritis and focal duodenitis, no active bleeding and no esophageal or gastric varices. ? ?On the following day he underwent IR drainage of the perihepatic fluid collection and immediately had about 1500 cc of bright red blood output from the drain.  He required 2 units of packed RBCs.  Decision was made to transfer here.  Hemoglobin was 8.5 after 2 units. ? ?  Hospital Course  ? ? ?Abdominal pain patient with history of recurrent idiopathic pancreatitis, bowel ischemia, pancreatic pseudoaneurysm, recent upper GI bleed at [facility] with EGD showing gastritis and focal duodenitis, CT scan at [facility] on 04/19/2021 showing right perihepatic fluid collection s/p IR drainage of fluid collection at [facility] on 04/20/2021 with bloody fluid coming out. ?  ?- Due to his recent upper GI bleed and now bloody drainage from his right upper quadrant perihepatic drain his Plavix and Xarelto are on hold, per IR Dr. Hinda Glatter he discussed his case with patient's primary cardiologist discontinue Xarelto permanently, he has received  2 units of packed RBC at [facility] on 04/22/2021.  Continue to monitor H&H, IV PPI, transfuse as needed to keep hemoglobin around 7.5, IR and GI both on board.  EGD done here on 04/23/2021 unremarkable, colonoscopy done 04/25/2021 except for polyp unremarkable.  Underwent capsule endoscopy on 04/27/2021 show S.bowel non bleeding AVMs, case DW IR and GI, DC on Plavix + PPI, with GI and IR follow up, drain care education provided. ?  ?GI and IR also on board for now right upper quadrant drain fluid is clearing up this could have been spontaneous bleeding which happened upon placement of right upper quadrant drain into subcapsular fluid collection.  Continue to monitor H&H.  For now continue Zosyn and will complete total of 7 days. ?  ?  ?Abnormal finding on EGD done at [facility].  EGD done at outside hospital showing indeterminate left pyriform sinus lesion, may represent eroded mucosa in the setting of dehydration though oropharyngeal malignancy not excluded.  PCP to ensure outpatient ENT follow-up. ?  ?Right lower pleural effusion - likely sympathetic effusion from perihepatic fluid collection, no cough or pleuritic symptoms, likely has compressive atelectasis no pulmonary symptoms, outpatient follow-up by pulmonary postdischarge in 1 to 2 weeks to be arranged by PCP. ?  ?Coronary artery disease -Stable.  Not endorsing any anginal symptoms, aspirin on hold, resume beta-blocker and statin. ?  ?Paroxysmal A-fib [place] vas 2 score of greater than 3.  Half Home dose beta-blocker, calcium channel blocker, Plavix only once GI bleeding has resolved.  No further Xarelto as discussed by IR with patient's primary cardiologist. ?  ?History of chronic mesenteric ischemia, SMA stenting and subsequent thrombectomy of occluded stents in 08/2020.  On statin , resumed Plavix 04/27/2021, Xarelto to be discontinued per IR permanently. ?  ?Abnormal finding on CT scan - -CT done at outside hospital showing wedge-shaped region of  diminished attenuation involving the left kidney, suspicious for pyelonephritis.  Infarction felt less likely, no dysuria. Question if this is infarction.  Defer outpatient PCP guided follow-up and management. ?  ?HTN (hypertension) -continue home regimen. ?  ?Hypokalemia and hypomagnesemia.  Replaced . ?  ?OSA on CPAP - Continue nightly CPAP ?  ?Type 2 diabetes mellitus (HCC)  - A1c 5.8 on 04/15/2021 on labs done at outside facility, continue previous regimen of diet controlled. ? ? ?Discharge diagnosis   ? ? ?Principal Problem: ?  Abdominal pain ?Active Problems: ?  Pleural effusion ?  Abnormal finding ?  Abnormal finding on CT scan ?  Coronary artery disease ?  OSA on CPAP ?  Type 2 diabetes mellitus ([place]) ?  HTN (hypertension) ?  Rectal bleeding ? ? ? ?Discharge instructions   ? ?Discharge Instructions   ? ? Diet - low sodium heart healthy   Complete by: As directed ?  ? Discharge instructions   Complete by: As directed ?  ?  Follow with Primary MD Martinique, Julie M, NP in 7 days, also follow with your Cardiologist, Gastroenterologist, IR physician Dr Serafina Royals and the recommended Pulmonologist in 1-2 weeks.  ? ?Get CBC, CMP, 2 view Chest X ray -  checked next visit within 1 week by Primary MD  ? ?Activity: As tolerated with Full fall precautions use walker/cane & assistance as needed ? ?Disposition Home  ? ?Diet: Heart Healthy  ? ?Special Instructions: If you have smoked or chewed Tobacco  in the last 2 yrs please stop smoking, stop any regular Alcohol  and or any Recreational drug use. ? ?On your next visit with your primary care physician please Get Medicines reviewed and adjusted. ? ?Please request your Prim.MD to go over all Hospital Tests and Procedure/Radiological results at the follow up, please get all Hospital records sent to your Prim MD by signing hospital release before you go home. ? ?If you experience worsening of your admission symptoms, develop shortness of breath, life threatening emergency, suicidal  or homicidal thoughts you must seek medical attention immediately by calling 911 or calling your MD immediately  if symptoms less severe. ? ?You Must read complete instructions/literature along with all the

## 2021-04-29 ENCOUNTER — Telehealth: Payer: Self-pay | Admitting: Gastroenterology

## 2021-04-29 ENCOUNTER — Encounter: Payer: Self-pay | Admitting: Gastroenterology

## 2021-04-29 ENCOUNTER — Other Ambulatory Visit: Payer: Self-pay | Admitting: Gastroenterology

## 2021-04-29 DIAGNOSIS — K651 Peritoneal abscess: Secondary | ICD-10-CM

## 2021-04-29 DIAGNOSIS — K859 Acute pancreatitis without necrosis or infection, unspecified: Secondary | ICD-10-CM

## 2021-04-29 NOTE — Telephone Encounter (Signed)
Patient called states he just had a procedure at the hospital and they advised him to follow up in the office within a week Dr. Donneta Romberg next available is in May call patient and advise. ?

## 2021-04-29 NOTE — Telephone Encounter (Signed)
Returned patient call & he is aware that he does not need to follow up with Dr. Rush Landmark since he has a procedure scheduled with him in May. ?

## 2021-05-06 ENCOUNTER — Other Ambulatory Visit (HOSPITAL_COMMUNITY): Payer: Self-pay

## 2021-05-07 ENCOUNTER — Encounter: Payer: Self-pay | Admitting: *Deleted

## 2021-05-07 ENCOUNTER — Ambulatory Visit
Admission: RE | Admit: 2021-05-07 | Discharge: 2021-05-07 | Disposition: A | Discharge status: Home / Self Care | Payer: BC Managed Care – PPO | Source: Ambulatory Visit | Attending: Student | Admitting: Student

## 2021-05-07 ENCOUNTER — Ambulatory Visit
Admission: RE | Admit: 2021-05-07 | Discharge: 2021-05-07 | Disposition: A | Discharge status: Home / Self Care | Payer: BC Managed Care – PPO | Source: Ambulatory Visit | Attending: Gastroenterology | Admitting: Gastroenterology

## 2021-05-07 DIAGNOSIS — K651 Peritoneal abscess: Secondary | ICD-10-CM

## 2021-05-07 DIAGNOSIS — K859 Acute pancreatitis without necrosis or infection, unspecified: Secondary | ICD-10-CM

## 2021-05-07 HISTORY — PX: IR RADIOLOGIST EVAL & MGMT: IMG5224

## 2021-05-07 MED ORDER — IOPAMIDOL (ISOVUE-300) INJECTION 61%
100.0000 mL | Freq: Once | INTRAVENOUS | Status: AC | PRN
  Administered 2021-05-07: 100 mL via INTRAVENOUS

## 2021-05-07 NOTE — Progress Notes (Signed)
? ?Reason for follow up:  Drain care ? ?History of present illness: ?66 year old male with complex past medical history, described in my recent clinic note from 04/08/21. ? ?He presented to OSH with abdominal pain and was found to have a large hepatic subcapsular fluid collection found to represent hematoma after drain placement.  He was transferred to [facility] and requested our IR team manage his drain. ? ?Output slowed significantly while inpatient until discharged on 04/28/21.  Since that time, he has flushed the drain daily at home, which has yielded approximately 5-10 mL per day of translucent, light tan fluid. ? ?He denies pain, fevers, chills, nausea, vomiting. ? ?Past Medical History:  ?Diagnosis Date  ? Anemia of chronic disease   ? Chronic atrial fibrillation (HCC)   ? Chronic mesenteric ischemia ([place])   ? Coronary artery disease   ? Hyperlipidemia associated with type 2 diabetes mellitus ([place])   ? Hypertension associated with diabetes ([place])   ? OSA (obstructive sleep apnea)   ? Type 2 diabetes mellitus ([place])   ? ? ?Past Surgical History:  ?Procedure Laterality Date  ? BIOPSY  04/23/2021  ? Procedure: BIOPSY;  Surgeon: Irving Copas., MD;  Location: [place];  Service: Gastroenterology;;  ? COLONOSCOPY WITH PROPOFOL N/A 04/25/2021  ? Procedure: COLONOSCOPY WITH PROPOFOL;  Surgeon: Doran Stabler, MD;  Location: [place];  Service: Gastroenterology;  Laterality: N/A;  ? ESOPHAGOGASTRODUODENOSCOPY (EGD) WITH PROPOFOL N/A 04/23/2021  ? Procedure: ESOPHAGOGASTRODUODENOSCOPY (EGD) WITH PROPOFOL;  Surgeon: Rush Landmark Telford Nab., MD;  Location: [place];  Service: Gastroenterology;  Laterality: N/A;  ? FLEXIBLE SIGMOIDOSCOPY N/A 04/23/2021  ? Procedure: FLEXIBLE SIGMOIDOSCOPY;  Surgeon: Irving Copas., MD;  Location: [place];  Service: Gastroenterology;  Laterality: N/A;  ? GIVENS CAPSULE STUDY N/A 04/27/2021  ? Procedure: GIVENS CAPSULE STUDY;  Surgeon: Doran Stabler, MD;   Location: [place];  Service: Gastroenterology;  Laterality: N/A;  ? IR AORTAGRAM ABDOMINAL SERIALOGRAM  09/15/2020  ? IR RADIOLOGIST EVAL & MGMT  07/25/2020  ? IR RADIOLOGIST EVAL & MGMT  08/28/2020  ? IR RADIOLOGIST EVAL & MGMT  10/22/2020  ? IR RADIOLOGIST EVAL & MGMT  01/08/2021  ? IR RADIOLOGIST EVAL & MGMT  04/08/2021  ? IR THROMBECT PRIM MECH INIT (INCLU) MOD SED  09/15/2020  ? IR TRANSCATH PLC STENT 1ST ART NOT LE CV CAR VERT CAR  09/15/2020  ? IR US GUIDE VASC ACCESS RIGHT  09/15/2020  ? ? ?Allergies: ?Patient has no known allergies. ? ?Medications: ?Prior to Admission medications   ?Medication Sig Start Date End Date Taking? Authorizing Provider  ?acetaminophen (TYLENOL) 500 MG tablet Take 2 tablets (1,000 mg total) by mouth every 6 (six) hours as needed for mild pain (or Fever >/= 101). 09/12/20   Saverio Danker, PA-C  ?atorvastatin (LIPITOR) 10 MG tablet Take 1 tablet (10 mg total) by mouth daily. 01/21/21   Barb Merino, MD  ?carvedilol (COREG) 25 MG tablet Take 25 mg by mouth 2 (two) times daily. 10/31/20   [provider]  ?clopidogrel (PLAVIX) 75 MG tablet Take 1 tablet (75 mg total) by mouth daily. 09/17/20   Theresa Duty, NP  ?Coenzyme Q10 100 MG capsule Take 100 mg by mouth every morning.    [provider]  ?Cyanocobalamin 5000 MCG SUBL Place 5,000 mcg under the tongue every morning. 10/28/15   [provider]  ?diltiazem (CARDIZEM CD) 240 MG 24 hr capsule Take 240 mg by mouth every morning.  07/20/20   [provider]  ?fenofibrate (TRICOR) 145 MG tablet Take 145 mg by mouth every morning. 11/06/20   [provider]  ?furosemide (LASIX) 20 MG tablet Take 20 mg by mouth daily. 03/30/21   [provider]  ?lisinopril (ZESTRIL) 30 MG tablet Take 30 mg by mouth 2 (two) times daily. 07/16/20   [provider]  ?metFORMIN (GLUCOPHAGE) 500 MG tablet Take 500 mg by mouth 2 (two) times daily. 01/12/21   [provider]  ?oxyCODONE (OXY  IR/ROXICODONE) 5 MG immediate release tablet Take 1 tablet (5 mg total) by mouth every 4 (four) hours as needed for moderate pain. 01/21/21   Barb Merino, MD  ?pantoprazole (PROTONIX) 40 MG tablet Take 40 mg by mouth daily. 04/12/20   [provider]  ?sertraline (ZOLOFT) 100 MG tablet Take 100 mg by mouth every evening. 08/05/20   [provider]  ?sodium chloride flush (NS) 0.9 % SOLN Use 67m in catheter daily for 14 days. Flush the right upper abdominal drain with 5 mL of normal saline every day. 04/28/21 05/12/21  Han, Aimee H, PA-C  ?  ? ?Family History  ?Problem Relation Age of Onset  ? Hypertension Father   ? Heart failure Father   ? Colon cancer Neg Hx   ? Esophageal cancer Neg Hx   ? Inflammatory bowel disease Neg Hx   ? Liver disease Neg Hx   ? Pancreatic cancer Neg Hx   ? Rectal cancer Neg Hx   ? Stomach cancer Neg Hx   ? ? ?Social History  ? ?Socioeconomic History  ? Marital status: Married  ?  Spouse name: Not on file  ? Number of children: Not on file  ? Years of education: Not on file  ? Highest education level: Not on file  ?Occupational History  ? Not on file  ?Tobacco Use  ? Smoking status: Former  ?  Types: Cigarettes  ? Smokeless tobacco: Never  ?Vaping Use  ? Vaping Use: Never used  ?Substance and Sexual Activity  ? Alcohol use: Not Currently  ? Drug use: Never  ? Sexual activity: Not on file  ?Other Topics Concern  ? Not on file  ?Social History Narrative  ? Not on file  ? ?Social Determinants of Health  ? ?Financial Resource Strain: Not on file  ?Food Insecurity: Not on file  ?Transportation Needs: Not on file  ?Physical Activity: Not on file  ?Stress: Not on file  ?Social Connections: Not on file  ? ? ? ?Vital Signs: ?There were no vitals taken for this visit. ? ?Physical Exam ?Constitutional:   ?   General: He is not in acute distress. ?HENT:  ?   Mouth/Throat:  ?   Mouth: Mucous membranes are moist.  ?Eyes:  ?   General: No scleral icterus. ?Cardiovascular:  ?   Rate and  Rhythm: Normal rate and regular rhythm.  ?   Heart sounds: Normal heart sounds.  ?Pulmonary:  ?   Breath sounds: Normal breath sounds.  ?Abdominal:  ?   General: There is no distension.  ?   Comments: RUQ drain in place, dressing clean, dry, and intact.  Trace translucent, light tan fluid in bag.  ?Musculoskeletal:  ?   Right lower leg: No edema.  ?   Left lower leg: No edema.  ?Skin: ?   General: Skin is warm and dry.  ?Neurological:  ?   Mental Status: He is alert and oriented to person, place, and  time.  ? ? ?Imaging: ?CT AP 05/07/21 ? ?1. Interval decreased size of right lateral subcapsular hepatic ?fluid collection with unchanged position of indwelling pigtail ?drainage catheter. ?2. Interval decreased size of small subhepatic fluid collection ?which measures up to 3.6 cm, previously 5.3 cm, possibly ?representing a small abscess that formed due to prior pancreatitis ?and resulted in hepatic capsular inflammation that eventually cause ?spontaneous hemorrhage. ?3. Similar appearing pancreatic calcifications in keeping with ?history of chronic pancreatitis. ?4. Decreased size of right pleural effusion, now trace, with ?concomitant resolution of previously visualized right basilar ?atelectasis.  ? ?Labs: ? ?CBC: ?Recent Labs  ?  04/25/21 ?0107 04/26/21 ?0116 04/27/21 ?0154 04/28/21 ?0326  ?WBC 7.7 6.9 8.1 7.2  ?HGB 8.2* 9.0* 8.5* 8.3*  ?HCT 25.7* 27.4* 27.0* 26.6*  ?PLT 221 222 223 251  ? ? ?COAGS: ?Recent Labs  ?  09/15/20 ?0630 04/23/21 ?0305  ?INR 1.0 1.2  ? ? ?BMP: ?Recent Labs  ?  04/25/21 ?0107 04/26/21 ?0116 04/27/21 ?0154 04/28/21 ?0326  ?NA 142 142 139 140  ?K 3.2* 3.6 3.8 3.5  ?CL 107 110 109 107  ?CO2 '26 27 22 27  '$ ?GLUCOSE 102* 128* 114* 125*  ?BUN <5* <5* <5* <5*  ?CALCIUM 7.8* 7.7* 7.9* 7.9*  ?CREATININE 0.57* 0.58* 0.61 0.67  ?GFRNONAA >60 >60 >60 >60  ? ? ?LIVER FUNCTION TESTS: ?Recent Labs  ?  04/25/21 ?0107 04/26/21 ?0116 04/27/21 ?0154 04/28/21 ?0326  ?BILITOT 1.0 0.4 0.8 0.6  ?AST 18 14* 15 14*   ?ALT '12 11 11 12  '$ ?ALKPHOS 109 103 92 88  ?PROT 5.3* 5.2* 5.5* 5.6*  ?ALBUMIN 2.0* 2.0* 2.2* 2.1*  ? ? ?Assessment and Plan: ?66 year old male with spontaneous subcapsular hepatic hematoma, likely secondary to peri

## 2021-05-18 ENCOUNTER — Telehealth: Payer: Self-pay | Admitting: Gastroenterology

## 2021-05-18 ENCOUNTER — Encounter: Payer: Self-pay | Admitting: Gastroenterology

## 2021-05-18 NOTE — Telephone Encounter (Signed)
The pt appt has been cancelled per his preference.  He will call back to reschedule when he is ready. ?

## 2021-05-18 NOTE — Telephone Encounter (Signed)
Patient called to cancel procedure scheduled 06/07/21 at [facility]. Per patient, will call back to reschedule.  ?

## 2021-05-28 ENCOUNTER — Encounter (HOSPITAL_COMMUNITY): Payer: Self-pay

## 2021-05-28 ENCOUNTER — Ambulatory Visit (HOSPITAL_COMMUNITY): Admit: 2021-05-28 | Payer: BC Managed Care – PPO | Admitting: Gastroenterology

## 2021-05-28 SURGERY — UPPER ENDOSCOPIC ULTRASOUND (EUS) RADIAL
Anesthesia: Monitor Anesthesia Care

## 2021-07-03 ENCOUNTER — Other Ambulatory Visit: Payer: Self-pay | Admitting: Interventional Radiology

## 2021-07-03 DIAGNOSIS — K551 Chronic vascular disorders of intestine: Secondary | ICD-10-CM

## 2021-08-03 ENCOUNTER — Ambulatory Visit (INDEPENDENT_AMBULATORY_CARE_PROVIDER_SITE_OTHER): Payer: BC Managed Care – PPO

## 2021-08-03 DIAGNOSIS — K551 Chronic vascular disorders of intestine: Secondary | ICD-10-CM | POA: Diagnosis not present

## 2021-08-03 LAB — I-STAT CREATININE (MANUAL ENTRY): Creatinine, Ser: 0.9 (ref 0.50–1.10)

## 2021-08-03 MED ORDER — IOHEXOL 350 MG/ML SOLN
100.0000 mL | Freq: Once | INTRAVENOUS | Status: AC | PRN
  Administered 2021-08-03: 100 mL via INTRAVENOUS

## 2021-08-05 ENCOUNTER — Encounter: Payer: Self-pay | Admitting: *Deleted

## 2021-08-05 ENCOUNTER — Ambulatory Visit
Admission: RE | Admit: 2021-08-05 | Discharge: 2021-08-05 | Disposition: A | Discharge status: Home / Self Care | Payer: BC Managed Care – PPO | Source: Ambulatory Visit | Attending: Interventional Radiology | Admitting: Interventional Radiology

## 2021-08-05 DIAGNOSIS — K551 Chronic vascular disorders of intestine: Secondary | ICD-10-CM

## 2021-08-05 HISTORY — PX: IR RADIOLOGIST EVAL & MGMT: IMG5224

## 2021-08-05 NOTE — Progress Notes (Signed)
Reason for follow up: The patient is seen in follow up today s/p SMA stent recanalization on 09/15/20 - he presents via virtual telephone visit   History of present illness: "66 year old male with history of chronic mesenteric ischemia status post celiac and superior mesenteric artery stent placement in March 2022 at outside facility. 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 These complications resolved spontaneously however the patient's abdominal pain persisted in the patient developed acute complicated appendicitis, therefore underwent SMA stent recanalization with laser atherectomy and covered stent relining on 09/15/2020. "  In early April, 2023, he presented to OSH with abdominal pain and was found to have a large hepatic subcapsular fluid collection found to represent hematoma after drain placement.  He was transferred to [facility] and requested our IR team manage his drain.  This was removed on 05/07/21 in clinic.  He underwent CTA abdomen/pelvis on 08/03/21 which is detailed below.   Overall he is doing very well and states that he hasn't felt this good in a very long time.  He does have an occasional twinge of stomach pain but these are transient and not severe.  He has a good appetite.  No fevers, chills, nausea, vomiting, or change in stool characteristics.  He remains on Plavix, without bleeding issues.      Past Medical History:  Diagnosis Date   Anemia of chronic disease    Chronic atrial fibrillation (HCC)    Chronic mesenteric ischemia (HCC)    Coronary artery disease    Hyperlipidemia associated with type 2 diabetes mellitus (HCC)    Hypertension associated with diabetes (HCC)    OSA (obstructive sleep apnea)    Type 2 diabetes mellitus ([place])     Past Surgical History:  Procedure Laterality Date    BIOPSY  04/23/2021   Procedure: BIOPSY;  Surgeon: Irving Copas., MD;  Location: [facility] ENDOSCOPY;  Service: Gastroenterology;;   COLONOSCOPY WITH PROPOFOL N/A 04/25/2021   Procedure: COLONOSCOPY WITH PROPOFOL;  Surgeon: Doran Stabler, MD;  Location: [place];  Service: Gastroenterology;  Laterality: N/A;   ESOPHAGOGASTRODUODENOSCOPY (EGD) WITH PROPOFOL N/A 04/23/2021   Procedure: ESOPHAGOGASTRODUODENOSCOPY (EGD) WITH PROPOFOL;  Surgeon: Rush Landmark Telford Nab., MD;  Location: [place];  Service: Gastroenterology;  Laterality: N/A;   FLEXIBLE SIGMOIDOSCOPY N/A 04/23/2021   Procedure: FLEXIBLE SIGMOIDOSCOPY;  Surgeon: Rush Landmark Telford Nab., MD;  Location: [place];  Service: Gastroenterology;  Laterality: N/A;   GIVENS CAPSULE STUDY N/A 04/27/2021   Procedure: GIVENS CAPSULE STUDY;  Surgeon: Doran Stabler, MD;  Location: [place];  Service: Gastroenterology;  Laterality: N/A;   IR AORTAGRAM ABDOMINAL SERIALOGRAM  09/15/2020   IR RADIOLOGIST EVAL & MGMT  07/25/2020   IR RADIOLOGIST EVAL & MGMT  08/28/2020   IR RADIOLOGIST EVAL & MGMT  10/22/2020   IR RADIOLOGIST EVAL & MGMT  01/08/2021   IR RADIOLOGIST EVAL & MGMT  04/08/2021   IR RADIOLOGIST EVAL & MGMT  05/07/2021   IR THROMBECT PRIM MECH INIT (INCLU) MOD SED  09/15/2020   IR TRANSCATH PLC STENT 1ST ART NOT LE CV CAR VERT CAR  09/15/2020   IR US GUIDE VASC ACCESS RIGHT  09/15/2020    Allergies: Patient has no known allergies.  Medications: Prior to Admission medications   Medication Sig Start Date End Date Taking? Authorizing Provider  acetaminophen (TYLENOL) 500 MG tablet Take 2 tablets (1,000 mg total) by  mouth every 6 (six) hours as needed for mild pain (or Fever >/= 101). 09/12/20   Saverio Danker, PA-C  atorvastatin (LIPITOR) 10 MG tablet Take 1 tablet (10 mg total) by mouth daily. 01/21/21   Barb Merino, MD  carvedilol (COREG) 25 MG tablet Take 25 mg by mouth 2 (two) times daily. 10/31/20   [provider]   clopidogrel (PLAVIX) 75 MG tablet Take 1 tablet (75 mg total) by mouth daily. 09/17/20   Theresa Duty, NP  Coenzyme Q10 100 MG capsule Take 100 mg by mouth every morning.    [provider]  Cyanocobalamin 5000 MCG SUBL Place 5,000 mcg under the tongue every morning. 10/28/15   [provider]  diltiazem (CARDIZEM CD) 240 MG 24 hr capsule Take 240 mg by mouth every morning. 07/20/20   [provider]  fenofibrate (TRICOR) 145 MG tablet Take 145 mg by mouth every morning. 11/06/20   [provider]  furosemide (LASIX) 20 MG tablet Take 20 mg by mouth daily. 03/30/21   [provider]  lisinopril (ZESTRIL) 30 MG tablet Take 30 mg by mouth 2 (two) times daily. 07/16/20   [provider]  metFORMIN (GLUCOPHAGE) 500 MG tablet Take 500 mg by mouth 2 (two) times daily. 01/12/21   [provider]  oxyCODONE (OXY IR/ROXICODONE) 5 MG immediate release tablet Take 1 tablet (5 mg total) by mouth every 4 (four) hours as needed for moderate pain. 01/21/21   Barb Merino, MD  pantoprazole (PROTONIX) 40 MG tablet Take 40 mg by mouth daily. 04/12/20   [provider]  sertraline (ZOLOFT) 100 MG tablet Take 100 mg by mouth every evening. 08/05/20   [provider]     Family History  Problem Relation Age of Onset   Hypertension Father    Heart failure Father    Colon cancer Neg Hx    Esophageal cancer Neg Hx    Inflammatory bowel disease Neg Hx    Liver disease Neg Hx    Pancreatic cancer Neg Hx    Rectal cancer Neg Hx    Stomach cancer Neg Hx     Social History   Socioeconomic History   Marital status: Married    Spouse name: Not on file   Number of children: Not on file   Years of education: Not on file   Highest education level: Not on file  Occupational History   Not on file  Tobacco Use   Smoking status: Former    Types: Cigarettes   Smokeless tobacco: Never  Vaping Use   Vaping Use: Never used  Substance  and Sexual Activity   Alcohol use: Not Currently   Drug use: Never   Sexual activity: Not on file  Other Topics Concern   Not on file  Social History Narrative   Not on file   Social Determinants of Health   Financial Resource Strain: Not on file  Food Insecurity: Not on file  Transportation Needs: Not on file  Physical Activity: Not on file  Stress: Not on file  Social Connections: Not on file     Vital Signs: There were no vitals taken for this visit.  No physical examination was performed in lieu of virtual telephone clinic visit.   Imaging: CT Angio Abd/Pel w/ and/or w/o  Result Date: 08/04/2021 CLINICAL DATA:  66 year old male with history of recurrent pancreatitis, chronic mesenteric ischemia status post recanalization of occluded SMA stent on 09/15/2020, and most recently development of subcapsular  hepatic hematoma status post drain placement, drain removed on 05/07/2021. EXAM: CTA ABDOMEN AND PELVIS WITHOUT AND WITH CONTRAST TECHNIQUE: Multidetector CT imaging of the abdomen and pelvis was performed using the standard protocol during bolus administration of intravenous contrast. Multiplanar reconstructed images and MIPs were obtained and reviewed to evaluate the vascular anatomy. RADIATION DOSE REDUCTION: This exam was performed according to the departmental dose-optimization program which includes automated exposure control, adjustment of the mA and/or kV according to patient size and/or use of iterative reconstruction technique. CONTRAST:  174m OMNIPAQUE IOHEXOL 350 MG/ML SOLN COMPARISON:  05/07/2021, 01/16/2021, 09/15/2020 FINDINGS: VASCULAR Aorta: Normal caliber aorta without aneurysm, dissection, vasculitis or significant stenosis. Infrarenal circumferential atherosclerotic calcifications Celiac: Unchanged occluded indwelling stent. Collateralized distal reconstitution within the celiac distribution predominantly via the gastroduodenal artery and arc of [place]. SMA:  Unchanged position and patency of indwelling SMA stent. The distal SMA is patent. Renals: Dual right and single left renal arteries. Similar appearing moderate ostial stenosis of the main bilateral renal artery secondary to atherosclerotic plaque. IMA: Mild ostial stenosis secondary atherosclerotic plaque. Patent distally. Inflow: Patent without evidence of aneurysm, dissection, vasculitis or significant stenosis. Scattered atherosclerotic calcifications. Proximal Outflow: Bilateral common femoral and visualized portions of the superficial and profunda femoral arteries are patent without evidence of aneurysm, dissection, vasculitis or significant stenosis. Scattered atherosclerotic calcifications. Veins: The hepatic veins are poorly opacified. The portal system is widely patent and normal in caliber. The renal veins are patent bilaterally in standard anatomic configuration. No evidence of iliocaval thrombosis or anomaly. Review of the MIP images confirms the above findings. NON-VASCULAR Lower chest: Interval resolution of previously visualized trace right pleural effusion. Unchanged calcified granuloma in the right lung base. Unchanged mild biatrial cardiomegaly. No pericardial effusion. Hepatobiliary: No focal liver abnormality is seen. Complete resolution of previously visualized subcapsular hematoma. No gallstones, gallbladder wall thickening, or biliary dilatation. Pancreas: Again seen are multifocal pancreatic calcifications extending from the head through the tail. No evidence of significant pancreatic ductal dilation or evidence of focal mass. No surrounding inflammatory changes. Spleen: Normal in size again seen are multifocal punctate calcified granulomas. Adrenals/Urinary Tract: Adrenal glands are unremarkable. Unchanged bilateral simple renal cysts. Kidneys are normal, without renal calculi, focal lesion, or hydronephrosis. Bladder is unremarkable. Stomach/Bowel: Stomach is within normal limits. Appendix  appears normal. No evidence of bowel wall thickening, distention, or inflammatory changes. Lymphatic: No abdominopelvic lymphadenopathy. Reproductive: Prostate is unremarkable. Other: No abdominal wall hernia or abnormality. No abdominopelvic ascites. Musculoskeletal: No acute osseous abnormality. Similar appearing multilevel degenerative changes of the visualized thoracolumbar spine. IMPRESSION: VASCULAR 1. Patent indwelling superior mesenteric artery ostial stent. No evidence of mesenteric ischemia. 2.  Aortic Atherosclerosis (ICD10-I70.0). NON-VASCULAR 1. No acute abdominopelvic abnormality. 2. Resolution of previously visualized right pleural effusion. 3. Resolution of previously visualized subcapsular hepatic hematomas. 4. Similar appearing multifocal pancreatic calcifications in keeping with history of chronic pancreatitis. 5. Evidence of prior granulomatous disease. DRuthann Cancer MD [facility] Electronically Signed   By: DRuthann CancerM.D.   On: 08/04/2021 11:34    Labs:  CBC: Recent Labs    04/25/21 0107 04/26/21 0116 04/27/21 0154 04/28/21 0326  WBC 7.7 6.9 8.1 7.2  HGB 8.2* 9.0* 8.5* 8.3*  HCT 25.7* 27.4* 27.0* 26.6*  PLT 221 222 223 251    COAGS: Recent Labs    09/15/20 0630 04/23/21 0305  INR 1.0 1.2    BMP: Recent Labs    04/25/21 0107 04/26/21 0116 04/27/21 0154 04/28/21  0326 08/03/21 1148  NA 142 142 139 140  --   K 3.2* 3.6 3.8 3.5  --   CL 107 110 109 107  --   CO2 '26 27 22 27  '$ --   GLUCOSE 102* 128* 114* 125*  --   BUN <5* <5* <5* <5*  --   CALCIUM 7.8* 7.7* 7.9* 7.9*  --   CREATININE 0.57* 0.58* 0.61 0.67 0.90  GFRNONAA >60 >60 >60 >60  --     LIVER FUNCTION TESTS: Recent Labs    04/25/21 0107 04/26/21 0116 04/27/21 0154 04/28/21 0326  BILITOT 1.0 0.4 0.8 0.6  AST 18 14* 15 14*  ALT '12 11 11 12  '$ ALKPHOS 109 103 92 88  PROT 5.3* 5.2* 5.5* 5.6*  ALBUMIN 2.0* 2.0* 2.2* 2.1*     Assessment and Plan: 66 year old male with history of chronic mesenteric ischemia status post celiac and superior mesenteric artery stent placement in March 2022 at outside facility. 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 These complications resolved spontaneously however the patient's abdominal pain persisted in the patient developed acute complicated appendicitis, therefore underwent SMA stent recanalization with laser atherectomy and covered stent relining on 09/15/2020.   Complete resolution of prior hepatic subcapsular hematomas likely secondary to supratherapeutic anticoagulation at the time.  Now off Xarelto and only taking Plavix 75 mg QD.   SMA stent remains patent without clinical signs or symptoms of mesenteric ischemia.   -continue Plavix  -follow up in 6 months with CTA abdomen/pelvis to monitor SMA stent patency  Electronically Signed: Rosanne Ashing Shanavia Makela 08/05/2021, 9:26 AM   I spent a total of 25 Minutes in telephone clinical consultation, greater than 50% of which was counseling/coordinating care for chronic mesenteric ischemia.

## 2022-01-15 ENCOUNTER — Other Ambulatory Visit: Payer: Self-pay | Admitting: Interventional Radiology

## 2022-01-15 DIAGNOSIS — K551 Chronic vascular disorders of intestine: Secondary | ICD-10-CM

## 2022-02-19 ENCOUNTER — Other Ambulatory Visit: Payer: BC Managed Care – PPO

## 2022-02-22 ENCOUNTER — Telehealth: Payer: BC Managed Care – PPO

## 2022-03-01 ENCOUNTER — Ambulatory Visit (INDEPENDENT_AMBULATORY_CARE_PROVIDER_SITE_OTHER): Payer: BC Managed Care – PPO

## 2022-03-01 DIAGNOSIS — K551 Chronic vascular disorders of intestine: Secondary | ICD-10-CM | POA: Diagnosis not present

## 2022-03-01 MED ORDER — IOHEXOL 350 MG/ML SOLN
200.0000 mL | Freq: Once | INTRAVENOUS | Status: AC | PRN
  Administered 2022-03-01: 125 mL via INTRAVENOUS

## 2022-03-04 ENCOUNTER — Other Ambulatory Visit: Payer: BC Managed Care – PPO

## 2022-03-08 ENCOUNTER — Other Ambulatory Visit: Payer: BC Managed Care – PPO

## 2022-03-10 ENCOUNTER — Ambulatory Visit
Admission: RE | Admit: 2022-03-10 | Discharge: 2022-03-10 | Disposition: A | Discharge status: Home / Self Care | Payer: BC Managed Care – PPO | Source: Ambulatory Visit | Attending: Interventional Radiology | Admitting: Interventional Radiology

## 2022-03-10 DIAGNOSIS — K551 Chronic vascular disorders of intestine: Secondary | ICD-10-CM

## 2022-03-10 HISTORY — PX: IR RADIOLOGIST EVAL & MGMT: IMG5224

## 2022-03-10 NOTE — Progress Notes (Signed)
Reason for follow up: The patient is seen in follow up today s/p SMA stent recanalization on 09/15/20 - he presents via virtual telephone visit   History of present illness: "67 year old male with history of chronic mesenteric ischemia status post celiac and superior mesenteric artery stent placement in March 2022 at outside facility. 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 These complications resolved spontaneously however the patient's abdominal pain persisted in the patient developed acute complicated appendicitis, therefore underwent SMA stent recanalization with laser atherectomy and covered stent relining on 09/15/2020. "   In early April, 2023, he presented to OSH with abdominal pain and was found to have a large hepatic subcapsular fluid collection found to represent hematoma after drain placement.  He was transferred to [facility] and requested our IR team manage his drain.  This was removed on 05/07/21 in clinic.   He underwent surveillance CTA abdomen/pelvis on 03/01/22 which is detailed below.   Overall he continues to do very well. No complaints of abdominal pain.  He has a good appetite.  No fevers, chills, nausea, vomiting, or change in stool characteristics.  He remains on Plavix, without bleeding issues.      Past Medical History:  Diagnosis Date   Anemia of chronic disease    Chronic atrial fibrillation (HCC)    Chronic mesenteric ischemia (HCC)    Coronary artery disease    Hyperlipidemia associated with type 2 diabetes mellitus (HCC)    Hypertension associated with diabetes (HCC)    OSA (obstructive sleep apnea)    Type 2 diabetes mellitus ([place])     Past Surgical History:  Procedure Laterality Date   BIOPSY  04/23/2021   Procedure: BIOPSY;  Surgeon: Irving Copas., MD;  Location: [facility]  ENDOSCOPY;  Service: Gastroenterology;;   COLONOSCOPY WITH PROPOFOL N/A 04/25/2021   Procedure: COLONOSCOPY WITH PROPOFOL;  Surgeon: Doran Stabler, MD;  Location: [place];  Service: Gastroenterology;  Laterality: N/A;   ESOPHAGOGASTRODUODENOSCOPY (EGD) WITH PROPOFOL N/A 04/23/2021   Procedure: ESOPHAGOGASTRODUODENOSCOPY (EGD) WITH PROPOFOL;  Surgeon: Rush Landmark Telford Nab., MD;  Location: [place];  Service: Gastroenterology;  Laterality: N/A;   FLEXIBLE SIGMOIDOSCOPY N/A 04/23/2021   Procedure: FLEXIBLE SIGMOIDOSCOPY;  Surgeon: Rush Landmark Telford Nab., MD;  Location: [place];  Service: Gastroenterology;  Laterality: N/A;   GIVENS CAPSULE STUDY N/A 04/27/2021   Procedure: GIVENS CAPSULE STUDY;  Surgeon: Doran Stabler, MD;  Location: [place];  Service: Gastroenterology;  Laterality: N/A;   IR AORTAGRAM ABDOMINAL SERIALOGRAM  09/15/2020   IR RADIOLOGIST EVAL & MGMT  07/25/2020   IR RADIOLOGIST EVAL & MGMT  08/28/2020   IR RADIOLOGIST EVAL & MGMT  10/22/2020   IR RADIOLOGIST EVAL & MGMT  01/08/2021   IR RADIOLOGIST EVAL & MGMT  04/08/2021   IR RADIOLOGIST EVAL & MGMT  05/07/2021   IR RADIOLOGIST EVAL & MGMT  08/05/2021   IR THROMBECT PRIM MECH INIT (INCLU) MOD SED  09/15/2020   IR TRANSCATH PLC STENT 1ST ART NOT LE CV CAR VERT CAR  09/15/2020   IR US GUIDE VASC ACCESS RIGHT  09/15/2020    Allergies: Patient has no known allergies.  Medications: Prior to Admission medications   Medication Sig Start Date End Date Taking? Authorizing Provider  acetaminophen (TYLENOL) 500 MG tablet Take 2 tablets (1,000 mg total) by mouth every 6 (six) hours as needed for mild pain (or Fever >/=  101). 09/12/20   Saverio Danker, PA-C  atorvastatin (LIPITOR) 10 MG tablet Take 1 tablet (10 mg total) by mouth daily. 01/21/21   Barb Merino, MD  carvedilol (COREG) 25 MG tablet Take 25 mg by mouth 2 (two) times daily. 10/31/20   [provider]  clopidogrel (PLAVIX) 75 MG tablet Take 1 tablet  (75 mg total) by mouth daily. 09/17/20   Theresa Duty, NP  Coenzyme Q10 100 MG capsule Take 100 mg by mouth every morning.    [provider]  Cyanocobalamin 5000 MCG SUBL Place 5,000 mcg under the tongue every morning. 10/28/15   [provider]  diltiazem (CARDIZEM CD) 240 MG 24 hr capsule Take 240 mg by mouth every morning. 07/20/20   [provider]  fenofibrate (TRICOR) 145 MG tablet Take 145 mg by mouth every morning. 11/06/20   [provider]  furosemide (LASIX) 20 MG tablet Take 20 mg by mouth daily. 03/30/21   [provider]  lisinopril (ZESTRIL) 30 MG tablet Take 30 mg by mouth 2 (two) times daily. 07/16/20   [provider]  metFORMIN (GLUCOPHAGE) 500 MG tablet Take 500 mg by mouth 2 (two) times daily. 01/12/21   [provider]  oxyCODONE (OXY IR/ROXICODONE) 5 MG immediate release tablet Take 1 tablet (5 mg total) by mouth every 4 (four) hours as needed for moderate pain. 01/21/21   Barb Merino, MD  pantoprazole (PROTONIX) 40 MG tablet Take 40 mg by mouth daily. 04/12/20   [provider]  sertraline (ZOLOFT) 100 MG tablet Take 100 mg by mouth every evening. 08/05/20   [provider]     Family History  Problem Relation Age of Onset   Hypertension Father    Heart failure Father    Colon cancer Neg Hx    Esophageal cancer Neg Hx    Inflammatory bowel disease Neg Hx    Liver disease Neg Hx    Pancreatic cancer Neg Hx    Rectal cancer Neg Hx    Stomach cancer Neg Hx     Social History   Socioeconomic History   Marital status: Married    Spouse name: Not on file   Number of children: Not on file   Years of education: Not on file   Highest education level: Not on file  Occupational History   Not on file  Tobacco Use   Smoking status: Former    Types: Cigarettes   Smokeless tobacco: Never  Vaping Use   Vaping Use: Never used  Substance and Sexual Activity   Alcohol use: Not Currently    Drug use: Never   Sexual activity: Not on file  Other Topics Concern   Not on file  Social History Narrative   Not on file   Social Determinants of Health   Financial Resource Strain: Not on file  Food Insecurity: Not on file  Transportation Needs: Not on file  Physical Activity: Not on file  Stress: Not on file  Social Connections: Not on file     Vital Signs: There were no vitals taken for this visit.  No physical examination was performed in lieu of virtual telephone clinic visit.  Imaging: IMPRESSION: 1. SMA stent remains patent. 2. 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 3. Changes of chronic pancreatitis again seen. 4. Chronic occlusion of the splenic vein. Prominent short gastric and short gastric veins are likely the primary route of splenic drainage. 5. Diffuse bladder wall thickening likely due to under  distension. Chronic cystitis and outlet obstruction can have a similar appearance.   Labs:  CBC: Recent Labs    04/25/21 0107 04/26/21 0116 04/27/21 0154 04/28/21 0326  WBC 7.7 6.9 8.1 7.2  HGB 8.2* 9.0* 8.5* 8.3*  HCT 25.7* 27.4* 27.0* 26.6*  PLT 221 222 223 251    COAGS: Recent Labs    04/23/21 0305  INR 1.2    BMP: Recent Labs    04/25/21 0107 04/26/21 0116 04/27/21 0154 04/28/21 0326 08/03/21 1148  NA 142 142 139 140  --   K 3.2* 3.6 3.8 3.5  --   CL 107 110 109 107  --   CO2 26 27 22 27  $ --   GLUCOSE 102* 128* 114* 125*  --   BUN <5* <5* <5* <5*  --   CALCIUM 7.8* 7.7* 7.9* 7.9*  --   CREATININE 0.57* 0.58* 0.61 0.67 0.90  GFRNONAA >60 >60 >60 >60  --     LIVER FUNCTION TESTS: Recent Labs    04/25/21 0107 04/26/21 0116 04/27/21 0154 04/28/21 0326  BILITOT 1.0 0.4 0.8 0.6  AST 18 14* 15 14*  ALT 12 11 11 12  $ ALKPHOS 109 103 92 88  PROT 5.3* 5.2* 5.5* 5.6*  ALBUMIN 2.0* 2.0* 2.2* 2.1*    Assessment and Plan: Assessment and Plan: 67 year old male with history of chronic mesenteric ischemia status  post celiac and superior mesenteric artery stent placement in March 2022 at outside facility. 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 These complications resolved spontaneously however the patient's abdominal pain persisted in the patient developed acute complicated appendicitis, therefore underwent SMA stent recanalization with laser atherectomy and covered stent relining on 09/15/2020. CTA shows patent stent, 18 months out.   Remains on Plavix 75 mg QD.   SMA stent remains patent without clinical signs or symptoms of mesenteric ischemia.    -continue Plavix  -follow up in 12 months with CTA abdomen/pelvis to monitor SMA stent patency  Electronically Signed: Suzette Battiest 03/10/2022, 2:31 PM   I spent a total of 25 Minutes in virtual telephone clinical consultation, greater than 50% of which was counseling/coordinating care for chronic mesenteric ischemia.

## 2022-11-11 ENCOUNTER — Telehealth: Payer: Self-pay | Admitting: Gastroenterology

## 2022-11-11 NOTE — Telephone Encounter (Addendum)
The pt has recent history of "heart surgery" and has developed abd pain and rectal bleeding. PCP has requested urgent GI appt- referral to be entered   No appts available with Dr Meridee Score   Pt daughter advised that I will check on Monday for 10/28 appt with app.

## 2022-11-11 NOTE — Telephone Encounter (Signed)
Patents daughter Amy called and stated the patient just had a positive hemoccult result and is seeking advise.

## 2022-11-15 NOTE — Telephone Encounter (Signed)
11/22/22 at 1130 am appt made with Quentin Mulling PA

## 2022-11-15 NOTE — Telephone Encounter (Signed)
Left message on machine to call back  

## 2022-11-16 NOTE — Telephone Encounter (Signed)
Patient daughter calling back to confirm apt and says thank you

## 2022-11-16 NOTE — Telephone Encounter (Signed)
Noted  

## 2022-11-16 NOTE — Telephone Encounter (Signed)
Left message on machine to call back  

## 2022-11-16 NOTE — Telephone Encounter (Signed)
Left message on machine to call back   The pt daughter has viewed the appt information in My Chart.

## 2022-11-22 ENCOUNTER — Ambulatory Visit: Payer: BC Managed Care – PPO | Admitting: Physician Assistant

## 2023-04-06 IMAGING — CT CT ANGIO ABDOMEN
2 of 8 series · 11 of 46 positions shown, 15 images · IV contrast (omnipaque)
Comparison: 09/15/2020, 09/10/2020

CLINICAL DATA: 65-year-old male with history of chronic mesenteric
ischemia status post celiac and superior mesenteric artery stent
placement in March 2020 at outside facility. Postprocedural course
was complicated by near immediate stent thrombosis and subsequent
development of pancreatitis complicated by walled-off necrosis that
resulted in pancreaticoduodenal arcade pseudoaneurysm formation.
These complications resolved spontaneously however the patient's
abdominal pain persisted in the patient developed acute complicated
appendicitis, therefore underwent SMA stent recanalization with
laser atherectomy and covered stent relining on 09/15/2020. The
patient presents for short-term follow-up.

EXAM:
CTA ABDOMEN AND PELVIS WITH CONTRAST
TECHNIQUE: Multidetector CT imaging of the abdomen and pelvis was performed
using the standard protocol during bolus administration of
intravenous contrast. Multiplanar reconstructed images and MIPs were
obtained and reviewed to evaluate the vascular anatomy.
CONTRAST:  100mL OMNIPAQUE IOHEXOL 350 MG/ML SOLN

[Series 5: coronals · coronal · 0.87mm/px · 2 of 99 slices shown]
[im 33/99  soft-tissue]
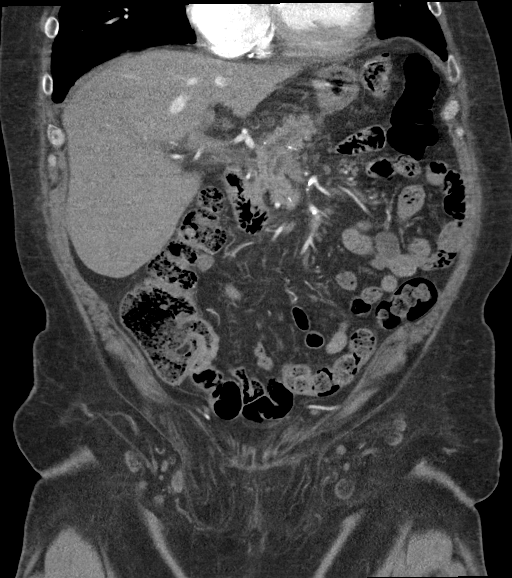
[im 66/99  soft-tissue]
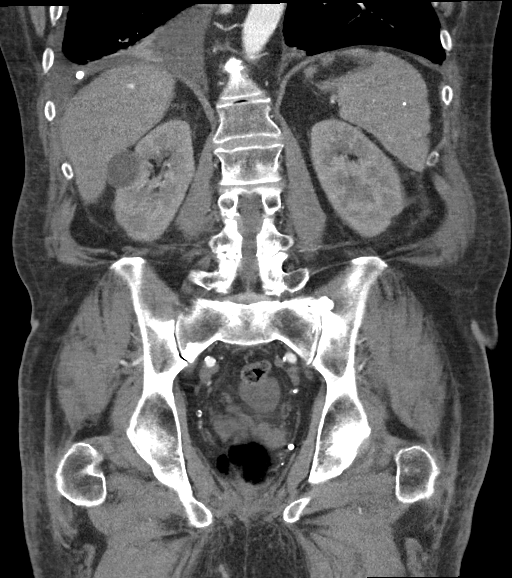

[Series 10: axial venous · axial · portal-venous · 0.85mm/px · z∈[-504,-100]mm · 9 of 100 slices shown, 13 images]
[im 10/100  soft-tissue]
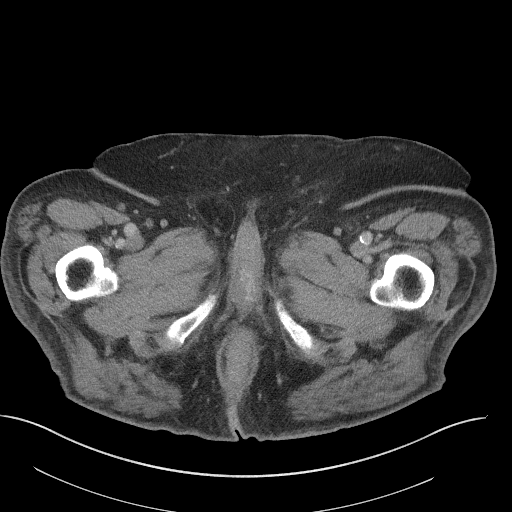
[im 10/100  bone]
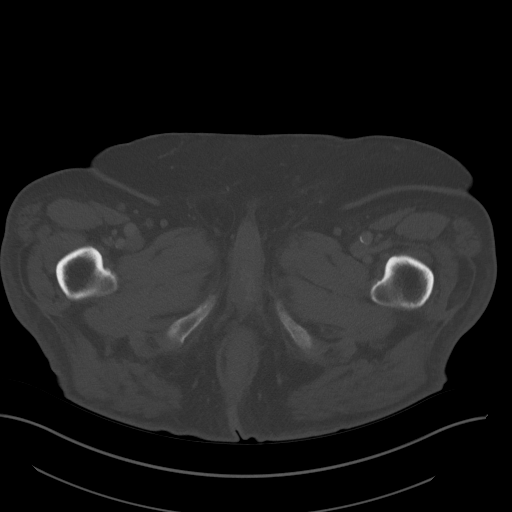
[im 19/100  soft-tissue]
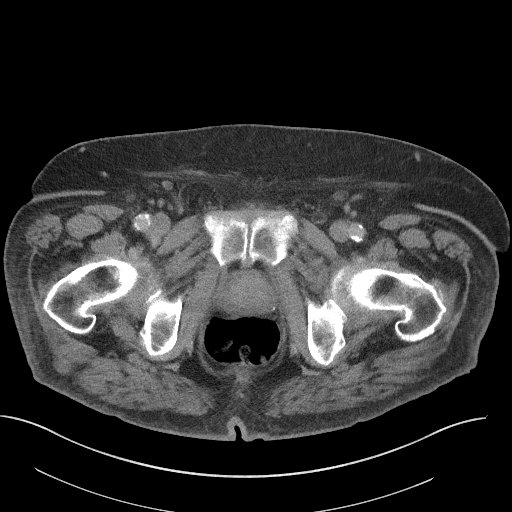
[im 37/100  soft-tissue]
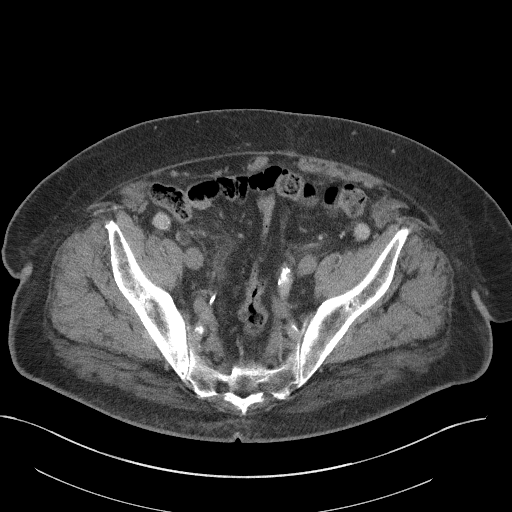
[im 46/100  soft-tissue]
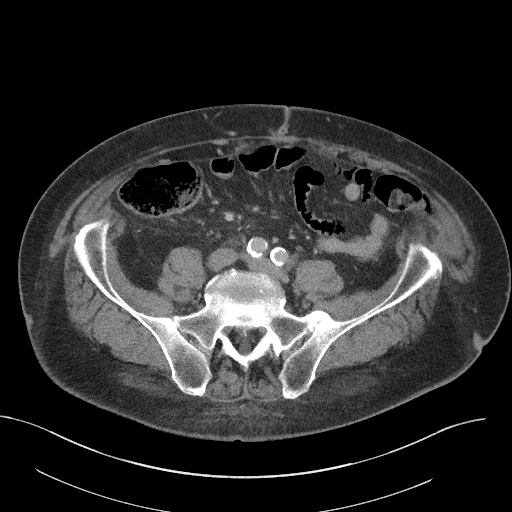
[im 55/100  soft-tissue]
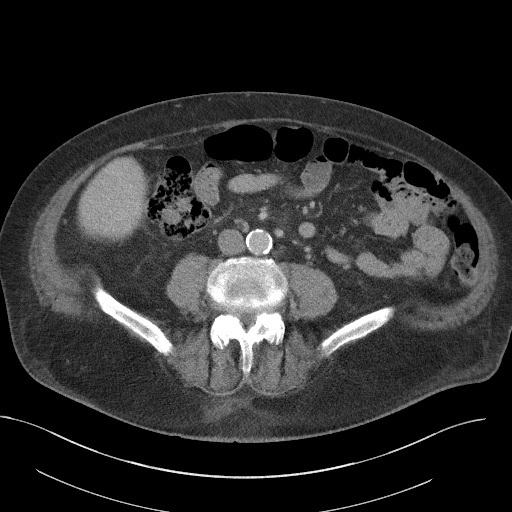
[im 64/100  soft-tissue]
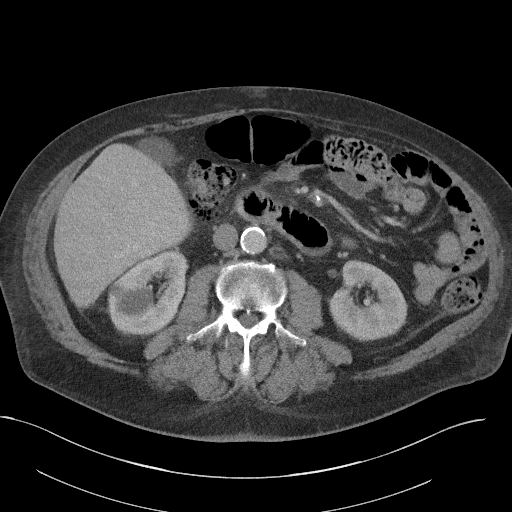
[im 64/100  lung]
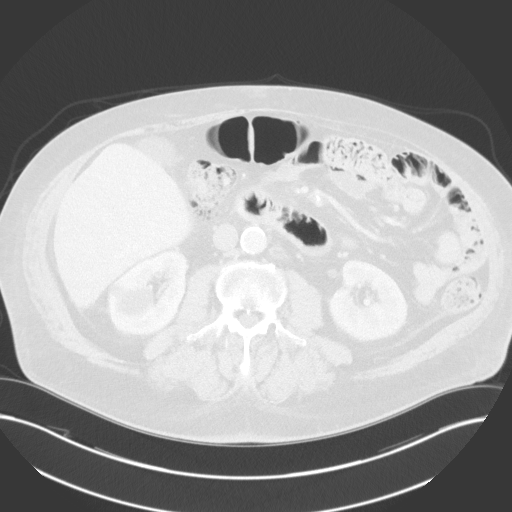
[im 73/100  lung]
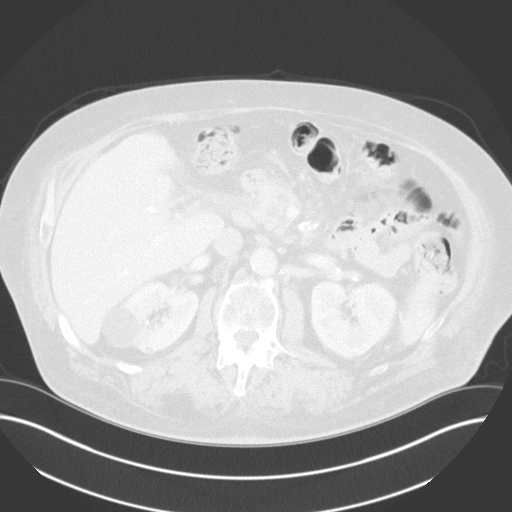
[im 82/100  soft-tissue]
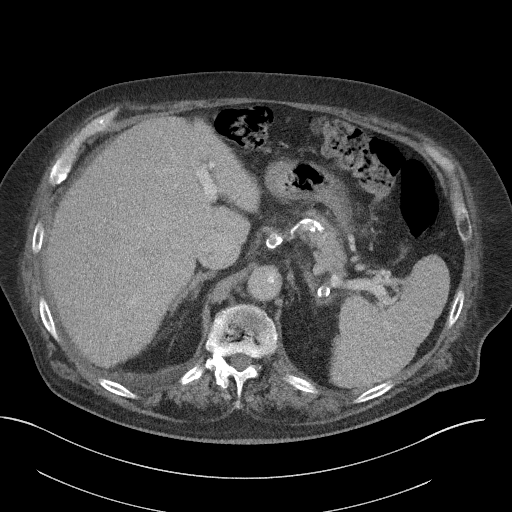
[im 82/100  lung]
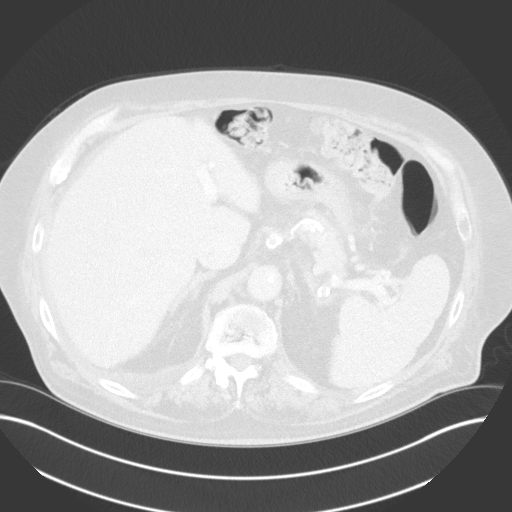
[im 91/100  soft-tissue]
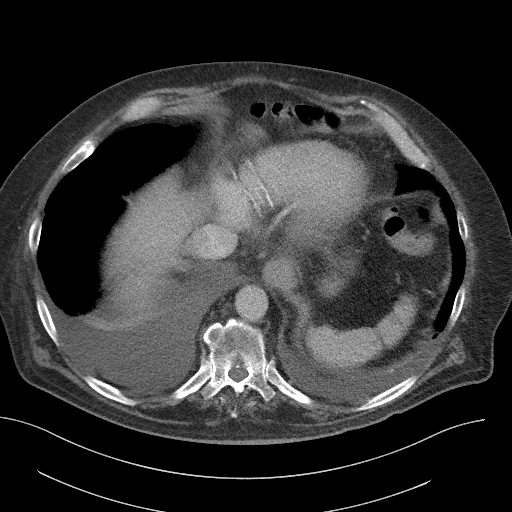
[im 91/100  lung]
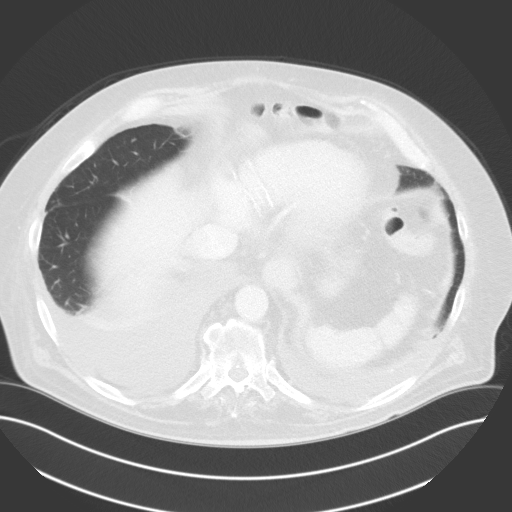

[11 of 46 positions shown; findings below may reference images not displayed]

FINDINGS: VASCULAR

Aorta: Normal caliber and patent throughout. Scattered fibrofatty
and calcific atherosclerotic changes, most prominent in the
infrarenal segment.

Celiac: Indwelling celiac ostial stent remains occluded. There is
distal reconstitution and patency of the splenic and hepatic
arteries.

SMA: Ostial stent in unchanged position which appears patent. The
distal SMA appears patent with scattered atherosclerotic
calcifications. No evidence of aneurysm formation.

Renals: Dual right and single left renal arteries. There is focal
severe stenosis secondary to atherosclerotic plaque of the more
superior right renal artery which is patent distally, unchanged. The
inferior right and the single left renal arteries are patent

IMA: Moderate ostial stenosis secondary to atherosclerotic plaque.
Patent distally.

Inflow: Patent without evidence of aneurysm, dissection, vasculitis
or significant stenosis.

Proximal Outflow: Bilateral common femoral and visualized portions
of the superficial and profunda femoral arteries are patent without
evidence of aneurysm, dissection, vasculitis or significant
stenosis.

Veins: The hepatic veins are widely patent. The portal system is
widely patent and normal in caliber. The renal veins are patent
bilaterally in standard anatomic configuration. No evidence of
iliocaval thrombosis or anomaly.

Review of the MIP images confirms the above findings.

NON-VASCULAR

Lower chest: Interval worsening of small right and trace left
pleural effusions with associated bibasilar dependent subsegmental
atelectasis.

Hepatobiliary: No focal liver abnormality is seen. No gallstones,
gallbladder wall thickening, or biliary dilatation.

Pancreas: Similar-appearing mild central pancreatic ductal dilation.
Scattered punctate calcifications in the pancreatic head neck,
unchanged. No evidence of peripancreatic inflammatory changes or
fluid collections.

Spleen: Normal in size.  Scattered punctate calcified granulomas.

Adrenals/Urinary Tract: Adrenal glands are unremarkable. Unchanged
appearance of bilateral simple renal cysts, the largest about the
right inferior pole measuring up to approximately 3.7 cm. Kidneys
are normal, without renal calculi, focal lesion, or hydronephrosis.
Bladder is unremarkable.

Stomach/Bowel: Stomach is within normal limits. Significant interval
improvement of inflammatory changes about the appendix, now with
mild residual thickening measuring up to approximately 1 cm at the
appendiceal tip. No significant surrounding fluid collections or
abnormal enhancement. Redundant sigmoid colon. No evidence of bowel
wall thickening, distention, or inflammatory changes.

Lymphatic: No abdominopelvic lymphadenopathy.

Reproductive: Prostate is unremarkable.

Other: No abdominal wall hernia or abnormality. No abdominopelvic
ascites.

Musculoskeletal: Similar appearing mild multilevel degenerative
changes of the visualized thoracolumbar spine. No acute osseous
abnormality or aggressive appearing osseous lesion.
IMPRESSION: VASCULAR

1. The indwelling superior mesenteric ostial stent appears patent in
unchanged position.
2. Persistently occluded indwelling celiac ostial stent.
3.  Aortic Atherosclerosis ([id number]-ZC7.7).

NON-VASCULAR

1. Significant interval resolution of previously visualized
complicated appendicitis. No discernible periappendiceal fluid
collection.
2. Interval enlargement of small right and trace left pleural
effusions with associated passive atelectasis.

## 2023-05-04 ENCOUNTER — Other Ambulatory Visit: Payer: Self-pay | Admitting: Interventional Radiology

## 2023-05-04 DIAGNOSIS — K551 Chronic vascular disorders of intestine: Secondary | ICD-10-CM

## 2023-05-16 ENCOUNTER — Ambulatory Visit

## 2023-05-16 DIAGNOSIS — K551 Chronic vascular disorders of intestine: Secondary | ICD-10-CM

## 2023-05-16 MED ORDER — IOHEXOL 350 MG/ML SOLN
200.0000 mL | Freq: Once | INTRAVENOUS | Status: AC | PRN
  Administered 2023-05-16: 125 mL via INTRAVENOUS

## 2023-05-21 NOTE — Progress Notes (Signed)
 This encounter was conducted via the [employer] providing interactive audio and visual communication.  The patient provided verbal consent to conduct a virtual appointment.  The patient was located at their primary residence during this encounter.   Chief Complaint: The patient is seen in virtual video follow up today s/p SMA stent recanalization on 09/15/20   History of present illness: HPI from last clinic visit 03/10/22 "68 year old male with history of chronic mesenteric ischemia status post celiac and superior mesenteric artery stent placement in March 2022 at outside facility. 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 These complications resolved spontaneously however the patient's abdominal pain persisted in the patient developed acute complicated appendicitis, therefore underwent SMA stent recanalization with laser atherectomy and covered stent relining on 09/15/2020. "   In early April, 2023, he presented to OSH with abdominal pain and was found to have a large hepatic subcapsular fluid collection found to represent hematoma after drain placement.  He was transferred to [facility] and requested our IR team manage his drain.  This was removed on 05/07/21 in clinic.   He underwent surveillance CTA abdomen/pelvis on 03/01/22 which is detailed below.   Overall he continues to do very well. No complaints of abdominal pain.  He has a good appetite.  No fevers, chills, nausea, vomiting, or change in stool characteristics.  He remains on Plavix , without bleeding issues.    His CTA abdomen/pelvis 18 months post-procedure showed a patent stent and we discussed repeating a CTA in one year with clinic visit shortly thereafter. His CTA was completed 05/16/23. He presents today via virtual video visit for follow up.   Past  Medical History:  Diagnosis Date   Anemia of chronic disease    Chronic atrial fibrillation (HCC)    Chronic mesenteric ischemia (HCC)    Coronary artery disease    Hyperlipidemia associated with type 2 diabetes mellitus (HCC)    Hypertension associated with diabetes (HCC)    OSA (obstructive sleep apnea)    Type 2 diabetes mellitus (HCC)     Past Surgical History:  Procedure Laterality Date   BIOPSY  04/23/2021   Procedure: BIOPSY;  Surgeon: Normie Becton., MD;  Location: [facility] ENDOSCOPY;  Service: Gastroenterology;;   COLONOSCOPY WITH PROPOFOL  N/A 04/25/2021   Procedure: COLONOSCOPY WITH PROPOFOL ;  Surgeon: Albertina Hugger, MD;  Location: [facility] ENDOSCOPY;  Service: Gastroenterology;  Laterality: N/A;   ESOPHAGOGASTRODUODENOSCOPY (EGD) WITH PROPOFOL  N/A 04/23/2021   Procedure: ESOPHAGOGASTRODUODENOSCOPY (EGD) WITH PROPOFOL ;  Surgeon: Brice Campi Albino Alu., MD;  Location: [facility] ENDOSCOPY;  Service: Gastroenterology;  Laterality: N/A;   FLEXIBLE SIGMOIDOSCOPY N/A 04/23/2021   Procedure: FLEXIBLE SIGMOIDOSCOPY;  Surgeon: Brice Campi Albino Alu., MD;  Location: [facility] ENDOSCOPY;  Service: Gastroenterology;  Laterality: N/A;   GIVENS CAPSULE STUDY N/A 04/27/2021   Procedure: GIVENS CAPSULE STUDY;  Surgeon: Albertina Hugger, MD;  Location: [facility] ENDOSCOPY;  Service: Gastroenterology;  Laterality: N/A;   IR AORTAGRAM ABDOMINAL SERIALOGRAM  09/15/2020   IR RADIOLOGIST EVAL & MGMT  07/25/2020   IR RADIOLOGIST EVAL & MGMT  08/28/2020   IR RADIOLOGIST EVAL & MGMT  10/22/2020   IR RADIOLOGIST EVAL & MGMT  01/08/2021   IR RADIOLOGIST EVAL & MGMT  04/08/2021   IR RADIOLOGIST EVAL & MGMT  05/07/2021   IR RADIOLOGIST EVAL & MGMT  08/05/2021   IR RADIOLOGIST EVAL & MGMT  03/10/2022   IR  THROMBECT PRIM MECH INIT (INCLU) MOD SED  09/15/2020   IR TRANSCATH PLC STENT 1ST ART NOT LE CV CAR VERT CAR  09/15/2020   IR US  GUIDE VASC ACCESS RIGHT  09/15/2020    Allergies: Patient has no known  allergies.  Medications: Prior to Admission medications   Medication Sig Start Date End Date Taking? Authorizing Provider  acetaminophen  (TYLENOL ) 500 MG tablet Take 2 tablets (1,000 mg total) by mouth every 6 (six) hours as needed for mild pain (or Fever >/= 101). 09/12/20   Marlin Simmonds, PA-C  atorvastatin  (LIPITOR) 10 MG tablet Take 1 tablet (10 mg total) by mouth daily. 01/21/21   Ghimire, Kuber, MD  carvedilol  (COREG ) 25 MG tablet Take 25 mg by mouth 2 (two) times daily. 10/31/20   [provider]  clopidogrel  (PLAVIX ) 75 MG tablet Take 1 tablet (75 mg total) by mouth daily. 09/17/20   Covington, Jamie R, NP  Coenzyme Q10 100 MG capsule Take 100 mg by mouth every morning.    [provider]  Cyanocobalamin  5000 MCG SUBL Place 5,000 mcg under the tongue every morning. 10/28/15   [provider]  diltiazem  (CARDIZEM  CD) 240 MG 24 hr capsule Take 240 mg by mouth every morning. 07/20/20   [provider]  fenofibrate (TRICOR) 145 MG tablet Take 145 mg by mouth every morning. 11/06/20   [provider]  furosemide  (LASIX ) 20 MG tablet Take 20 mg by mouth daily. 03/30/21   [provider]  lisinopril  (ZESTRIL ) 30 MG tablet Take 30 mg by mouth 2 (two) times daily. 07/16/20   [provider]  metFORMIN (GLUCOPHAGE) 500 MG tablet Take 500 mg by mouth 2 (two) times daily. 01/12/21   [provider]  oxyCODONE  (OXY IR/ROXICODONE ) 5 MG immediate release tablet Take 1 tablet (5 mg total) by mouth every 4 (four) hours as needed for moderate pain. 01/21/21   Ghimire, Kuber, MD  pantoprazole  (PROTONIX ) 40 MG tablet Take 40 mg by mouth daily. 04/12/20   [provider]  sertraline  (ZOLOFT ) 100 MG tablet Take 100 mg by mouth every evening. 08/05/20   [provider]     Family History  Problem Relation Age of Onset   Hypertension Father    Heart failure Father    Colon cancer Neg Hx    Esophageal cancer Neg Hx     Inflammatory bowel disease Neg Hx    Liver disease Neg Hx    Pancreatic cancer Neg Hx    Rectal cancer Neg Hx    Stomach cancer Neg Hx     Social History   Socioeconomic History   Marital status: Married    Spouse name: Not on file   Number of children: Not on file   Years of education: Not on file   Highest education level: Not on file  Occupational History   Not on file  Tobacco Use   Smoking status: Former    Types: Cigarettes   Smokeless tobacco: Never  Vaping Use   Vaping status: Never Used  Substance and Sexual Activity   Alcohol use: Not Currently   Drug use: Never   Sexual activity: Not on file  Other Topics Concern   Not on file  Social History Narrative   Not on file   Social Drivers of Health   Financial Resource Strain: Low Risk  (09/10/2021)   Received from [facility]   Overall Financial Resource Strain (CARDIA)  Food Insecurity: Low Risk  (04/26/2023)  Received from [facility]   Hunger Vital Sign    Worried About Running Out of Food in the Last Year: Never true    Ran Out of Food in the Last Year: Never true  Transportation Needs: No Transportation Needs (04/26/2023)   Received from [facility]    In the past 12 months, has lack of reliable transportation kept you from medical appointments, meetings, work or from getting things needed for daily living? : No  Physical Activity: Insufficiently Active (09/10/2021)   Received from [facility]   Exercise Vital Sign  Stress: No Stress Concern Present (09/10/2021)   Received [facility]   [employer] of Occupational Health - Occupational Stress Questionnaire  Social Connections: Unknown (05/01/2022)   Received from [facility]   Social Network    Social Network: Not on file     Vital Signs: There were no vitals taken for this visit.  Physical Exam  Patient is alert, oriented and able to participate fully in the conversation. No apparent discomfort  or distress observed. He appears appropriately dressed.    Imaging: No results found.  Labs:  CBC: No results for input(s): "WBC", "HGB", "HCT", "PLT" in the last 8760 hours.  COAGS: No results for input(s): "INR", "APTT" in the last 8760 hours.  BMP: No results for input(s): "NA", "K", "CL", "CO2", "GLUCOSE", "BUN", "CALCIUM ", "CREATININE", "GFRNONAA", "GFRAA" in the last 8760 hours.  Invalid input(s): "CMP"  LIVER FUNCTION TESTS: No results for input(s): "BILITOT", "AST", "ALT", "ALKPHOS", "PROT", "ALBUMIN" in the last 8760 hours.  Assessment and Plan:  68 year old male with history of chronic mesenteric ischemia status post celiac and superior mesenteric artery stent placement in March 2022 at outside facility. 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 These complications resolved spontaneously however the patient's abdominal pain persisted and the patient developed acute complicated appendicitis, therefore underwent SMA stent recanalization with laser atherectomy and covered stent relining on 09/15/2020. CTA at 18 months post-procedure showed.   Electronically Signed: Fawn Hooks 05/21/2023, 5:10 PM   I spent a total of 25 Minutes in virtual video clinical consultation, greater than 50% of which was counseling/coordinating care for chronic mesenteric ischemia.

## 2023-05-23 ENCOUNTER — Ambulatory Visit
Admission: RE | Admit: 2023-05-23 | Discharge: 2023-05-23 | Disposition: A | Discharge status: Home / Self Care | Source: Ambulatory Visit | Attending: Interventional Radiology

## 2023-05-23 DIAGNOSIS — K551 Chronic vascular disorders of intestine: Secondary | ICD-10-CM

## 2023-05-23 HISTORY — PX: IR RADIOLOGIST EVAL & MGMT: IMG5224

## 2023-07-05 IMAGING — US US ABDOMEN LIMITED
1 series · 14 of 25 positions shown · non-contrast
Comparison: CT angio abdomen and pelvis 01/16/2021

CLINICAL DATA: Acute cholecystitis, small-bowel obstruction

EXAM:
ULTRASOUND ABDOMEN LIMITED RIGHT UPPER QUADRANT

[Series 1: us abdomen limited ruq (liver/gb) · 14 of 86 slices shown]
[im 1/86]
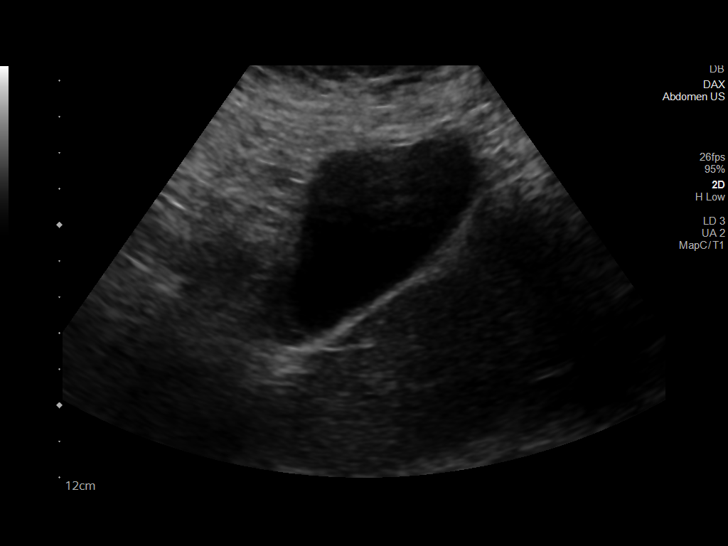
[im 8/86]
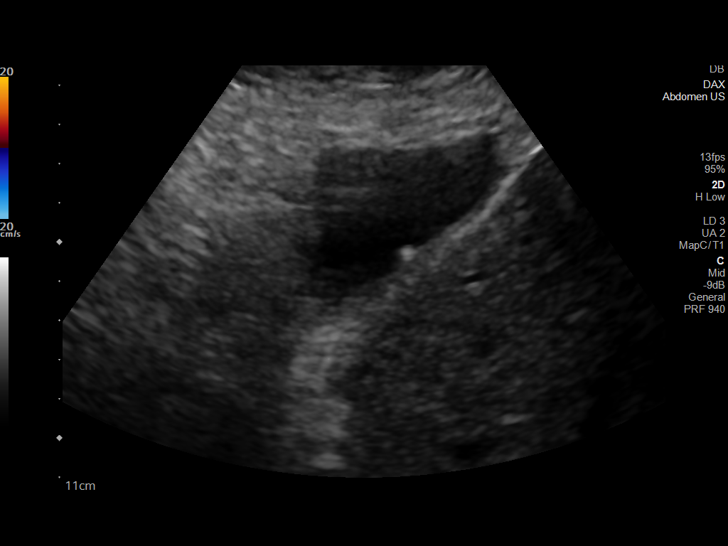
[im 15/86]
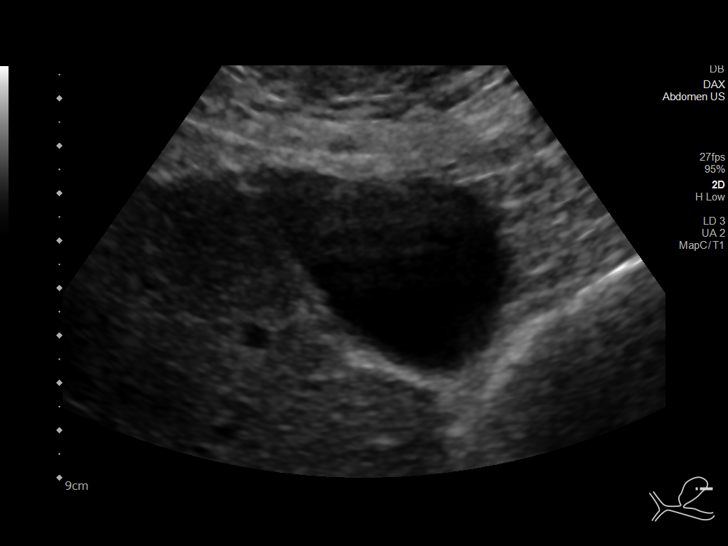
[im 22/86]
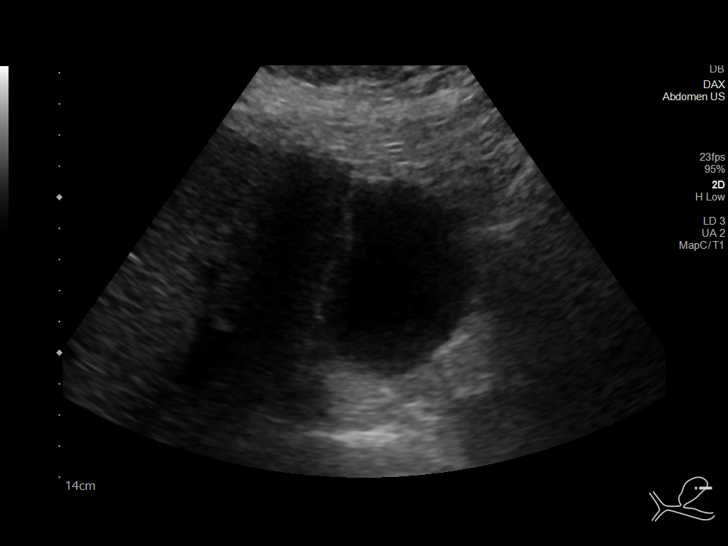
[im 29/86]
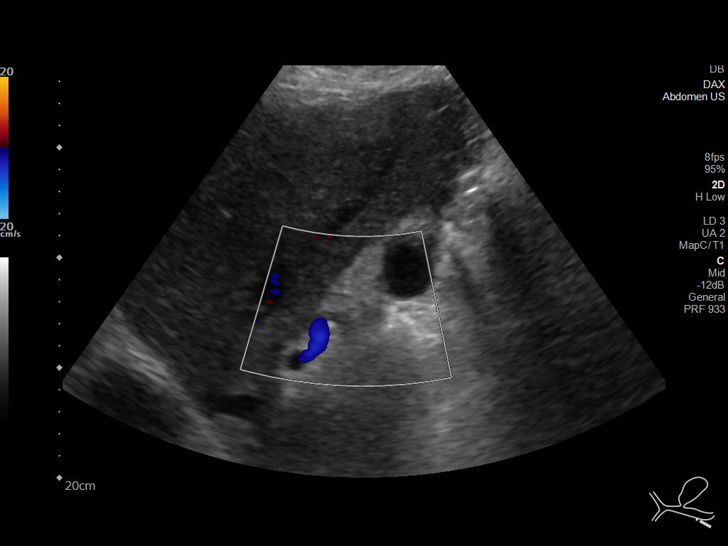
[im 32/86]
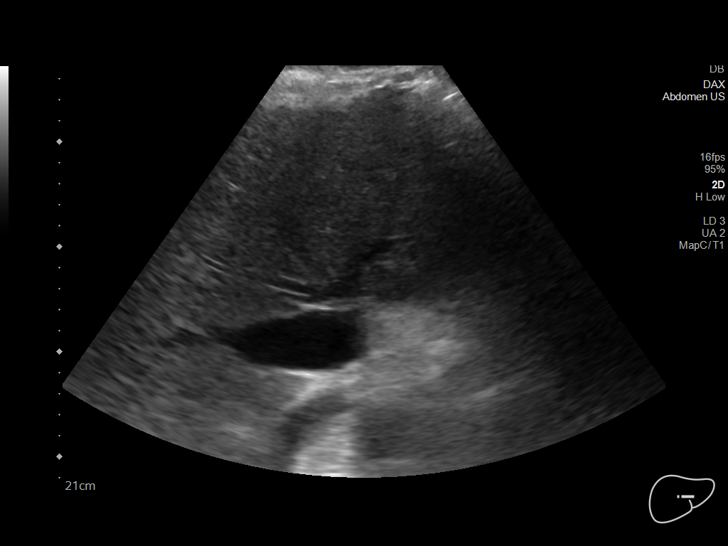
[im 39/86]
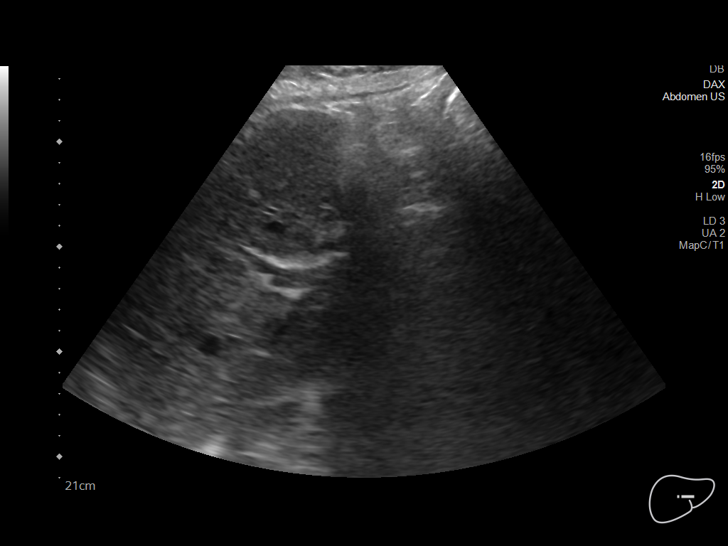
[im 47/86]
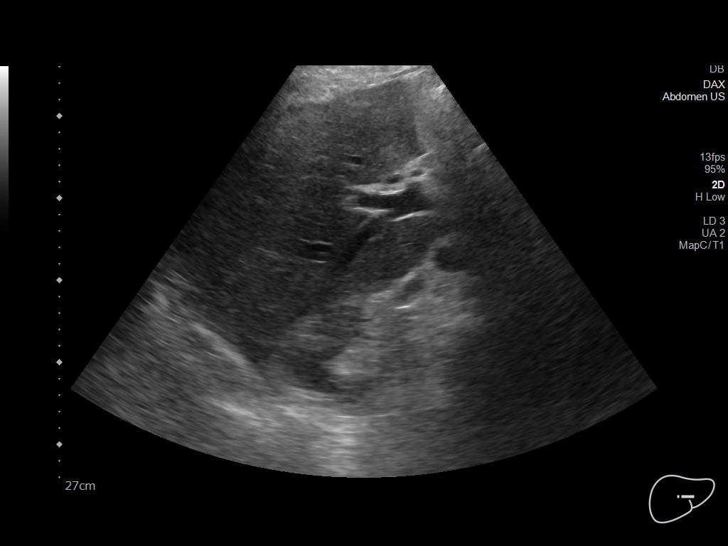
[im 54/86]
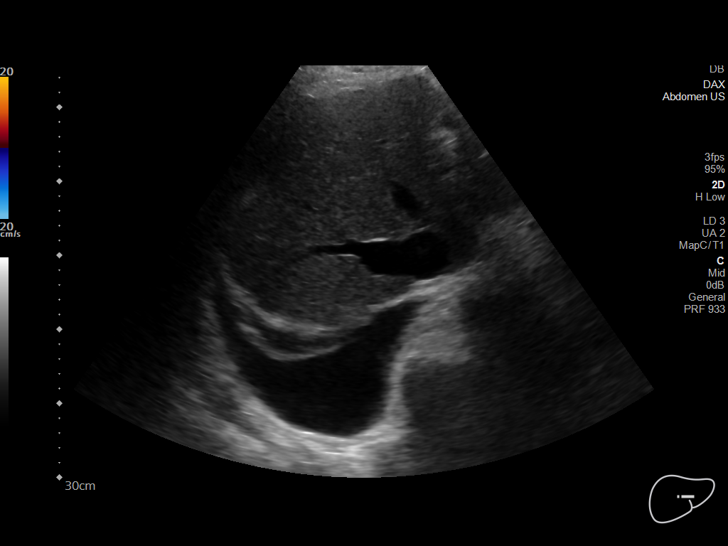
[im 57/86]
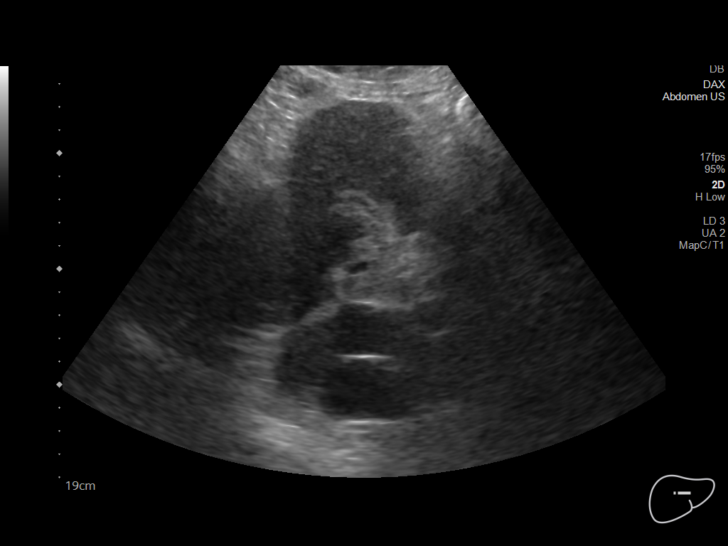
[im 64/86]
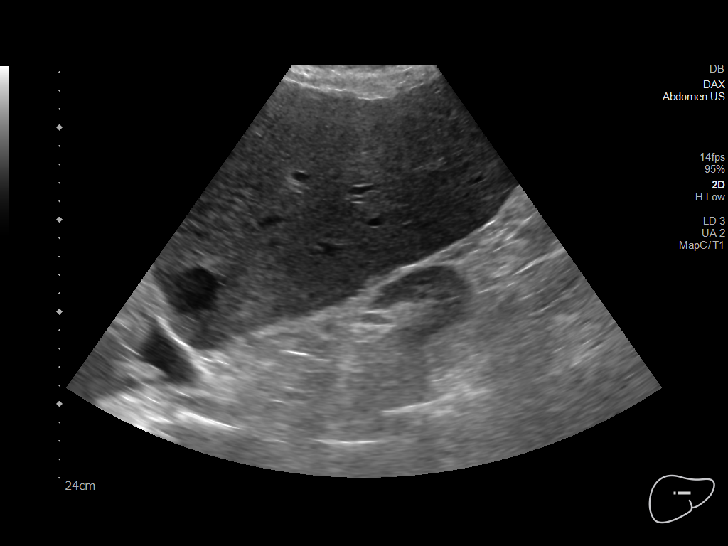
[im 71/86]
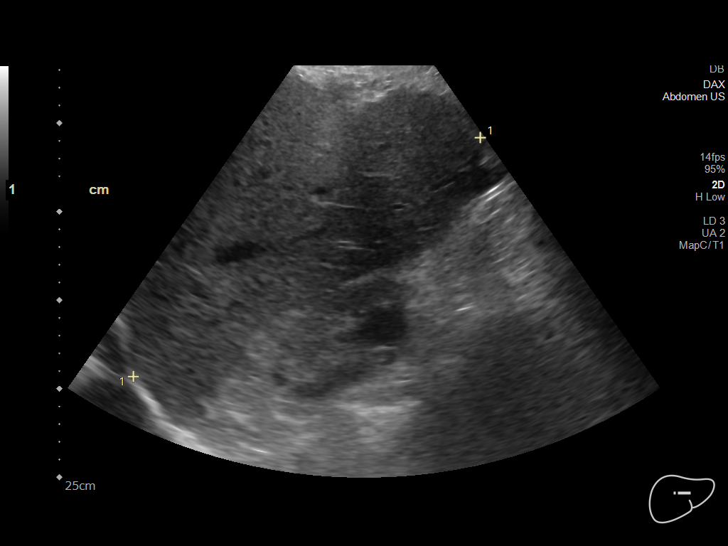
[im 78/86]
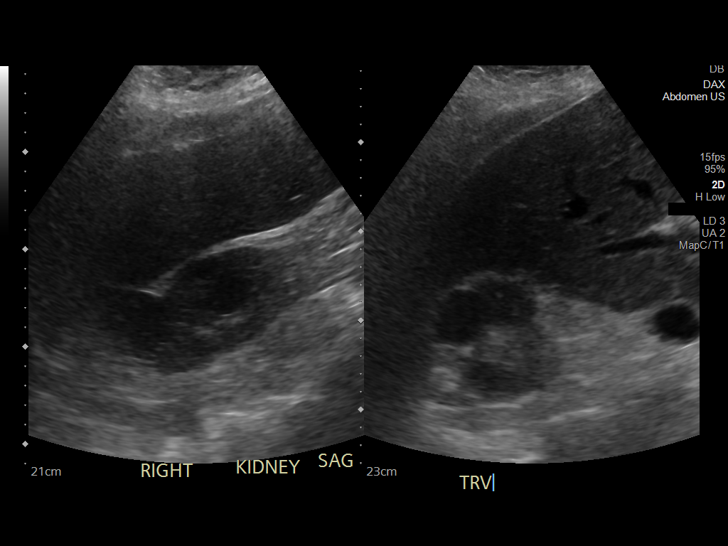
[im 86/86]
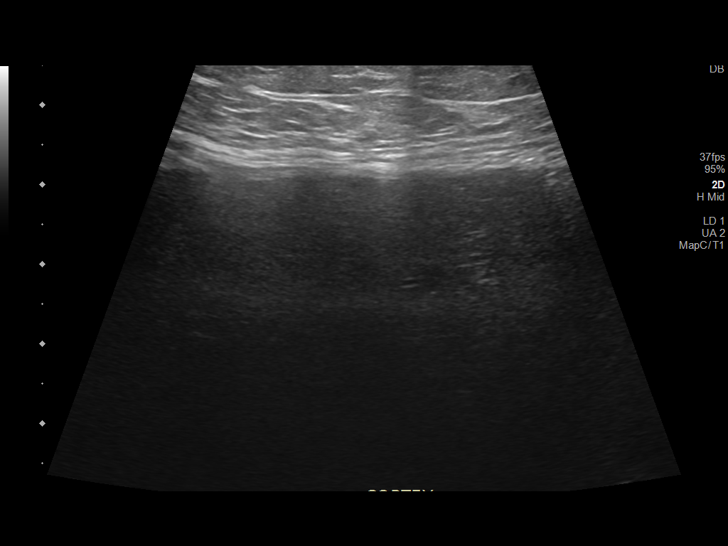

[14 of 25 positions shown; findings below may reference images not displayed]

FINDINGS: Gallbladder:

Normally distended with normal wall thickness. Two tiny nonshadowing
echogenic foci of within gallbladder likely tiny polyps measuring 4
mm and 3 mm in greatest sizes. No definite shadowing calculi,
pericholecystic fluid, or sonographic Murphy sign.

Common bile duct:

Diameter: 4 mm

Liver:

Mildly lobulated contour. Slightly heterogeneous echogenicity. No
discrete hepatic mass. Portal vein is patent on color Doppler
imaging with normal direction of blood flow towards the liver.

Other: Small RIGHT pleural effusion. RIGHT renal cysts as noted on
CT. Trace RIGHT upper quadrant free fluid.
IMPRESSION: Tiny gallbladder polyps.

RIGHT pleural effusion and trace perihepatic ascites.

Slightly lobulated hepatic contour without discrete mass.

## 2023-07-05 IMAGING — CR DG ABDOMEN 1V
2 series · 2 of 2 positions shown · non-contrast
Comparison: CTA abdomen/pelvis 01/16/2021

CLINICAL DATA: Abdominal pain and distension.  Vomiting.

EXAM:
ABDOMEN - 1 VIEW

[abdomen kub (1 of 2)]
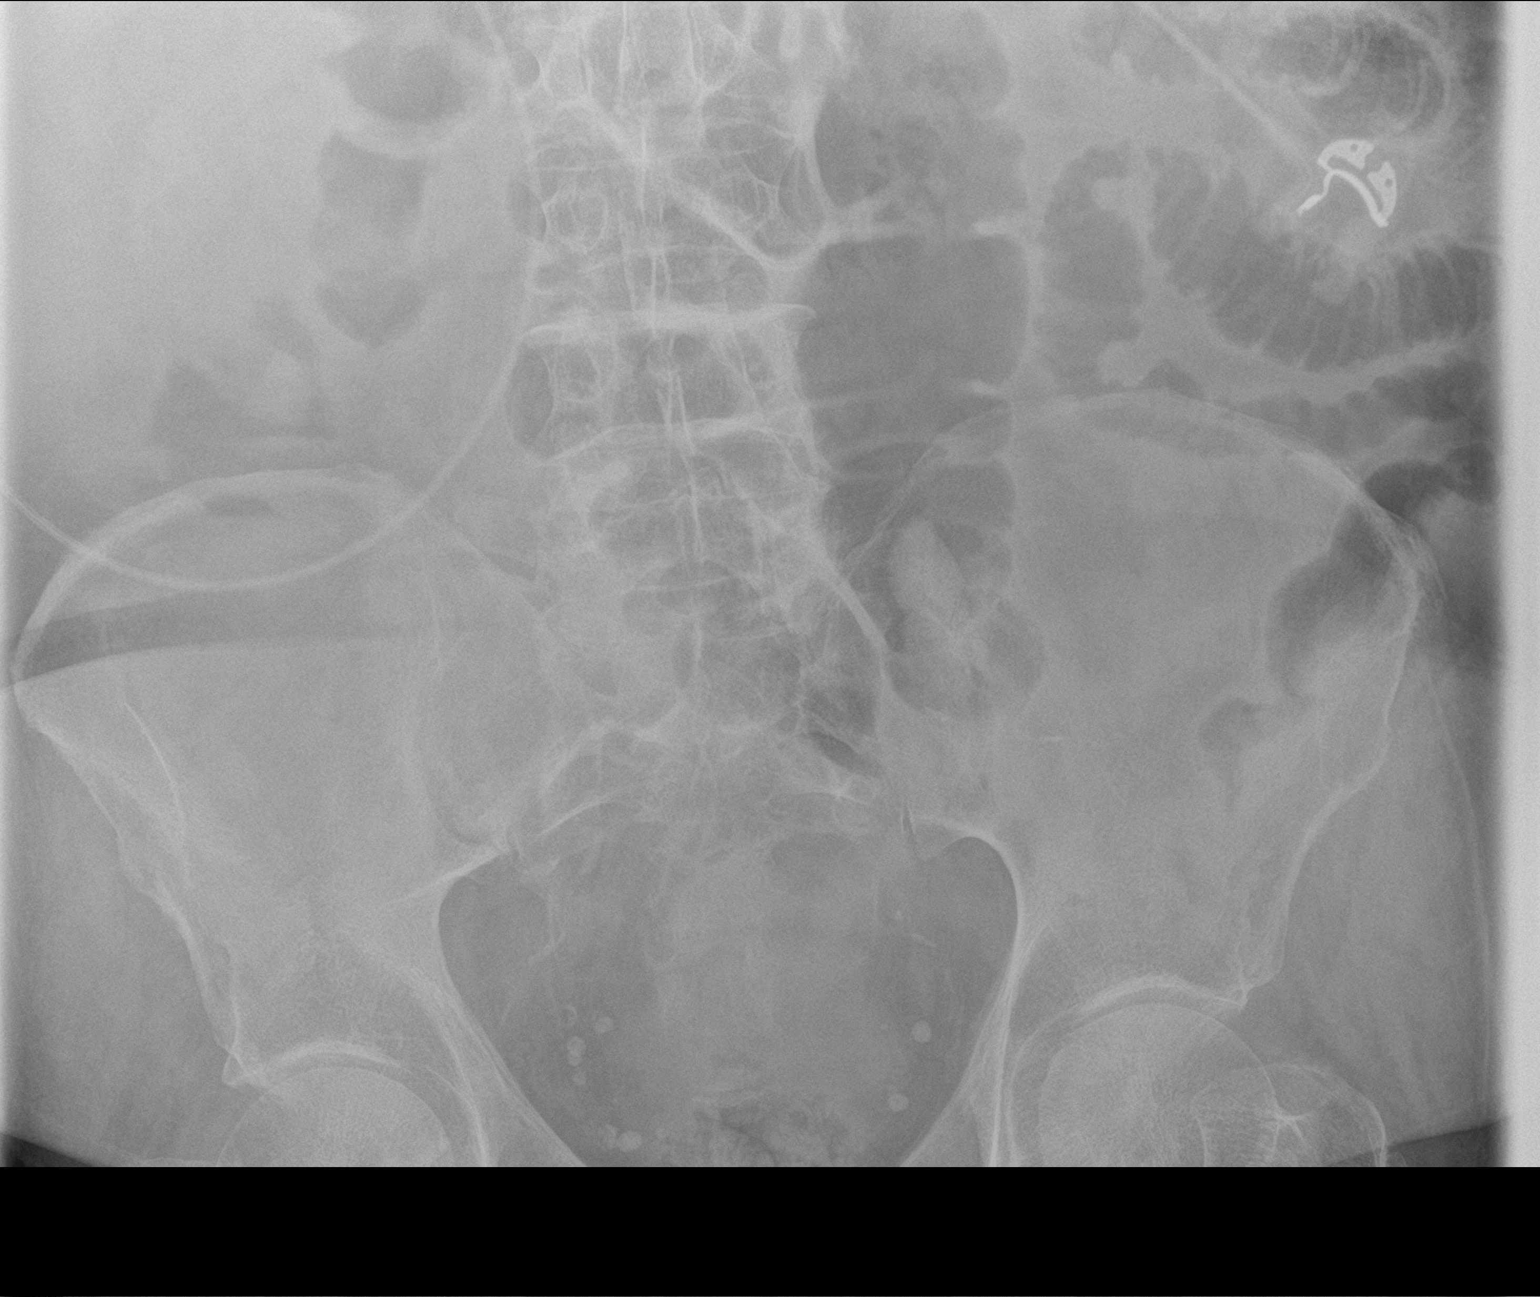

[abdomen kub (2 of 2)]
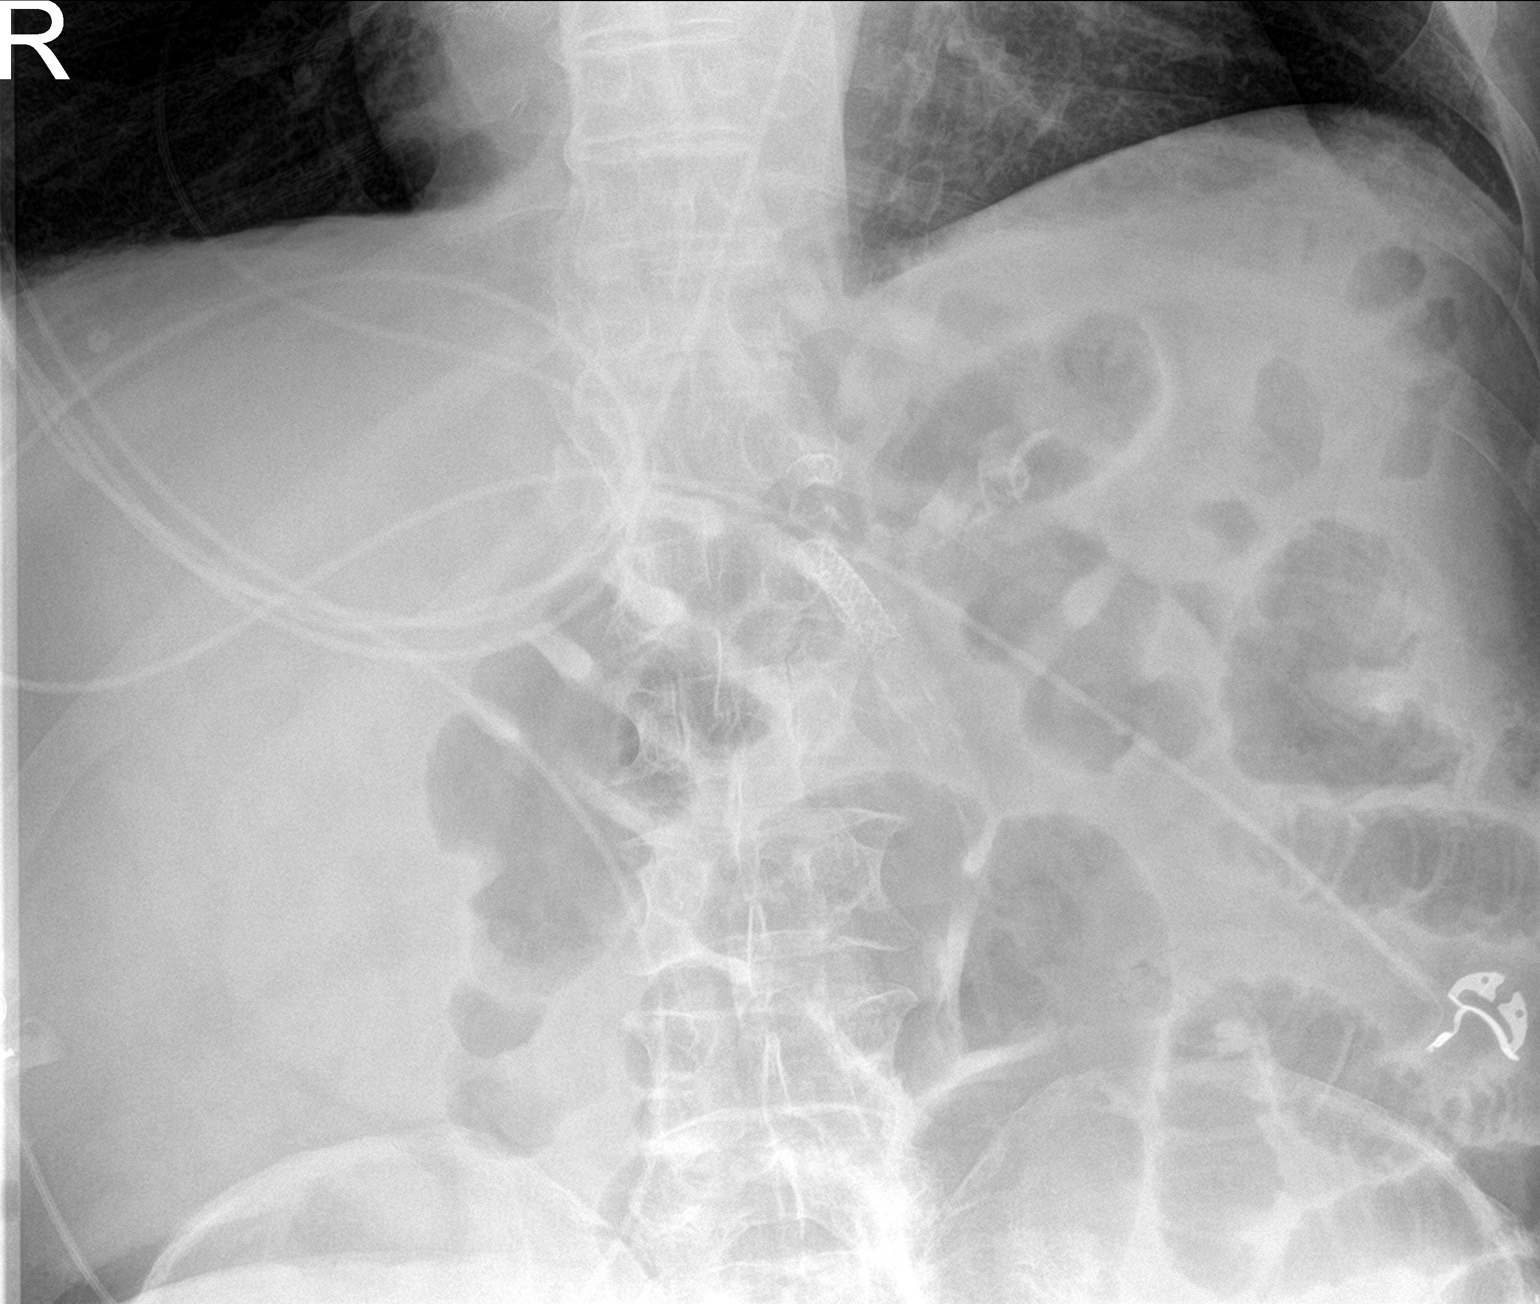

[2 of 2 positions shown; findings below may reference images not displayed]

FINDINGS: Mild gaseous distention of small bowel and colon identified in a
nonspecific pattern. No evidence for overt small bowel obstruction.
Multiple phleboliths are seen over the anatomic pelvis.
IMPRESSION: Nonspecific bowel gas pattern.

## 2023-10-08 IMAGING — CT CT ABD-PELV W/ CM
2 of 5 series · 15 of 46 positions shown, 17 images · IV contrast (agent unspecified)
Comparison: 04/19/2021 CT scan from [REDACTED].

CLINICAL DATA: Pancreatitis, prior drainage of a right upper
quadrant fluid collection.

EXAM:
CT ABDOMEN AND PELVIS WITH CONTRAST
TECHNIQUE: Multidetector CT imaging of the abdomen and pelvis was performed
using the standard protocol following bolus administration of
intravenous contrast.

[Series 3: a/p w/ 5mm · axial · 0.98mm/px · z∈[+622,+1102]mm · 12 of 109 slices shown, 14 images]
[im 7/109  soft-tissue]
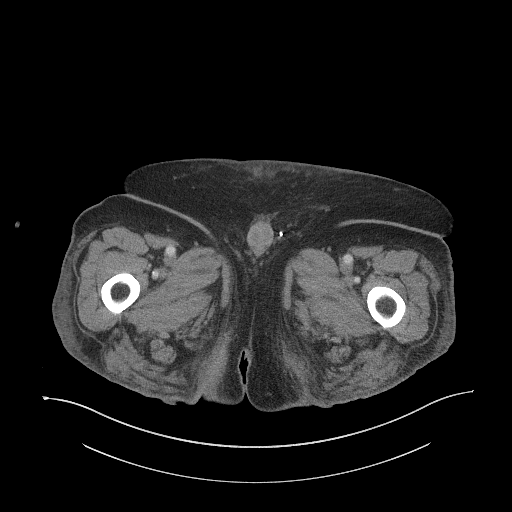
[im 7/109  bone]
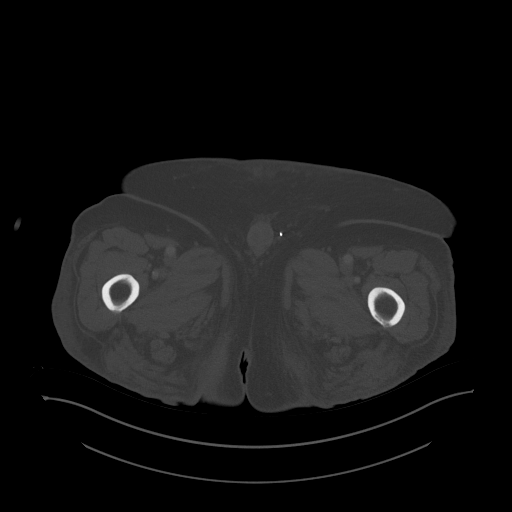
[im 19/109  soft-tissue]
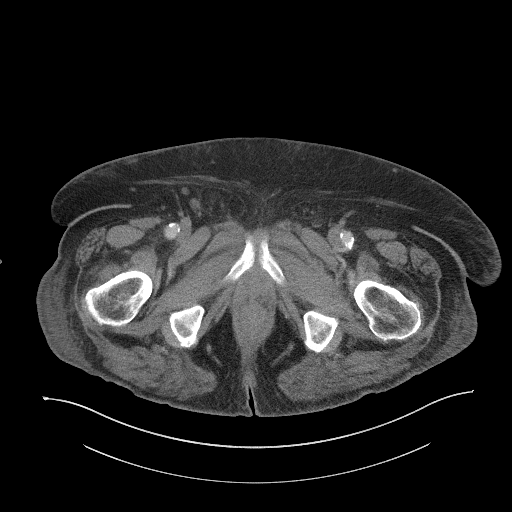
[im 25/109  soft-tissue]
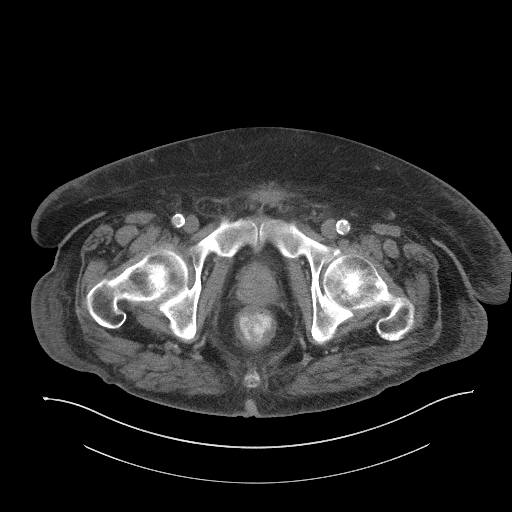
[im 31/109  soft-tissue]
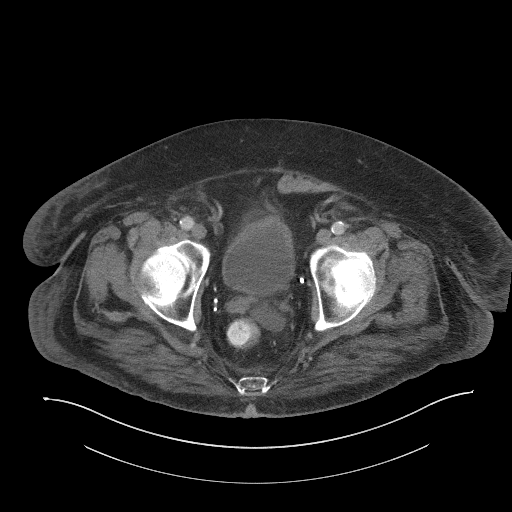
[im 43/109  soft-tissue]
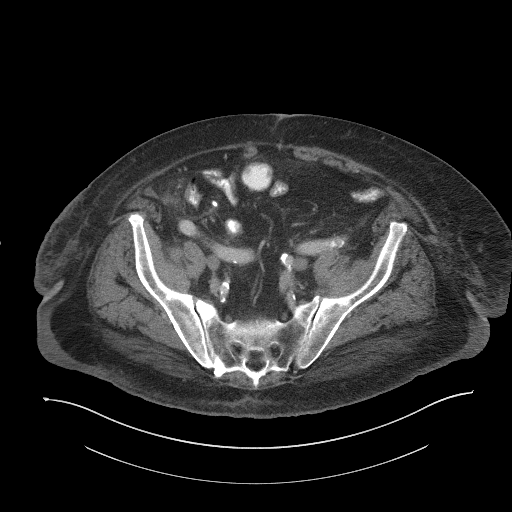
[im 49/109  soft-tissue]
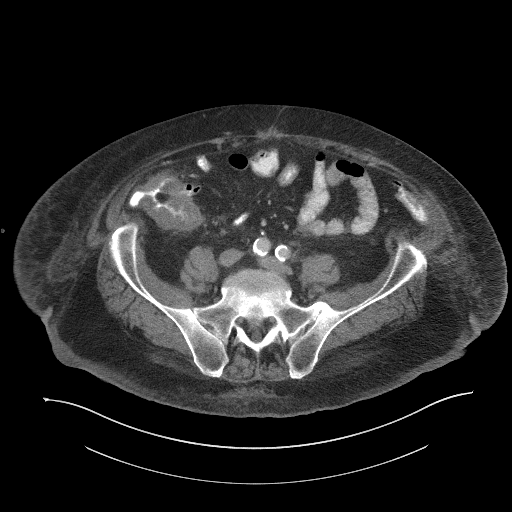
[im 61/109  soft-tissue]
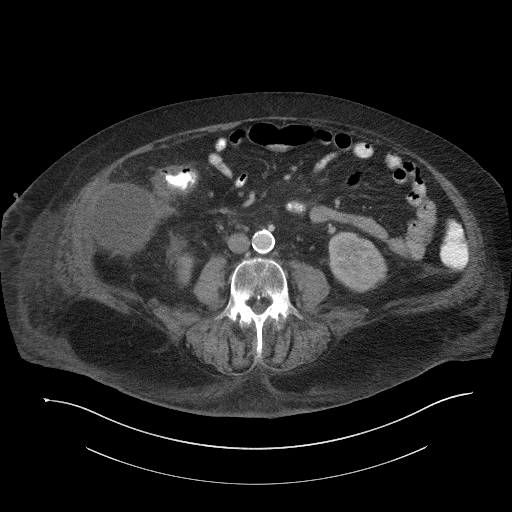
[im 67/109  soft-tissue]
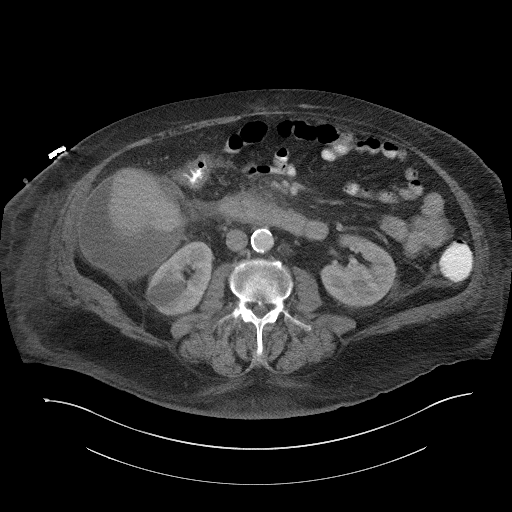
[im 79/109  soft-tissue]
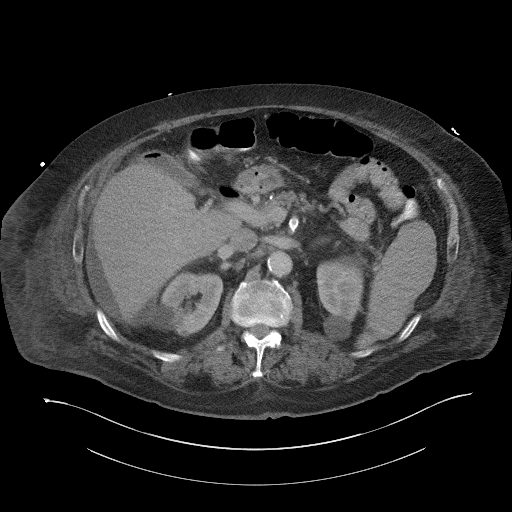
[im 79/109  bone]
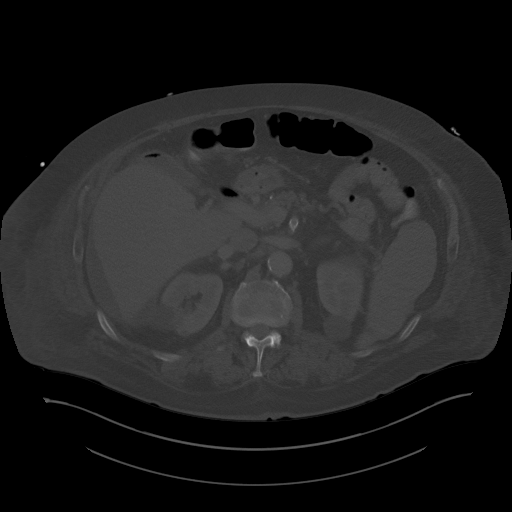
[im 85/109  soft-tissue]
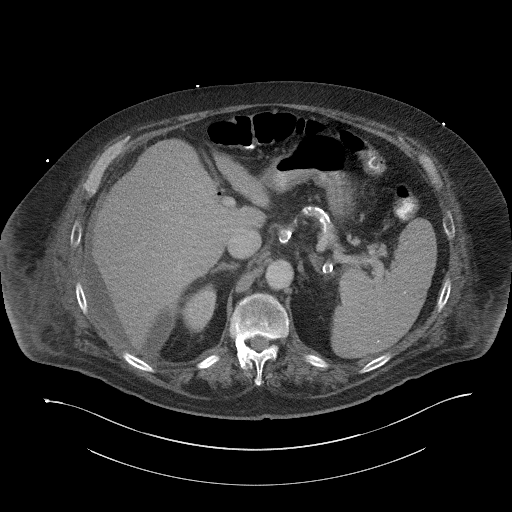
[im 91/109  soft-tissue]
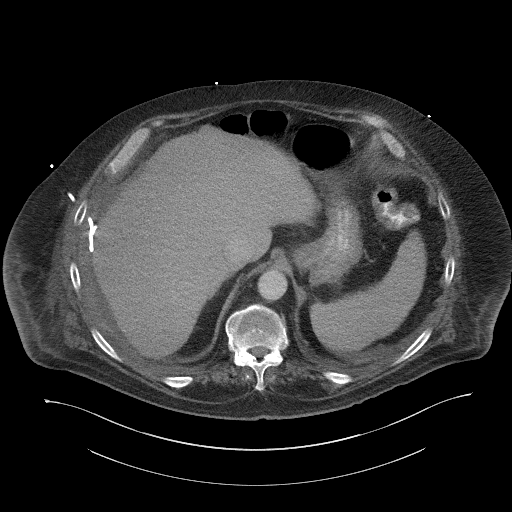
[im 103/109  soft-tissue]
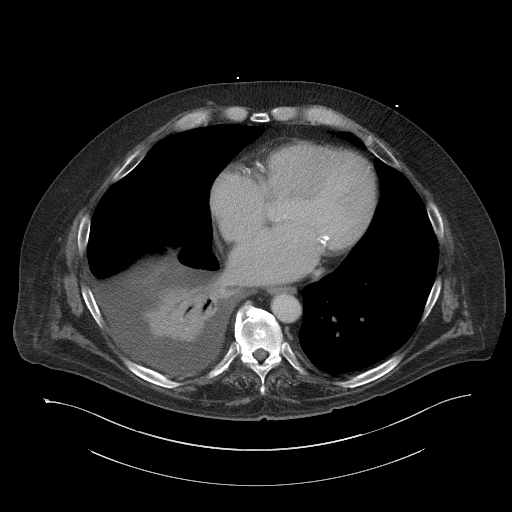

[Series 6: a/p w/ cor · coronal · 1.08mm/px · 3 of 174 slices shown]
[im 58/174  soft-tissue]
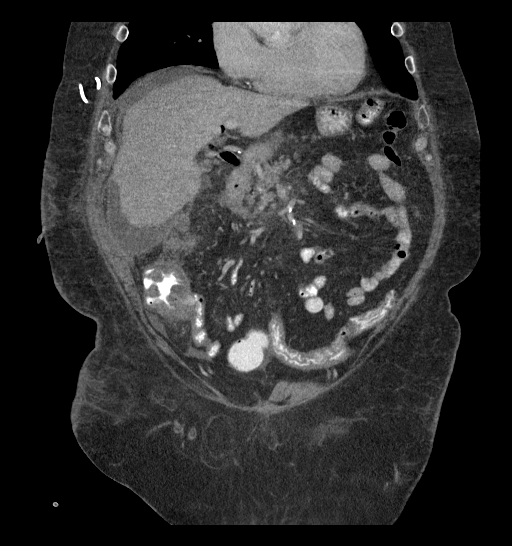
[im 77/174  soft-tissue]
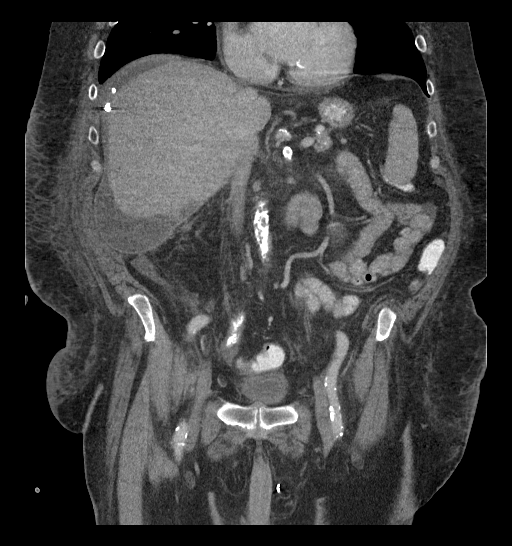
[im 97/174  soft-tissue]
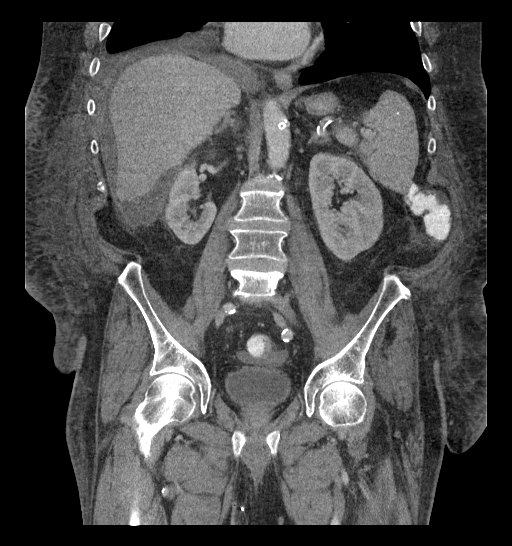

[15 of 46 positions shown; findings below may reference images not displayed]

RADIATION DOSE REDUCTION: This exam was performed according to the
departmental dose-optimization program which includes automated
exposure control, adjustment of the mA and/or kV according to
patient size and/or use of iterative reconstruction technique.

CONTRAST:  100mL OMNIPAQUE IOHEXOL 300 MG/ML  SOLN
FINDINGS: Lower chest: Coronary artery atherosclerotic calcifications.
Moderate right pleural effusion atelectasis of the visualized
portion of the right lower lobe.

Hepatobiliary: New pneumobilia. No focal hepatic lesion identified.
Small amount of gas in the somewhat contracted gallbladder. No focal
hepatic lesion noted.

Reduced flattening of portions of the inferior right hepatic lobe
adjacent to perihepatic fluid collection which has a lobular
inferior margin suggesting at least partial loculation. Reduction in
size of the perihepatic fluid collection noted with placement of a
small caliber pigtail catheter which has a small amount of adjacent
extraluminal gas in the perihepatic fluid collection.

Pancreas: Punctate calcifications in the pancreatic parenchyma
compatible chronic calcific pancreatitis. No findings of pancreatic
abscess, pseudocyst, or pancreatic necrosis.

Spleen: Punctate calcifications in the spleen compatible with old
granulomatous disease.

Adrenals/Urinary Tract: Both adrenal glands appear normal. Stable
renal cysts. The kidneys appear unremarkable.

Stomach/Bowel: Barium versus appendicolith in the appendix. No
findings of appendiceal inflammation. However, there is substantial
wall thickening in the cecum and ascending colon, similar to
04/19/2021. No appreciable pneumatosis.

Vascular/Lymphatic: Substantial atherosclerosis. Celiac and SMA
stents are noted, vascular patency not well assessed on today's non
angiogram CT.

Reproductive: Unremarkable

Other: Subcutaneous edema. Mild mesenteric edema. Mild presacral
edema. These are similar to the recent examination.

Stable small amount of pelvic ascites.

Reduced perihepatic ascites as discussed in the liver section above.

Musculoskeletal: Thoracolumbar spondylosis.
IMPRESSION: 1. Substantially reduced volume of the potentially loculated right
perihepatic fluid collection with interval placement of a small
caliber pigtail drainage catheter. There is a small amount of
extraluminal gas in the perihepatic fluid collection adjacent to the
pigtail catheter. Small amount of pelvic ascites.
2. Continued substantial wall thickening in the ascending colon
favoring inflammation. This colitis is not specific with regard to
etiology by ischemia, inflammatory bowel disease, or infection. The
patient does have markedly severe atherosclerosis with stents in the
SMA and celiac trunk, today's exam was not a CT angiogram and
detailed vascular assessment is not feasible on today's exam.
3. Moderate right pleural effusion with right lower lobe
atelectasis.
4. New pneumobilia.
5. Coronary atherosclerosis.
6. Chronic calcific pancreatitis. No findings of pancreatic abscess,
pseudocyst, or necrosis.
7. Subcutaneous and mesenteric edema.
